# Patient Record
Sex: Female | Born: 1935
Health system: Southern US, Community
[De-identification: ages and names within clinical notes are randomized; demographics above are authoritative.]

## PROBLEM LIST (undated history)

## (undated) DIAGNOSIS — K22719 Barrett's esophagus with dysplasia, unspecified: Secondary | ICD-10-CM

## (undated) DIAGNOSIS — E119 Type 2 diabetes mellitus without complications: Secondary | ICD-10-CM

## (undated) DIAGNOSIS — E079 Disorder of thyroid, unspecified: Secondary | ICD-10-CM

## (undated) DIAGNOSIS — K219 Gastro-esophageal reflux disease without esophagitis: Secondary | ICD-10-CM

## (undated) DIAGNOSIS — I10 Essential (primary) hypertension: Secondary | ICD-10-CM

## (undated) DIAGNOSIS — K220 Achalasia of cardia: Secondary | ICD-10-CM

## (undated) DIAGNOSIS — C649 Malignant neoplasm of unspecified kidney, except renal pelvis: Secondary | ICD-10-CM

## (undated) DIAGNOSIS — N289 Disorder of kidney and ureter, unspecified: Secondary | ICD-10-CM

## (undated) DIAGNOSIS — E785 Hyperlipidemia, unspecified: Secondary | ICD-10-CM

## (undated) DIAGNOSIS — A4902 Methicillin resistant Staphylococcus aureus infection, unspecified site: Secondary | ICD-10-CM

## (undated) HISTORY — DX: Disorder of thyroid, unspecified: E07.9

## (undated) HISTORY — PX: OTHER SURGICAL HISTORY: SHX169

## (undated) HISTORY — PX: ABDOMINAL HYSTERECTOMY: SHX81

## (undated) HISTORY — DX: Barrett's esophagus with dysplasia, unspecified: K22.719

## (undated) HISTORY — DX: Gastro-esophageal reflux disease without esophagitis: K21.9

## (undated) HISTORY — PX: APPENDECTOMY: SHX54

## (undated) HISTORY — DX: Hyperlipidemia, unspecified: E78.5

## (undated) HISTORY — PX: TONSILLECTOMY: SUR1361

## (undated) HISTORY — PX: THYROID EXPLORATION: SHX5280

## (undated) HISTORY — DX: Malignant neoplasm of unspecified kidney, except renal pelvis: C64.9

## (undated) HISTORY — PX: PARATHYROIDECTOMY: SHX19

## (undated) HISTORY — PX: HIATAL HERNIA REPAIR: SHX195

## (undated) HISTORY — DX: Achalasia of cardia: K22.0

---

## 2004-04-23 ENCOUNTER — Ambulatory Visit: Payer: Self-pay

## 2009-06-17 ENCOUNTER — Ambulatory Visit: Payer: Self-pay | Admitting: Family Medicine

## 2009-06-25 ENCOUNTER — Ambulatory Visit: Payer: Self-pay | Admitting: Family Medicine

## 2013-04-10 ENCOUNTER — Ambulatory Visit: Payer: Self-pay | Admitting: Unknown Physician Specialty

## 2013-04-12 LAB — PATHOLOGY REPORT

## 2013-05-11 DIAGNOSIS — R1310 Dysphagia, unspecified: Secondary | ICD-10-CM | POA: Insufficient documentation

## 2013-05-31 ENCOUNTER — Ambulatory Visit: Payer: Self-pay | Admitting: Gastroenterology

## 2013-07-04 ENCOUNTER — Ambulatory Visit: Payer: Self-pay | Admitting: Family Medicine

## 2014-06-06 ENCOUNTER — Emergency Department: Payer: PPO

## 2014-06-06 ENCOUNTER — Encounter: Payer: Self-pay | Admitting: Urgent Care

## 2014-06-06 ENCOUNTER — Emergency Department
Admission: EM | Admit: 2014-06-06 | Discharge: 2014-06-06 | Disposition: A | Discharge status: Home / Self Care | Payer: PPO | Attending: Student | Admitting: Student

## 2014-06-06 DIAGNOSIS — R109 Unspecified abdominal pain: Secondary | ICD-10-CM | POA: Diagnosis not present

## 2014-06-06 DIAGNOSIS — I10 Essential (primary) hypertension: Secondary | ICD-10-CM | POA: Diagnosis not present

## 2014-06-06 DIAGNOSIS — M549 Dorsalgia, unspecified: Secondary | ICD-10-CM | POA: Diagnosis not present

## 2014-06-06 DIAGNOSIS — E119 Type 2 diabetes mellitus without complications: Secondary | ICD-10-CM | POA: Insufficient documentation

## 2014-06-06 HISTORY — DX: Essential (primary) hypertension: I10

## 2014-06-06 HISTORY — DX: Type 2 diabetes mellitus without complications: E11.9

## 2014-06-06 LAB — URINALYSIS COMPLETE WITH MICROSCOPIC (ARMC ONLY)
BILIRUBIN URINE: NEGATIVE
Bacteria, UA: NONE SEEN
GLUCOSE, UA: NEGATIVE mg/dL
Hgb urine dipstick: NEGATIVE
KETONES UR: NEGATIVE mg/dL
Leukocytes, UA: NEGATIVE
Nitrite: NEGATIVE
PH: 7 (ref 5.0–8.0)
PROTEIN: NEGATIVE mg/dL
SPECIFIC GRAVITY, URINE: 1.015 (ref 1.005–1.030)

## 2014-06-06 MED ORDER — TRAMADOL HCL 50 MG PO TABS
50.0000 mg | ORAL_TABLET | Freq: Once | ORAL | Status: AC
  Administered 2014-06-06: 50 mg via ORAL

## 2014-06-06 MED ORDER — TRAMADOL HCL 50 MG PO TABS: 50.0000 mg | ORAL_TABLET | Freq: Four times a day (QID) | ORAL | Status: DC | PRN

## 2014-06-06 MED ORDER — CYCLOBENZAPRINE HCL 5 MG PO TABS: 5.0000 mg | ORAL_TABLET | Freq: Three times a day (TID) | ORAL | Status: AC | PRN

## 2014-06-06 MED ORDER — TRAMADOL HCL 50 MG PO TABS
ORAL_TABLET | ORAL | Status: AC
  Filled 2014-06-06: qty 1

## 2014-06-06 NOTE — Discharge Instructions (Signed)
Flank Pain °Flank pain refers to pain that is located on the side of the body between the upper abdomen and the back. The pain may occur over a short period of time (acute) or may be long-term or reoccurring (chronic). It may be mild or severe. Flank pain can be caused by many things. °CAUSES  °Some of the more common causes of flank pain include: °· Muscle strains.   °· Muscle spasms.   °· A disease of your spine (vertebral disk disease).   °· A lung infection (pneumonia).   °· Fluid around your lungs (pulmonary edema).   °· A kidney infection.   °· Kidney stones.   °· A very painful skin rash caused by the chickenpox virus (shingles).   °· Gallbladder disease.   °HOME CARE INSTRUCTIONS  °Home care will depend on the cause of your pain. In general, °· Rest as directed by your caregiver. °· Drink enough fluids to keep your urine clear or pale yellow. °· Only take over-the-counter or prescription medicines as directed by your caregiver. Some medicines may help relieve the pain. °· Tell your caregiver about any changes in your pain. °· Follow up with your caregiver as directed. °SEEK IMMEDIATE MEDICAL CARE IF:  °· Your pain is not controlled with medicine.   °· You have new or worsening symptoms. °· Your pain increases.   °· You have abdominal pain.   °· You have shortness of breath.   °· You have persistent nausea or vomiting.   °· You have swelling in your abdomen.   °· You feel faint or pass out.   °· You have blood in your urine. °· You have a fever or persistent symptoms for more than 2-3 days. °· You have a fever and your symptoms suddenly get worse. °MAKE SURE YOU:  °· Understand these instructions. °· Will watch your condition. °· Will get help right away if you are not doing well or get worse. °Document Released: 02/12/2005 Document Revised: 09/16/2011 Document Reviewed: 08/06/2011 °ExitCare® Patient Information ©2015 [employer]. This information is not intended to replace advice given to you by your  health care provider. Make sure you discuss any questions you have with your health care provider. ° °

## 2014-06-06 NOTE — ED Notes (Signed)
Patient presents with c/o RIGHT mid to lower back pain that has been going on for several days, worse last night around 1700. Patient denies N/V/D/F and urinary symptoms. Patient advises that pain is worsened with deep inspiration.

## 2014-06-06 NOTE — ED Notes (Signed)
Past few days c/o right lateral back, hurts to breathe, sharp like pain,no urinary symptoms, lungs clear

## 2014-06-06 NOTE — ED Provider Notes (Signed)
[facility] Emergency Department Provider Note  ____________________________________________  Time seen: Approximately 7:33 AM  I have reviewed the triage vital signs and the nursing notes.   HISTORY  Chief Complaint Back Pain    HPI Deanna Bird is a 81 y.o. female plane of increasing left flank pain for 3 days. Patient stated the pain increase around 7:00 last night she denies any urinary complaints. Denies any fever patient stated pain increases with deep respirations. Patient denies any dyspnea. Patient is rating her pain as a 8/10.   Past Medical History  Diagnosis Date  . Diabetes mellitus without complication   . Hypertension     There are no active problems to display for this patient.   Past Surgical History  Procedure Laterality Date  . Abdominal hysterectomy      Current Outpatient Rx  Name  Route  Sig  Dispense  Refill  . cyclobenzaprine (FLEXERIL) 5 MG tablet   Oral   Take 1 tablet (5 mg total) by mouth every 8 (eight) hours as needed for muscle spasms.   15 tablet   1   . traMADol (ULTRAM) 50 MG tablet   Oral   Take 1 tablet (50 mg total) by mouth every 6 (six) hours as needed for moderate pain.   12 tablet   0     Allergies Review of patient's allergies indicates no known allergies.  No family history on file.  Social History History  Substance Use Topics  . Smoking status: Never Smoker   . Smokeless tobacco: Not on file  . Alcohol Use: No    Review of Systems Constitutional: No fever/chills Eyes: No visual changes. ENT: No sore throat. Cardiovascular: Denies chest pain. Respiratory: Denies shortness of breath. Gastrointestinal: No abdominal pain.  No nausea, no vomiting.  No diarrhea.  No constipation. Genitourinary: Negative for dysuria. Musculoskeletal: Flank pain Skin: Negative for rash. Neurological: Negative for headaches, focal weakness or numbness. Endocrine:Diabetes and  hypertension Hematological/Lymphatic:  10-point ROS otherwise negative.  ____________________________________________   PHYSICAL EXAM:  VITAL SIGNS: ED Triage Vitals  Enc Vitals Group     BP 06/06/14 0551 158/69 mmHg     Pulse Rate 06/06/14 0551 70     Resp 06/06/14 0551 18     Temp 06/06/14 0551 97.6 F (36.4 C)     Temp Source 06/06/14 0551 Oral     SpO2 06/06/14 0551 98 %     Weight 06/06/14 0551 160 lb (72.576 kg)     Height 06/06/14 0551 5\' 4"  (1.626 m)     Head Cir --      Peak Flow --      Pain Score 06/06/14 0551 8     Pain Loc --      Pain Edu? --      Excl. in [place]? --     Constitutional: Alert and oriented. On his distress. Eyes: Conjunctivae are normal. PERRL. EOMI. Head: Atraumatic. Nose: No congestion/rhinnorhea. Mouth/Throat: Mucous membranes are moist.  Oropharynx non-erythematous. Neck: No stridor. No cervical deformity for nuchal range of motion nontender palpation. Hematological/Lymphatic/Immunilogical: No cervical lymphadenopathy. Cardiovascular: Normal rate, regular rhythm. Grossly normal heart sounds.  Good peripheral circulation. Respiratory: Normal respiratory effort.  No retractions. Lungs CTAB. Guarding with deep inspirations. Gastrointestinal: Soft and nontender. No distention. No abdominal bruits. No CVA tenderness. Musculoskeletal: No lower extremity tenderness nor edema.  No joint effusions. Guarding with palpation of the right CVA area.  Neurologic:  Normal speech and language. No gross  focal neurologic deficits are appreciated. Speech is normal. No gait instability. Skin:  Skin is warm, dry and intact. No rash noted. Psychiatric: Mood and affect are normal. Speech and behavior are normal.  ____________________________________________   LABS (all labs ordered are listed, but only abnormal results are displayed)  Labs Reviewed  URINALYSIS COMPLETEWITH MICROSCOPIC ([place] ONLY) - Abnormal; Notable for the following:    Color, Urine YELLOW  (*)    APPearance CLEAR (*)    Squamous Epithelial / LPF 0-5 (*)    All other components within normal limits   ____________________________________________  EKG   ____________________________________________  RADIOLOGY   __________ no acute findings on chest x-ray __________________________________   PROCEDURES  Procedure(s) performed: None  Critical Care performed: No  ____________________________________________   INITIAL IMPRESSION / ASSESSMENT AND PLAN / ED COURSE  Pertinent labs & imaging results that were available during my care of the patient were reviewed by me and considered in my medical decision making (see chart for details).  Right flank pain ____________________________________________   FINAL CLINICAL IMPRESSION(S) / ED DIAGNOSES  Final diagnoses:  Right flank pain      Sable Feil, PA-C 06/06/14 0383  Joanne Gavel, MD 06/06/14 1444

## 2014-07-04 ENCOUNTER — Other Ambulatory Visit: Payer: Self-pay | Admitting: Family Medicine

## 2014-07-18 ENCOUNTER — Ambulatory Visit (INDEPENDENT_AMBULATORY_CARE_PROVIDER_SITE_OTHER): Payer: PPO | Admitting: Family Medicine

## 2014-07-18 ENCOUNTER — Encounter: Payer: Self-pay | Admitting: Family Medicine

## 2014-07-18 VITALS — BP 133/73 | HR 71 | Temp 98.1°F | Ht 62.6 in | Wt 164.0 lb

## 2014-07-18 DIAGNOSIS — E119 Type 2 diabetes mellitus without complications: Secondary | ICD-10-CM | POA: Insufficient documentation

## 2014-07-18 DIAGNOSIS — I10 Essential (primary) hypertension: Secondary | ICD-10-CM | POA: Diagnosis not present

## 2014-07-18 DIAGNOSIS — R946 Abnormal results of thyroid function studies: Secondary | ICD-10-CM | POA: Insufficient documentation

## 2014-07-18 LAB — BAYER DCA HB A1C WAIVED: HB A1C (BAYER DCA - WAIVED): 6 % (ref <7.0)

## 2014-07-18 MED ORDER — GLIMEPIRIDE 1 MG PO TABS: 2.0000 mg | ORAL_TABLET | Freq: Every day | ORAL | Status: DC

## 2014-07-18 MED ORDER — METOPROLOL SUCCINATE ER 100 MG PO TB24: 100.0000 mg | ORAL_TABLET | Freq: Every day | ORAL | Status: DC

## 2014-07-18 MED ORDER — LOSARTAN POTASSIUM-HCTZ 100-25 MG PO TABS: 1.0000 | ORAL_TABLET | Freq: Every day | ORAL | Status: DC

## 2014-07-18 MED ORDER — FENOFIBRIC ACID 105 MG PO TABS: 1.0000 | ORAL_TABLET | Freq: Every day | ORAL | Status: DC

## 2014-07-18 NOTE — Assessment & Plan Note (Signed)
The current medical regimen is effective;  continue present plan and medications.  

## 2014-07-18 NOTE — Progress Notes (Signed)
BP 133/73 mmHg  Pulse 71  Temp(Src) 98.1 F (36.7 C)  Ht 5' 2.6" (1.59 m)  Wt 164 lb (74.39 kg)  BMI 29.43 kg/m2  SpO2 99%   Subjective:    Patient ID: Deanna Bird, female    DOB: 06-03-1928, 81 y.o.   MRN: [id number]  HPI: Deanna Bird is a 81 y.o. female  Chief Complaint  Patient presents with  . Diabetes  . TSH bloodwork  Doing well with all medicine. Takes everyday with no side effects. Stable from last visit. Medical problems reviewed and stable No low glu spells No thyroid sx BP doing well  Relevant past medical, surgical, family and social history reviewed and updated as indicated. Interim medical history since our last visit reviewed. Allergies and medications reviewed and updated.  Review of Systems  Constitutional: Negative.   Respiratory: Negative.   Cardiovascular: Negative.     Per HPI unless specifically indicated above     Objective:    BP 133/73 mmHg  Pulse 71  Temp(Src) 98.1 F (36.7 C)  Ht 5' 2.6" (1.59 m)  Wt 164 lb (74.39 kg)  BMI 29.43 kg/m2  SpO2 99%  Wt Readings from Last 3 Encounters:  07/18/14 164 lb (74.39 kg)  04/03/14 168 lb (76.204 kg)  06/06/14 160 lb (72.576 kg)    Physical Exam  Constitutional: She is oriented to person, place, and time. She appears well-developed and well-nourished. No distress.  HENT:  Head: Normocephalic and atraumatic.  Right Ear: Hearing normal.  Left Ear: Hearing normal.  Nose: Nose normal.  Eyes: Conjunctivae and lids are normal. Right eye exhibits no discharge. Left eye exhibits no discharge. No scleral icterus.  Cardiovascular: Normal rate, regular rhythm and normal heart sounds.   Pulmonary/Chest: Effort normal and breath sounds normal. No respiratory distress.  Musculoskeletal: Normal range of motion.  Neurological: She is alert and oriented to person, place, and time.  Skin: Skin is intact. No rash noted.  Psychiatric: She has a normal mood and affect. Her speech is normal and  behavior is normal. Judgment and thought content normal. Cognition and memory are normal.    Results for orders placed or performed during the hospital encounter of 06/06/14  Urinalysis complete, with microscopic [facility])  Result Value Ref Range   Color, Urine YELLOW (A) YELLOW   APPearance CLEAR (A) CLEAR   Glucose, UA NEGATIVE NEGATIVE mg/dL   Bilirubin Urine NEGATIVE NEGATIVE   Ketones, ur NEGATIVE NEGATIVE mg/dL   Specific Gravity, Urine 1.015 1.005 - 1.030   Hgb urine dipstick NEGATIVE NEGATIVE   pH 7.0 5.0 - 8.0   Protein, ur NEGATIVE NEGATIVE mg/dL   Nitrite NEGATIVE NEGATIVE   Leukocytes, UA NEGATIVE NEGATIVE   RBC / HPF 0-5 0 - 5 RBC/hpf   WBC, UA 0-5 0 - 5 WBC/hpf   Bacteria, UA NONE SEEN NONE SEEN   Squamous Epithelial / LPF 0-5 (A) NONE SEEN   Mucous PRESENT       Assessment & Plan:   Problem List Items Addressed This Visit      Cardiovascular and Mediastinum   Hypertension    The current medical regimen is effective;  continue present plan and medications.       Relevant Medications   metoprolol succinate (TOPROL-XL) 100 MG 24 hr tablet   losartan-hydrochlorothiazide (HYZAAR) 100-25 MG per tablet   Fenofibric Acid 105 MG TABS     Endocrine   Diabetes mellitus without complication - Primary    The  current medical regimen is effective;  continue present plan and medications.       Relevant Medications   losartan-hydrochlorothiazide (HYZAAR) 100-25 MG per tablet   glimepiride (AMARYL) 1 MG tablet   Fenofibric Acid 105 MG TABS   Other Relevant Orders   Bayer DCA Hb A1c Waived     Other   Nonspecific abnormal results of thyroid function study   Relevant Orders   TSH       Follow up plan: Return in about 3 months (around 10/18/2014), or if symptoms worsen or fail to improve, for recheck BMP, lipids, alt, ast, A1C.

## 2014-07-19 LAB — TSH: TSH: 4.66 u[IU]/mL — AB (ref 0.450–4.500)

## 2014-07-30 ENCOUNTER — Telehealth: Payer: Self-pay

## 2014-07-30 NOTE — Telephone Encounter (Signed)
Reviewed with patient

## 2014-07-30 NOTE — Telephone Encounter (Signed)
Pt would like Deanna Bird to call her with her lab results. Thanks.

## 2014-07-30 NOTE — Telephone Encounter (Signed)
TSH is just fine

## 2014-11-19 ENCOUNTER — Ambulatory Visit (INDEPENDENT_AMBULATORY_CARE_PROVIDER_SITE_OTHER): Payer: PPO | Admitting: Family Medicine

## 2014-11-19 ENCOUNTER — Encounter: Payer: Self-pay | Admitting: Family Medicine

## 2014-11-19 VITALS — BP 135/73 | HR 60 | Temp 98.1°F | Ht 63.0 in | Wt 167.0 lb

## 2014-11-19 DIAGNOSIS — I10 Essential (primary) hypertension: Secondary | ICD-10-CM

## 2014-11-19 DIAGNOSIS — Z23 Encounter for immunization: Secondary | ICD-10-CM

## 2014-11-19 DIAGNOSIS — E785 Hyperlipidemia, unspecified: Secondary | ICD-10-CM

## 2014-11-19 DIAGNOSIS — E119 Type 2 diabetes mellitus without complications: Secondary | ICD-10-CM | POA: Diagnosis not present

## 2014-11-19 LAB — LP+ALT+AST PICCOLO, WAIVED
ALT (SGPT) Piccolo, Waived: 22 U/L (ref 10–47)
AST (SGOT) PICCOLO, WAIVED: 29 U/L (ref 11–38)
Chol/HDL Ratio Piccolo,Waive: 4.8 mg/dL
Cholesterol Piccolo, Waived: 135 mg/dL (ref <200)
HDL CHOL PICCOLO, WAIVED: 28 mg/dL — AB (ref >59)
LDL CHOL CALC PICCOLO WAIVED: 49 mg/dL (ref <100)
Triglycerides Piccolo,Waived: 287 mg/dL — ABNORMAL HIGH (ref <150)
VLDL Chol Calc Piccolo,Waive: 57 mg/dL — ABNORMAL HIGH (ref <30)

## 2014-11-19 LAB — MICROALBUMIN, URINE WAIVED
CREATININE, URINE WAIVED: 200 mg/dL (ref 10–300)
MICROALB, UR WAIVED: 10 mg/L (ref 0–19)
Microalb/Creat Ratio: <30 mg/g (ref <30)

## 2014-11-19 LAB — BAYER DCA HB A1C WAIVED: HB A1C (BAYER DCA - WAIVED): 6.1 % (ref <7.0)

## 2014-11-19 NOTE — Assessment & Plan Note (Signed)
Discuss elevated blood pressure  Discussed diet exercise nutrition modest salt Discussed following blood pressure and if not getting better will need to reevaluate

## 2014-11-19 NOTE — Assessment & Plan Note (Signed)
The current medical regimen is effective;  continue present plan and medications.  

## 2014-11-19 NOTE — Progress Notes (Signed)
BP 135/73 mmHg  Pulse 60  Temp(Src) 98.1 F (36.7 C)  Ht 5\' 3"  (1.6 m)  Wt 167 lb (75.751 kg)  BMI 29.59 kg/m2  SpO2 99%   Subjective:    Patient ID: Deanna Bird, female    DOB: 07-19-1928, 81 y.o.   MRN: [id number]  HPI: Deanna Bird is a 81 y.o. female  Chief Complaint  Patient presents with  . Diabetes  . Hyperlipidemia  . Hypertension   patient having fatigue spells feels that her blood sugars getting low and at one time her blood sugar was 70 only taken glyburide 1 mg in the morning. Patient has such fatigue that after eating breakfast she has to lay down before she can clean the dishes. No cardiovascular symptoms chest pain chest tightness shortness of breath No leg swelling no nighttime shortness of breath  Fino fabric acid doing well for triglycerides with no side effects Blood pressure doing okay with losartan hydrochlorothiazide along with metoprolol  Relevant past medical, surgical, family and social history reviewed and updated as indicated. Interim medical history since our last visit reviewed. Allergies and medications reviewed and updated.  Review of Systems  Constitutional: Negative.   Respiratory: Negative.   Cardiovascular: Negative.     Per HPI unless specifically indicated above     Objective:    BP 135/73 mmHg  Pulse 60  Temp(Src) 98.1 F (36.7 C)  Ht 5\' 3"  (1.6 m)  Wt 167 lb (75.751 kg)  BMI 29.59 kg/m2  SpO2 99%  Wt Readings from Last 3 Encounters:  11/19/14 167 lb (75.751 kg)  07/18/14 164 lb (74.39 kg)  04/03/14 168 lb (76.204 kg)    Physical Exam  Constitutional: She is oriented to person, place, and time. She appears well-developed and well-nourished. No distress.  HENT:  Head: Normocephalic and atraumatic.  Right Ear: Hearing normal.  Left Ear: Hearing normal.  Nose: Nose normal.  Eyes: Conjunctivae and lids are normal. Right eye exhibits no discharge. Left eye exhibits no discharge. No scleral icterus.  Cardiovascular:  Normal rate, regular rhythm and normal heart sounds.   Pulmonary/Chest: Effort normal and breath sounds normal. No respiratory distress.  Abdominal: Bowel sounds are normal.  Musculoskeletal: Normal range of motion.  Neurological: She is alert and oriented to person, place, and time.  Skin: Skin is intact. No rash noted.  Psychiatric: She has a normal mood and affect. Her speech is normal and behavior is normal. Judgment and thought content normal. Cognition and memory are normal.    Results for orders placed or performed in visit on 07/18/14  Bayer DCA Hb A1c Waived  Result Value Ref Range   Bayer DCA Hb A1c Waived 6.0 <7.0 %  TSH  Result Value Ref Range   TSH 4.660 (H) 0.450 - 4.500 uIU/mL      Assessment & Plan:   Problem List Items Addressed This Visit      Cardiovascular and Mediastinum   Hypertension    Discuss elevated blood pressure  Discussed diet exercise nutrition modest salt Discussed following blood pressure and if not getting better will need to reevaluate        Endocrine   Diabetes mellitus without complication ([place]) - Primary    The current medical regimen is effective;  continue present plan and medications.       Relevant Orders   Bayer DCA Hb A1c Waived     Other   Hyperlipidemia    The current medical regimen is effective;  continue present plan and medications.        Other Visit Diagnoses    Hyperlipemia        Relevant Orders    LP+ALT+AST Piccolo, Waived    Essential hypertension, benign        Relevant Orders    Microalbumin, Urine Waived    Basic metabolic panel    Immunization due        Relevant Orders    Flu Vaccine QUAD 36+ mos PF IM (Fluarix & Fluzone Quad PF) (Completed)        Follow up plan: Return in about 3 months (around 02/19/2015) for Physical Exam March.

## 2014-11-20 ENCOUNTER — Encounter: Payer: Self-pay | Admitting: Family Medicine

## 2014-11-20 LAB — BASIC METABOLIC PANEL
BUN/Creatinine Ratio: 22 (ref 11–26)
BUN: 24 mg/dL (ref 8–27)
CO2: 25 mmol/L (ref 18–29)
Calcium: 9.7 mg/dL (ref 8.7–10.3)
Chloride: 99 mmol/L (ref 97–106)
Creatinine, Ser: 1.1 mg/dL — ABNORMAL HIGH (ref 0.57–1.00)
GFR, EST AFRICAN AMERICAN: 53 mL/min/{1.73_m2} — AB (ref >59)
GFR, EST NON AFRICAN AMERICAN: 46 mL/min/{1.73_m2} — AB (ref >59)
Glucose: 175 mg/dL — ABNORMAL HIGH (ref 65–99)
POTASSIUM: 3.9 mmol/L (ref 3.5–5.2)
SODIUM: 142 mmol/L (ref 136–144)

## 2015-01-28 ENCOUNTER — Encounter: Payer: Self-pay | Admitting: Family Medicine

## 2015-01-28 ENCOUNTER — Ambulatory Visit (INDEPENDENT_AMBULATORY_CARE_PROVIDER_SITE_OTHER): Payer: PPO | Admitting: Family Medicine

## 2015-01-28 VITALS — BP 134/71 | HR 76 | Temp 98.4°F | Ht 63.0 in | Wt 162.0 lb

## 2015-01-28 DIAGNOSIS — M546 Pain in thoracic spine: Secondary | ICD-10-CM

## 2015-01-28 DIAGNOSIS — R062 Wheezing: Secondary | ICD-10-CM

## 2015-01-28 MED ORDER — ALBUTEROL SULFATE HFA 108 (90 BASE) MCG/ACT IN AERS: 2.0000 | INHALATION_SPRAY | Freq: Four times a day (QID) | RESPIRATORY_TRACT | Status: DC | PRN

## 2015-01-28 MED ORDER — HYDROCOD POLST-CPM POLST ER 10-8 MG/5ML PO SUER: 5.0000 mL | Freq: Every evening | ORAL | Status: DC | PRN

## 2015-01-28 NOTE — Progress Notes (Signed)
BP 134/71 mmHg  Pulse 76  Temp(Src) 98.4 F (36.9 C)  Ht 5\' 3"  (1.6 m)  Wt 162 lb (73.483 kg)  BMI 28.70 kg/m2  SpO2 95%   Subjective:    Patient ID: Deanna Bird, female    DOB: 1928-12-21, 82 y.o.   MRN: [id number]  HPI: Deanna Bird is a 82 y.o. female  Chief Complaint  Patient presents with  . Cough    since Thanksgiving  . Back Pain    overall back, better since Saturday   UPPER RESPIRATORY TRACT INFECTION Duration: 2 months now off and on Worst symptom: cough, productive of white phlegm Fever: no Cough: yes Shortness of breath: no Wheezing: no Chest pain: no Chest tightness: no Chest congestion: yes Nasal congestion: no Runny nose: yes Post nasal drip: yes Sneezing: yes Sore throat: no Swollen glands: yes Sinus pressure: yes Headache: yes Face pain: yes Toothache: no Ear pain: no  Ear pressure: yes outer part of her ear Eyes red/itching:no Eye drainage/crusting: no  Vomiting: yes Rash: no Fatigue: yes Sick contacts: no Strep contacts: no  Context: better Recurrent sinusitis: no Relief with OTC cold/cough medications: no  Treatments attempted: corecitin, tylenol   BACK PAIN Duration: 3 weeks Mechanism of injury: unknown Location: every where Onset: gradual Severity: 6/10 Quality: sharp Frequency: intermittent, comes on very quickly Radiation: none Aggravating factors: none Alleviating factors: rest and APAP Status: stable Treatments attempted: rest, heat and APAP  Relief with NSAIDs?: No NSAIDs Taken Nighttime pain:  no Paresthesias / decreased sensation:  no Bowel / bladder incontinence:  no Fevers:  no Dysuria / urinary frequency:  no   Relevant past medical, surgical, family and social history reviewed and updated as indicated. Interim medical history since our last visit reviewed. Allergies and medications reviewed and updated.  Review of Systems  Constitutional: Negative.   HENT: Positive for congestion, postnasal drip,  rhinorrhea, sinus pressure, sneezing and sore throat. Negative for dental problem, drooling, ear discharge, ear pain, facial swelling, hearing loss, mouth sores, nosebleeds, tinnitus, trouble swallowing and voice change.   Eyes: Negative.   Respiratory: Positive for cough. Negative for apnea, choking, chest tightness, shortness of breath, wheezing and stridor.   Cardiovascular: Negative.   Musculoskeletal: Positive for myalgias and back pain. Negative for joint swelling, arthralgias, gait problem, neck pain and neck stiffness.  Psychiatric/Behavioral: Negative.     Per HPI unless specifically indicated above     Objective:    BP 134/71 mmHg  Pulse 76  Temp(Src) 98.4 F (36.9 C)  Ht 5\' 3"  (1.6 m)  Wt 162 lb (73.483 kg)  BMI 28.70 kg/m2  SpO2 95%  Wt Readings from Last 3 Encounters:  01/28/15 162 lb (73.483 kg)  11/19/14 167 lb (75.751 kg)  07/18/14 164 lb (74.39 kg)    Physical Exam  Constitutional: She is oriented to person, place, and time. She appears well-developed and well-nourished. No distress.  HENT:  Head: Normocephalic and atraumatic.  Right Ear: Hearing, tympanic membrane, external ear and ear canal normal.  Left Ear: Hearing, tympanic membrane, external ear and ear canal normal.  Nose: Mucosal edema and rhinorrhea present. No nose lacerations, sinus tenderness, nasal deformity, septal deviation or nasal septal hematoma. No epistaxis.  No foreign bodies. Right sinus exhibits no maxillary sinus tenderness and no frontal sinus tenderness. Left sinus exhibits no maxillary sinus tenderness and no frontal sinus tenderness.  Mouth/Throat: Uvula is midline, oropharynx is clear and moist and mucous membranes are normal. No oropharyngeal exudate.  Eyes: Conjunctivae, EOM and lids are normal. Pupils are equal, round, and reactive to light. Right eye exhibits no discharge. Left eye exhibits no discharge. No scleral icterus.  Neck: Normal range of motion. Neck supple. No JVD present.  No tracheal deviation present. No thyromegaly present.  Cardiovascular: Normal rate, regular rhythm, normal heart sounds and intact distal pulses.  Exam reveals no gallop and no friction rub.   No murmur heard. Pulmonary/Chest: Effort normal. No stridor. No respiratory distress. She has wheezes in the right middle field, the right lower field, the left upper field and the left lower field. She has no rales. She exhibits no tenderness.  Musculoskeletal: Normal range of motion.  Lymphadenopathy:    She has no cervical adenopathy.  Neurological: She is alert and oriented to person, place, and time.  Skin: Skin is warm, dry and intact. No rash noted. She is not diaphoretic. No erythema. No pallor.  Psychiatric: She has a normal mood and affect. Her speech is normal and behavior is normal. Judgment and thought content normal. Cognition and memory are normal.  Nursing note and vitals reviewed.   Results for orders placed or performed in visit on 11/19/14  LP+ALT+AST Piccolo, Waived  Result Value Ref Range   ALT (SGPT) Piccolo, Waived 22 10 - 47 U/L   AST (SGOT) Piccolo, Waived 29 11 - 38 U/L   Cholesterol Piccolo, Waived 135 <200 mg/dL   HDL Chol Piccolo, Waived 28 (L) >59 mg/dL   Triglycerides Piccolo,Waived 287 (H) <150 mg/dL   Chol/HDL Ratio Piccolo,Waive 4.8 mg/dL   LDL Chol Calc Piccolo Waived 49 <100 mg/dL   VLDL Chol Calc Piccolo,Waive 57 (H) <30 mg/dL  Bayer DCA Hb A1c Waived  Result Value Ref Range   Bayer DCA Hb A1c Waived 6.1 <7.0 %  Microalbumin, Urine Waived  Result Value Ref Range   Microalb, Ur Waived 10 0 - 19 mg/L   Creatinine, Urine Waived 200 10 - 300 mg/dL   Microalb/Creat Ratio <30 <30 mg/g  Basic metabolic panel  Result Value Ref Range   Glucose 175 (H) 65 - 99 mg/dL   BUN 24 8 - 27 mg/dL   Creatinine, Ser 1.10 (H) 0.57 - 1.00 mg/dL   GFR calc non Af Amer 46 (L) >59 mL/min/1.73   GFR calc Af Amer 53 (L) >59 mL/min/1.73   BUN/Creatinine Ratio 22 11 - 26   Sodium  142 136 - 144 mmol/L   Potassium 3.9 3.5 - 5.2 mmol/L   Chloride 99 97 - 106 mmol/L   CO2 25 18 - 29 mmol/L   Calcium 9.7 8.7 - 10.3 mg/dL      Assessment & Plan:   Problem List Items Addressed This Visit    None    Visit Diagnoses    Wheezing    -  Primary    Will treat with albuterol and tussionex for comfort and sleep. She will let us know if not getting better or getting worse. Continue to monitor.     Bilateral thoracic back pain        Seems to be due to coughing. Will treat cough and will monitor. She will let us know if not feeling better or feeling worse.         Follow up plan: Return if symptoms worsen or fail to improve.

## 2015-02-13 DIAGNOSIS — E119 Type 2 diabetes mellitus without complications: Secondary | ICD-10-CM | POA: Diagnosis not present

## 2015-02-22 ENCOUNTER — Telehealth: Payer: Self-pay | Admitting: Family Medicine

## 2015-02-22 NOTE — Telephone Encounter (Signed)
Pt would like to have something for a bad cold/sinus sent to [facility]. She stated she had came in to see Dr Wynetta Emery and she was getting better but now it has came back.

## 2015-02-23 ENCOUNTER — Other Ambulatory Visit: Payer: Self-pay | Admitting: Family Medicine

## 2015-02-25 MED ORDER — AZITHROMYCIN 250 MG PO TABS: ORAL_TABLET | ORAL | Status: DC

## 2015-03-04 ENCOUNTER — Telehealth: Payer: Self-pay | Admitting: Family Medicine

## 2015-03-04 NOTE — Telephone Encounter (Signed)
Pt called stated she is still blowing thick yellow stuff out of her nose, stated cold is all in her head not her chest. Wants to know if a stronger antibiotic can be called in for her. Pharm is [facility]. Thanks.   Please call pt if any information please call pt directly.

## 2015-03-05 MED ORDER — AMOXICILLIN-POT CLAVULANATE 875-125 MG PO TABS: 1.0000 | ORAL_TABLET | Freq: Two times a day (BID) | ORAL | Status: DC

## 2015-04-09 ENCOUNTER — Encounter: Payer: Self-pay | Admitting: Family Medicine

## 2015-04-09 ENCOUNTER — Ambulatory Visit (INDEPENDENT_AMBULATORY_CARE_PROVIDER_SITE_OTHER): Payer: PPO | Admitting: Family Medicine

## 2015-04-09 VITALS — BP 118/67 | HR 60 | Temp 97.9°F | Ht 63.5 in | Wt 158.0 lb

## 2015-04-09 DIAGNOSIS — E119 Type 2 diabetes mellitus without complications: Secondary | ICD-10-CM

## 2015-04-09 DIAGNOSIS — Z0000 Encounter for general adult medical examination without abnormal findings: Secondary | ICD-10-CM

## 2015-04-09 DIAGNOSIS — E785 Hyperlipidemia, unspecified: Secondary | ICD-10-CM | POA: Diagnosis not present

## 2015-04-09 DIAGNOSIS — I10 Essential (primary) hypertension: Secondary | ICD-10-CM | POA: Diagnosis not present

## 2015-04-09 LAB — MICROSCOPIC EXAMINATION

## 2015-04-09 LAB — URINALYSIS, ROUTINE W REFLEX MICROSCOPIC
Bilirubin, UA: NEGATIVE
GLUCOSE, UA: NEGATIVE
KETONES UA: NEGATIVE
Nitrite, UA: NEGATIVE
PH UA: 6.5 (ref 5.0–7.5)
PROTEIN UA: NEGATIVE
RBC, UA: NEGATIVE
SPEC GRAV UA: 1.015 (ref 1.005–1.030)
Urobilinogen, Ur: 0.2 mg/dL (ref 0.2–1.0)

## 2015-04-09 LAB — BAYER DCA HB A1C WAIVED: HB A1C (BAYER DCA - WAIVED): 5.9 % (ref <7.0)

## 2015-04-09 MED ORDER — GLIMEPIRIDE 1 MG PO TABS: 2.0000 mg | ORAL_TABLET | Freq: Every day | ORAL | Status: DC

## 2015-04-09 MED ORDER — FENOFIBRIC ACID 105 MG PO TABS: 1.0000 | ORAL_TABLET | Freq: Every day | ORAL | Status: DC

## 2015-04-09 MED ORDER — LOSARTAN POTASSIUM-HCTZ 100-25 MG PO TABS: 1.0000 | ORAL_TABLET | Freq: Every day | ORAL | Status: DC

## 2015-04-09 NOTE — Assessment & Plan Note (Signed)
The current medical regimen is effective;  continue present plan and medications.  

## 2015-04-09 NOTE — Progress Notes (Signed)
BP 118/67 mmHg  Pulse 60  Temp(Src) 97.9 F (36.6 C)  Ht 5' 3.5" (1.613 m)  Wt 158 lb (71.668 kg)  BMI 27.55 kg/m2  SpO2 99%   Subjective:    Patient ID: Deanna Bird, female    DOB: 1928/03/17, 82 y.o.   MRN: [id number]  HPI: Deanna Bird is a 82 y.o. female  Chief Complaint  Patient presents with  . Annual Exam  pt follow-up doing well blood sugars doing well sometimes blood sugars in the 80s in the mornings and feels a little tired tried stopping glyburide in the morning but blood sugar got high and is restarted. Discuss may be taken a half a pill Cholesterol doing well no complaints from medications Blood pressure doing well no complaints medications takes medicines faithfully without problems  Relevant past medical, surgical, family and social history reviewed and updated as indicated. Interim medical history since our last visit reviewed. Allergies and medications reviewed and updated.  Review of Systems  Constitutional: Negative.   HENT: Negative.   Eyes: Negative.   Respiratory: Negative.   Cardiovascular: Negative.   Gastrointestinal: Negative.   Endocrine: Negative.   Genitourinary: Negative.   Musculoskeletal: Negative.   Skin: Negative.   Allergic/Immunologic: Negative.   Neurological: Negative.   Hematological: Negative.   Psychiatric/Behavioral: Negative.     Per HPI unless specifically indicated above     Objective:    BP 118/67 mmHg  Pulse 60  Temp(Src) 97.9 F (36.6 C)  Ht 5' 3.5" (1.613 m)  Wt 158 lb (71.668 kg)  BMI 27.55 kg/m2  SpO2 99%  Wt Readings from Last 3 Encounters:  04/09/15 158 lb (71.668 kg)  01/28/15 162 lb (73.483 kg)  11/19/14 167 lb (75.751 kg)    Physical Exam  Constitutional: She is oriented to person, place, and time. She appears well-developed and well-nourished.  HENT:  Head: Normocephalic and atraumatic.  Right Ear: External ear normal.  Left Ear: External ear normal.  Nose: Nose normal.  Mouth/Throat:  Oropharynx is clear and moist.  Eyes: Conjunctivae and EOM are normal. Pupils are equal, round, and reactive to light.  Neck: Normal range of motion. Neck supple. Carotid bruit is not present.  Cardiovascular: Normal rate, regular rhythm and normal heart sounds.   No murmur heard. Pulmonary/Chest: Effort normal and breath sounds normal. She exhibits no mass. Right breast exhibits no mass, no skin change and no tenderness. Left breast exhibits no mass, no skin change and no tenderness. Breasts are symmetrical.  Abdominal: Soft. Bowel sounds are normal. There is no hepatosplenomegaly.  Musculoskeletal: Normal range of motion.  Neurological: She is alert and oriented to person, place, and time.  Skin: No rash noted.  Psychiatric: She has a normal mood and affect. Her behavior is normal. Judgment and thought content normal.    Results for orders placed or performed in visit on 11/19/14  LP+ALT+AST Piccolo, [employer]  Result Value Ref Range   ALT (SGPT) Piccolo, Waived 22 10 - 47 U/L   AST (SGOT) Piccolo, Waived 29 11 - 38 U/L   Cholesterol Piccolo, Waived 135 <200 mg/dL   HDL Chol Piccolo, Waived 28 (L) >59 mg/dL   Triglycerides Piccolo,Waived 287 (H) <150 mg/dL   Chol/HDL Ratio Piccolo,Waive 4.8 mg/dL   LDL Chol Calc Piccolo Waived 49 <100 mg/dL   VLDL Chol Calc Piccolo,Waive 57 (H) <30 mg/dL  Bayer DCA Hb A1c Waived  Result Value Ref Range   Bayer DCA Hb A1c Waived 6.1 <7.0 %  Microalbumin, Urine Waived  Result Value Ref Range   Microalb, Ur Waived 10 0 - 19 mg/L   Creatinine, Urine Waived 200 10 - 300 mg/dL   Microalb/Creat Ratio <30 <30 mg/g  Basic metabolic panel  Result Value Ref Range   Glucose 175 (H) 65 - 99 mg/dL   BUN 24 8 - 27 mg/dL   Creatinine, Ser 1.10 (H) 0.57 - 1.00 mg/dL   GFR calc non Af Amer 46 (L) >59 mL/min/1.73   GFR calc Af Amer 53 (L) >59 mL/min/1.73   BUN/Creatinine Ratio 22 11 - 26   Sodium 142 136 - 144 mmol/L   Potassium 3.9 3.5 - 5.2 mmol/L   Chloride  99 97 - 106 mmol/L   CO2 25 18 - 29 mmol/L   Calcium 9.7 8.7 - 10.3 mg/dL      Assessment & Plan:   Problem List Items Addressed This Visit      Cardiovascular and Mediastinum   Hypertension - Primary    The current medical regimen is effective;  continue present plan and medications.       Relevant Medications   Fenofibric Acid 105 MG TABS   losartan-hydrochlorothiazide (HYZAAR) 100-25 MG tablet   Other Relevant Orders   Comprehensive metabolic panel   CBC with Differential/Platelet   TSH   Urinalysis, Routine w reflex microscopic (not at [facility])     Endocrine   Diabetes mellitus without complication ([place])    The current medical regimen is effective;  continue present plan and medications.       Relevant Medications   glimepiride (AMARYL) 1 MG tablet   losartan-hydrochlorothiazide (HYZAAR) 100-25 MG tablet   Other Relevant Orders   Bayer DCA Hb A1c Waived   Comprehensive metabolic panel   CBC with Differential/Platelet   TSH   Urinalysis, Routine w reflex microscopic (not at [facility])     Other   Hyperlipidemia    The current medical regimen is effective;  continue present plan and medications.       Relevant Medications   Fenofibric Acid 105 MG TABS   losartan-hydrochlorothiazide (HYZAAR) 100-25 MG tablet   Other Relevant Orders   Lipid panel   CBC with Differential/Platelet   TSH   Urinalysis, Routine w reflex microscopic (not at [facility])    Other Visit Diagnoses    PE (physical exam), annual            Follow up plan: Return in about 3 months (around 07/09/2015) for a1c.

## 2015-04-10 LAB — COMPREHENSIVE METABOLIC PANEL
ALBUMIN: 4.1 g/dL (ref 3.5–4.7)
ALT: 20 IU/L (ref 0–32)
AST: 33 IU/L (ref 0–40)
Albumin/Globulin Ratio: 1.7 (ref 1.2–2.2)
Alkaline Phosphatase: 31 IU/L — ABNORMAL LOW (ref 39–117)
BILIRUBIN TOTAL: 0.5 mg/dL (ref 0.0–1.2)
BUN / CREAT RATIO: 20 (ref 12–28)
BUN: 24 mg/dL (ref 8–27)
CO2: 26 mmol/L (ref 18–29)
CREATININE: 1.2 mg/dL — AB (ref 0.57–1.00)
Calcium: 9.5 mg/dL (ref 8.7–10.3)
Chloride: 99 mmol/L (ref 96–106)
GFR calc non Af Amer: 42 mL/min/{1.73_m2} — ABNORMAL LOW (ref >59)
GFR, EST AFRICAN AMERICAN: 48 mL/min/{1.73_m2} — AB (ref >59)
GLOBULIN, TOTAL: 2.4 g/dL (ref 1.5–4.5)
GLUCOSE: 73 mg/dL (ref 65–99)
Potassium: 4.1 mmol/L (ref 3.5–5.2)
SODIUM: 141 mmol/L (ref 134–144)
TOTAL PROTEIN: 6.5 g/dL (ref 6.0–8.5)

## 2015-04-10 LAB — LIPID PANEL
CHOL/HDL RATIO: 4.4 ratio (ref 0.0–4.4)
CHOLESTEROL TOTAL: 136 mg/dL (ref 100–199)
HDL: 31 mg/dL — ABNORMAL LOW (ref >39)
LDL CALC: 69 mg/dL (ref 0–99)
Triglycerides: 178 mg/dL — ABNORMAL HIGH (ref 0–149)
VLDL Cholesterol Cal: 36 mg/dL (ref 5–40)

## 2015-04-10 LAB — CBC WITH DIFFERENTIAL/PLATELET
Basophils Absolute: 0 10*3/uL (ref 0.0–0.2)
Basos: 0 %
EOS (ABSOLUTE): 0.2 10*3/uL (ref 0.0–0.4)
Eos: 3 %
Hematocrit: 38.7 % (ref 34.0–46.6)
Hemoglobin: 12.5 g/dL (ref 11.1–15.9)
IMMATURE GRANULOCYTES: 0 %
Immature Grans (Abs): 0 10*3/uL (ref 0.0–0.1)
LYMPHS ABS: 1.8 10*3/uL (ref 0.7–3.1)
Lymphs: 26 %
MCH: 28.6 pg (ref 26.6–33.0)
MCHC: 32.3 g/dL (ref 31.5–35.7)
MCV: 89 fL (ref 79–97)
MONOS ABS: 0.3 10*3/uL (ref 0.1–0.9)
Monocytes: 5 %
NEUTROS PCT: 66 %
Neutrophils Absolute: 4.6 10*3/uL (ref 1.4–7.0)
PLATELETS: 237 10*3/uL (ref 150–379)
RBC: 4.37 x10E6/uL (ref 3.77–5.28)
RDW: 15.8 % — AB (ref 12.3–15.4)
WBC: 7 10*3/uL (ref 3.4–10.8)

## 2015-04-10 LAB — TSH: TSH: 3.96 u[IU]/mL (ref 0.450–4.500)

## 2015-04-15 ENCOUNTER — Encounter: Payer: Self-pay | Admitting: Family Medicine

## 2015-05-13 DIAGNOSIS — H35372 Puckering of macula, left eye: Secondary | ICD-10-CM | POA: Diagnosis not present

## 2015-05-13 DIAGNOSIS — H2513 Age-related nuclear cataract, bilateral: Secondary | ICD-10-CM | POA: Diagnosis not present

## 2015-05-13 DIAGNOSIS — E119 Type 2 diabetes mellitus without complications: Secondary | ICD-10-CM | POA: Diagnosis not present

## 2015-05-13 LAB — HM DIABETES EYE EXAM

## 2015-05-22 DIAGNOSIS — M1711 Unilateral primary osteoarthritis, right knee: Secondary | ICD-10-CM | POA: Diagnosis not present

## 2015-06-06 ENCOUNTER — Ambulatory Visit (INDEPENDENT_AMBULATORY_CARE_PROVIDER_SITE_OTHER): Payer: PPO | Admitting: Family Medicine

## 2015-06-06 ENCOUNTER — Ambulatory Visit
Admission: RE | Admit: 2015-06-06 | Discharge: 2015-06-06 | Disposition: A | Discharge status: Home / Self Care | Payer: PPO | Source: Ambulatory Visit | Attending: Family Medicine | Admitting: Family Medicine

## 2015-06-06 ENCOUNTER — Encounter: Payer: Self-pay | Admitting: Family Medicine

## 2015-06-06 VITALS — BP 111/65 | HR 77 | Temp 98.1°F | Ht 63.5 in | Wt 152.0 lb

## 2015-06-06 DIAGNOSIS — I517 Cardiomegaly: Secondary | ICD-10-CM | POA: Insufficient documentation

## 2015-06-06 DIAGNOSIS — R059 Cough, unspecified: Secondary | ICD-10-CM

## 2015-06-06 DIAGNOSIS — R05 Cough: Secondary | ICD-10-CM

## 2015-06-06 DIAGNOSIS — J189 Pneumonia, unspecified organism: Secondary | ICD-10-CM | POA: Insufficient documentation

## 2015-06-06 NOTE — Assessment & Plan Note (Signed)
Nonspecific cough will do chest x-ray. Patient has history of Barrett's esophagitis which hasn't been assessed in a long time may need referral back to GI.

## 2015-06-06 NOTE — Progress Notes (Signed)
   BP 111/65 mmHg  Pulse 77  Temp(Src) 98.1 F (36.7 C)  Ht 5' 3.5" (1.613 m)  Wt 152 lb (68.947 kg)  BMI 26.50 kg/m2  SpO2 98%   Subjective:    Patient ID: Deanna Bird, female    DOB: [birth year], 82 y.o.   MRN: [id number]  HPI: Deanna Bird is a 82 y.o. female  Chief Complaint  Patient presents with  . Cough   Patient with productive cough ongoing all spring didn't seem to have gone away from February round of antibiotics with azithromycin followed by Augmentin. Still will cough up occasional globs of gray sputum. Patient otherwise feeling well. No fever no chills. Has had some decreased appetite especially in the morning and is been losing weight. Taking medications as per med list without problems no low blood pressure spells no low blood sugar spells  Relevant past medical, surgical, family and social history reviewed and updated as indicated. Interim medical history since our last visit reviewed. Allergies and medications reviewed and updated.  Review of Systems  Constitutional: Negative.   Respiratory: Positive for cough and choking. Negative for apnea, chest tightness and shortness of breath.   Cardiovascular: Negative.     Per HPI unless specifically indicated above     Objective:    BP 111/65 mmHg  Pulse 77  Temp(Src) 98.1 F (36.7 C)  Ht 5' 3.5" (1.613 m)  Wt 152 lb (68.947 kg)  BMI 26.50 kg/m2  SpO2 98%  Wt Readings from Last 3 Encounters:  06/06/15 152 lb (68.947 kg)  04/09/15 158 lb (71.668 kg)  01/28/15 162 lb (73.483 kg)    Physical Exam  Constitutional: She is oriented to person, place, and time. She appears well-developed and well-nourished. No distress.  HENT:  Head: Normocephalic and atraumatic.  Right Ear: Hearing normal.  Left Ear: Hearing normal.  Nose: Nose normal.  Eyes: Conjunctivae and lids are normal. Right eye exhibits no discharge. Left eye exhibits no discharge. No scleral icterus.  Cardiovascular: Normal rate, regular rhythm  and normal heart sounds.   Pulmonary/Chest: Effort normal and breath sounds normal. No respiratory distress. She has no wheezes. She has no rales. She exhibits no mass and no tenderness. Right breast exhibits no inverted nipple, no mass, no skin change and no tenderness. Left breast exhibits mass. Left breast exhibits no inverted nipple, no skin change and no tenderness. Breasts are symmetrical.  Musculoskeletal: Normal range of motion.  Foot exam normal with good pulses  Neurological: She is alert and oriented to person, place, and time.  Skin: Skin is intact. No rash noted.  Psychiatric: She has a normal mood and affect. Her speech is normal and behavior is normal. Judgment and thought content normal. Cognition and memory are normal.    Results for orders placed or performed in visit on 05/20/15  HM DIABETES EYE EXAM  Result Value Ref Range   HM Diabetic Eye Exam No Retinopathy No Retinopathy      Assessment & Plan:   Problem List Items Addressed This Visit      Other   Cough - Primary    Nonspecific cough will do chest x-ray. Patient has history of Barrett's esophagitis which hasn't been assessed in a long time may need referral back to GI.      Relevant Orders   DG Chest 2 View       Follow up plan: Return for As scheduled.

## 2015-06-07 ENCOUNTER — Telehealth: Payer: Self-pay | Admitting: Family Medicine

## 2015-06-07 MED ORDER — AMOXICILLIN-POT CLAVULANATE 875-125 MG PO TABS: 1.0000 | ORAL_TABLET | Freq: Two times a day (BID) | ORAL | Status: DC

## 2015-06-07 NOTE — Telephone Encounter (Signed)
Phone call Discussed with patient x-ray consistent with pneumonia with start Augmentin discuss follow-up if not getting better.

## 2015-06-10 DIAGNOSIS — E119 Type 2 diabetes mellitus without complications: Secondary | ICD-10-CM | POA: Diagnosis not present

## 2015-07-01 DIAGNOSIS — H35372 Puckering of macula, left eye: Secondary | ICD-10-CM | POA: Diagnosis not present

## 2015-07-31 ENCOUNTER — Encounter: Payer: Self-pay | Admitting: Family Medicine

## 2015-07-31 ENCOUNTER — Ambulatory Visit (INDEPENDENT_AMBULATORY_CARE_PROVIDER_SITE_OTHER): Payer: PPO | Admitting: Family Medicine

## 2015-07-31 VITALS — BP 120/67 | HR 67 | Temp 97.7°F | Ht 64.5 in | Wt 152.0 lb

## 2015-07-31 DIAGNOSIS — R05 Cough: Secondary | ICD-10-CM

## 2015-07-31 DIAGNOSIS — E119 Type 2 diabetes mellitus without complications: Secondary | ICD-10-CM | POA: Diagnosis not present

## 2015-07-31 DIAGNOSIS — R042 Hemoptysis: Secondary | ICD-10-CM

## 2015-07-31 DIAGNOSIS — I10 Essential (primary) hypertension: Secondary | ICD-10-CM

## 2015-07-31 DIAGNOSIS — R059 Cough, unspecified: Secondary | ICD-10-CM

## 2015-07-31 LAB — BAYER DCA HB A1C WAIVED: HB A1C: 6 % (ref <7.0)

## 2015-07-31 MED ORDER — METOPROLOL SUCCINATE ER 100 MG PO TB24: 100.0000 mg | ORAL_TABLET | Freq: Every day | ORAL | 6 refills | Status: DC

## 2015-07-31 NOTE — Assessment & Plan Note (Signed)
The current medical regimen is effective;  continue present plan and medications.  

## 2015-07-31 NOTE — Assessment & Plan Note (Signed)
Patient with ongoing cough status post pneumonia from chest x-ray Now with hemoptysis 4 days ago. We'll get chest x-ray and pulmonary referral Gave sample of Symbicort 1 puff twice a day

## 2015-07-31 NOTE — Progress Notes (Signed)
BP 120/67 (BP Location: Left Arm, Patient Position: Sitting, Cuff Size: Normal)   Pulse 67   Temp 97.7 F (36.5 C)   Ht 5' 4.5" (1.638 m) Comment: with shoes  Wt 152 lb (68.9 kg) Comment: with shoes  SpO2 99%   BMI 25.69 kg/m    Subjective:    Patient ID: Deanna Bird, female    DOB: 12-11-1928, 82 y.o.   MRN: [id number]  HPI: Deanna Bird is a 82 y.o. female  Chief Complaint  Patient presents with  . Diabetes  Follow-up diabetes doing well no complaints from medications taken faithfully without side effects or low blood sugar spells.  Biggest concern today is follow-up pneumonia patient took antibiotics which was third round of antibiotics this year helped for a while but is been coughing again is coughing spells and 4 days ago or so coughed up some dark blood-tinged sputum did that for 2 days.  Patient otherwise feels fine. Still coughing and sometimes productive area but no further blood.   Relevant past medical, surgical, family and social history reviewed and updated as indicated. Interim medical history since our last visit reviewed. Allergies and medications reviewed and updated.  Review of Systems  Constitutional: Negative.  Negative for chills, diaphoresis, fatigue, fever and unexpected weight change.  HENT: Positive for rhinorrhea. Negative for congestion, nosebleeds, postnasal drip, sinus pressure, sneezing, sore throat, trouble swallowing and voice change.   Respiratory: Positive for cough. Negative for shortness of breath and wheezing.   Cardiovascular: Negative.     Per HPI unless specifically indicated above     Objective:    BP 120/67 (BP Location: Left Arm, Patient Position: Sitting, Cuff Size: Normal)   Pulse 67   Temp 97.7 F (36.5 C)   Ht 5' 4.5" (1.638 m) Comment: with shoes  Wt 152 lb (68.9 kg) Comment: with shoes  SpO2 99%   BMI 25.69 kg/m   Wt Readings from Last 3 Encounters:  07/31/15 152 lb (68.9 kg)  06/06/15 152 lb (68.9 kg)    04/09/15 158 lb (71.7 kg)    Physical Exam  Constitutional: She is oriented to person, place, and time. She appears well-developed and well-nourished. No distress.  HENT:  Head: Normocephalic and atraumatic.  Right Ear: Hearing normal.  Left Ear: Hearing normal.  Nose: Nose normal.  Mouth/Throat: Oropharynx is clear and moist.  Eyes: Conjunctivae and lids are normal. Right eye exhibits no discharge. Left eye exhibits no discharge. No scleral icterus.  Cardiovascular: Normal rate, regular rhythm and normal heart sounds.   Pulmonary/Chest: Effort normal. She has no wheezes. She has no rales. She exhibits no tenderness.  Musculoskeletal: Normal range of motion.  Neurological: She is alert and oriented to person, place, and time.  Skin: Skin is intact. No rash noted.  Psychiatric: She has a normal mood and affect. Her speech is normal and behavior is normal. Judgment and thought content normal. Cognition and memory are normal.    Results for orders placed or performed in visit on 07/31/15  Bayer DCA Hb A1c Waived  Result Value Ref Range   Bayer DCA Hb A1c Waived 6.0 <7.0 %      Assessment & Plan:   Problem List Items Addressed This Visit      Cardiovascular and Mediastinum   Hypertension    The current medical regimen is effective;  continue present plan and medications.       Relevant Medications   metoprolol succinate (TOPROL-XL) 100 MG 24 hr  tablet     Endocrine   Diabetes mellitus without complication ([place]) - Primary    The current medical regimen is effective;  continue present plan and medications.       Relevant Orders   Bayer DCA Hb A1c Waived (Completed)     Other   Cough    Patient with ongoing cough status post pneumonia from chest x-ray Now with hemoptysis 4 days ago. We'll get chest x-ray and pulmonary referral Gave sample of Symbicort 1 puff twice a day      Relevant Orders   DG Chest 2 View   Ambulatory referral to Pulmonology    Other Visit  Diagnoses    Cough with hemoptysis       Relevant Orders   Ambulatory referral to Pulmonology     Follow-up sooner if continued  Follow up plan: Return in about 3 months (around 10/31/2015), or if symptoms worsen or fail to improve, for BMP, lipids, alt, ast, a1c.

## 2015-08-22 ENCOUNTER — Other Ambulatory Visit: Payer: Self-pay | Admitting: Specialist

## 2015-08-22 DIAGNOSIS — R053 Chronic cough: Secondary | ICD-10-CM

## 2015-08-22 DIAGNOSIS — R05 Cough: Secondary | ICD-10-CM | POA: Diagnosis not present

## 2015-08-22 DIAGNOSIS — R042 Hemoptysis: Secondary | ICD-10-CM

## 2015-08-22 DIAGNOSIS — R918 Other nonspecific abnormal finding of lung field: Secondary | ICD-10-CM

## 2015-08-22 DIAGNOSIS — K220 Achalasia of cardia: Secondary | ICD-10-CM | POA: Diagnosis not present

## 2015-08-30 ENCOUNTER — Ambulatory Visit
Admission: RE | Admit: 2015-08-30 | Discharge: 2015-08-30 | Disposition: A | Discharge status: Home / Self Care | Payer: PPO | Source: Ambulatory Visit | Attending: Specialist | Admitting: Specialist

## 2015-08-30 DIAGNOSIS — J479 Bronchiectasis, uncomplicated: Secondary | ICD-10-CM | POA: Diagnosis not present

## 2015-08-30 DIAGNOSIS — I2510 Atherosclerotic heart disease of native coronary artery without angina pectoris: Secondary | ICD-10-CM | POA: Diagnosis not present

## 2015-08-30 DIAGNOSIS — I700 Atherosclerosis of aorta: Secondary | ICD-10-CM | POA: Insufficient documentation

## 2015-08-30 DIAGNOSIS — R918 Other nonspecific abnormal finding of lung field: Secondary | ICD-10-CM | POA: Diagnosis not present

## 2015-08-30 DIAGNOSIS — J219 Acute bronchiolitis, unspecified: Secondary | ICD-10-CM | POA: Insufficient documentation

## 2015-08-30 DIAGNOSIS — R05 Cough: Secondary | ICD-10-CM

## 2015-08-30 DIAGNOSIS — R042 Hemoptysis: Secondary | ICD-10-CM

## 2015-08-30 DIAGNOSIS — J470 Bronchiectasis with acute lower respiratory infection: Secondary | ICD-10-CM | POA: Insufficient documentation

## 2015-08-30 DIAGNOSIS — R053 Chronic cough: Secondary | ICD-10-CM

## 2015-09-05 DIAGNOSIS — J349 Unspecified disorder of nose and nasal sinuses: Secondary | ICD-10-CM | POA: Diagnosis not present

## 2015-09-05 DIAGNOSIS — R05 Cough: Secondary | ICD-10-CM | POA: Diagnosis not present

## 2015-09-05 DIAGNOSIS — J0100 Acute maxillary sinusitis, unspecified: Secondary | ICD-10-CM | POA: Diagnosis not present

## 2015-09-06 DIAGNOSIS — R05 Cough: Secondary | ICD-10-CM | POA: Diagnosis not present

## 2015-09-16 DIAGNOSIS — M1711 Unilateral primary osteoarthritis, right knee: Secondary | ICD-10-CM | POA: Diagnosis not present

## 2015-09-20 ENCOUNTER — Encounter: Payer: Self-pay | Admitting: Family Medicine

## 2015-10-04 DIAGNOSIS — Z22322 Carrier or suspected carrier of Methicillin resistant Staphylococcus aureus: Secondary | ICD-10-CM | POA: Diagnosis not present

## 2015-10-04 DIAGNOSIS — K219 Gastro-esophageal reflux disease without esophagitis: Secondary | ICD-10-CM | POA: Diagnosis not present

## 2015-10-04 DIAGNOSIS — R05 Cough: Secondary | ICD-10-CM | POA: Diagnosis not present

## 2015-10-04 DIAGNOSIS — J479 Bronchiectasis, uncomplicated: Secondary | ICD-10-CM | POA: Diagnosis not present

## 2015-10-07 DIAGNOSIS — E119 Type 2 diabetes mellitus without complications: Secondary | ICD-10-CM | POA: Diagnosis not present

## 2015-10-17 ENCOUNTER — Telehealth: Payer: Self-pay | Admitting: Family Medicine

## 2015-10-17 NOTE — Telephone Encounter (Signed)
Pt called back, stated she needs Vitamin D not Vitamin E checked. Thanks.

## 2015-10-17 NOTE — Telephone Encounter (Signed)
Pt called would like to have her B12 and Vitamin E checked at her next visit. Can these be ordered for her. Thanks.

## 2015-10-18 NOTE — Telephone Encounter (Signed)
Medicare does not pay for Vit D and B12 levels very easily.  This would have to be done under the context of an office visit as we would want to do a more comprehensive evaluation.  Please ask her if her family is aware of her difficulties

## 2015-10-18 NOTE — Telephone Encounter (Signed)
I called patient to see if there was a reason why she wanted these labs.   Patient said she is forgetting things. She cooked [employer] yesterday, forgot and burnt them on the stove.  Patient stated she hasn't been out in the sun in years and was wondering if that had anything to do with it.  She understood that she would have to pay for the labs if insurance didn't cover it.  Explained to patient Dr. Jeananne Rama is out of town but I would route to providers today for their advice. Marking this high priority since patient left a stove on.

## 2015-10-28 ENCOUNTER — Encounter: Payer: Self-pay | Admitting: Family Medicine

## 2015-10-28 ENCOUNTER — Ambulatory Visit (INDEPENDENT_AMBULATORY_CARE_PROVIDER_SITE_OTHER): Payer: PPO | Admitting: Family Medicine

## 2015-10-28 ENCOUNTER — Telehealth: Payer: Self-pay

## 2015-10-28 VITALS — BP 127/73 | HR 67 | Temp 98.0°F | Wt 152.0 lb

## 2015-10-28 DIAGNOSIS — E119 Type 2 diabetes mellitus without complications: Secondary | ICD-10-CM

## 2015-10-28 DIAGNOSIS — E782 Mixed hyperlipidemia: Secondary | ICD-10-CM | POA: Diagnosis not present

## 2015-10-28 DIAGNOSIS — I10 Essential (primary) hypertension: Secondary | ICD-10-CM

## 2015-10-28 DIAGNOSIS — Z7689 Persons encountering health services in other specified circumstances: Secondary | ICD-10-CM | POA: Diagnosis not present

## 2015-10-28 LAB — LP+ALT+AST PICCOLO, WAIVED
ALT (SGPT) PICCOLO, WAIVED: 16 U/L (ref 10–47)
AST (SGOT) Piccolo, Waived: 35 U/L (ref 11–38)
CHOLESTEROL PICCOLO, WAIVED: 123 mg/dL (ref <200)
Chol/HDL Ratio Piccolo,Waive: 4.4 mg/dL
HDL CHOL PICCOLO, WAIVED: 28 mg/dL — AB (ref >59)
LDL CHOL CALC PICCOLO WAIVED: 51 mg/dL (ref <100)
Triglycerides Piccolo,Waived: 222 mg/dL — ABNORMAL HIGH (ref <150)
VLDL Chol Calc Piccolo,Waive: 44 mg/dL — ABNORMAL HIGH (ref <30)

## 2015-10-28 LAB — BAYER DCA HB A1C WAIVED: HB A1C (BAYER DCA - WAIVED): 6.3 % (ref <7.0)

## 2015-10-28 MED ORDER — HYDROCORTISONE 2.5 % RE CREA: 1.0000 "application " | TOPICAL_CREAM | Freq: Two times a day (BID) | RECTAL | 0 refills | Status: DC

## 2015-10-28 MED ORDER — MUPIROCIN CALCIUM 2 % EX CREA: 1.0000 "application " | TOPICAL_CREAM | Freq: Two times a day (BID) | CUTANEOUS | 0 refills | Status: DC

## 2015-10-28 NOTE — Telephone Encounter (Signed)
Yes prob not covered by ins  but cost will prob not be more than $100 or so

## 2015-10-28 NOTE — Progress Notes (Signed)
BP 127/73   Pulse 67   Temp 98 F (36.7 C)   Wt 152 lb (68.9 kg)   SpO2 99%   BMI 25.69 kg/m    Subjective:    Patient ID: Deanna Bird, female    DOB: 1928/10/03, 82 y.o.   MRN: [id number]  HPI: Deanna Bird is a 82 y.o. female  Chief Complaint  Patient presents with  . Hyperlipidemia  . Diabetes  . Hypertension  . Lab    She'd like to know if you'll order a VIt D and B12 level on her.  . Medication Refill    she needs a refill on the [facility]   Patient follow-up hemoptysis good report from hematology but is a carrier of MRSA. Patient has gone through our of antibiotics and no further coughing but is starting to get crusting in her nose and some mild ulcerations which seemed to be the start of her previous problem. Patient a week ago bought a bag of potato chips and 8 in May she wanted with resulting ankle edema. Stop the potato chips a week ago but still has ankle edema no PND orthopnea other CHF type symptoms Patient otherwise doing well with diabetes and cholesterol no problems with medications  Relevant past medical, surgical, family and social history reviewed and updated as indicated. Interim medical history since our last visit reviewed. Allergies and medications reviewed and updated.  Review of Systems  Constitutional: Negative.   Respiratory: Negative.   Cardiovascular: Negative.     Per HPI unless specifically indicated above     Objective:    BP 127/73   Pulse 67   Temp 98 F (36.7 C)   Wt 152 lb (68.9 kg)   SpO2 99%   BMI 25.69 kg/m   Wt Readings from Last 3 Encounters:  10/28/15 152 lb (68.9 kg)  07/31/15 152 lb (68.9 kg)  06/06/15 152 lb (68.9 kg)    Physical Exam  Constitutional: She is oriented to person, place, and time. She appears well-developed and well-nourished. No distress.  HENT:  Head: Normocephalic and atraumatic.  Right Ear: Hearing normal.  Left Ear: Hearing normal.  Nose: Nose normal.  Eyes: Conjunctivae and lids are  normal. Right eye exhibits no discharge. Left eye exhibits no discharge. No scleral icterus.  Cardiovascular: Normal rate, regular rhythm and normal heart sounds.   Pulmonary/Chest: Effort normal and breath sounds normal. No respiratory distress.  Musculoskeletal: Normal range of motion.  Neurological: She is alert and oriented to person, place, and time.  Skin: Skin is intact. No rash noted.  Psychiatric: She has a normal mood and affect. Her speech is normal and behavior is normal. Judgment and thought content normal. Cognition and memory are normal.    Results for orders placed or performed in visit on 07/31/15  Bayer DCA Hb A1c Waived  Result Value Ref Range   Bayer DCA Hb A1c Waived 6.0 <7.0 %      Assessment & Plan:   Problem List Items Addressed This Visit      Cardiovascular and Mediastinum   Hypertension   Relevant Orders   Basic metabolic panel     Endocrine   Diabetes mellitus without complication ([place]) - Primary   Relevant Orders   Bayer DCA Hb A1c Waived     Other   Hyperlipidemia   Relevant Orders   LP+ALT+AST Piccolo, Waived    Other Visit Diagnoses   None.      Follow up plan:  Return in about 3 months (around 01/28/2016) for Hemoglobin A1c.

## 2015-10-28 NOTE — Telephone Encounter (Signed)
Patient wants to know if a Vit D and B12 level could be added on to the labs she had this morning.

## 2015-10-29 ENCOUNTER — Encounter: Payer: Self-pay | Admitting: Family Medicine

## 2015-10-29 LAB — BASIC METABOLIC PANEL
BUN / CREAT RATIO: 18 (ref 12–28)
BUN: 23 mg/dL (ref 8–27)
CHLORIDE: 103 mmol/L (ref 96–106)
CO2: 27 mmol/L (ref 18–29)
Calcium: 9.2 mg/dL (ref 8.7–10.3)
Creatinine, Ser: 1.27 mg/dL — ABNORMAL HIGH (ref 0.57–1.00)
GFR calc Af Amer: 44 mL/min/{1.73_m2} — ABNORMAL LOW (ref >59)
GFR calc non Af Amer: 39 mL/min/{1.73_m2} — ABNORMAL LOW (ref >59)
GLUCOSE: 51 mg/dL — AB (ref 65–99)
Potassium: 4 mmol/L (ref 3.5–5.2)
Sodium: 145 mmol/L — ABNORMAL HIGH (ref 134–144)

## 2015-10-29 NOTE — Telephone Encounter (Signed)
Patient understood that it probably wouldn't be covered and that she'd pay for it. Lab notified to add on.

## 2015-10-31 ENCOUNTER — Ambulatory Visit: Payer: PPO | Admitting: Family Medicine

## 2015-10-31 LAB — VITAMIN B12: Vitamin B-12: 1280 pg/mL — ABNORMAL HIGH (ref 211–946)

## 2015-10-31 LAB — VITAMIN D 25 HYDROXY (VIT D DEFICIENCY, FRACTURES): VIT D 25 HYDROXY: 47.3 ng/mL (ref 30.0–100.0)

## 2015-10-31 LAB — SPECIMEN STATUS REPORT

## 2015-11-01 ENCOUNTER — Telehealth: Payer: Self-pay | Admitting: Family Medicine

## 2015-11-01 NOTE — Telephone Encounter (Signed)
Lease let her know that her labs came back normal except her vitamin B12 was high. If she is taking a vitamin B12 supplement, she can stop it and just continue with her MVI. Thanks! Everything else came back normal.

## 2015-11-01 NOTE — Telephone Encounter (Signed)
Called patient, no answer, left message for patient to return my call.  

## 2015-12-09 DIAGNOSIS — M25561 Pain in right knee: Secondary | ICD-10-CM | POA: Diagnosis not present

## 2015-12-09 DIAGNOSIS — M25661 Stiffness of right knee, not elsewhere classified: Secondary | ICD-10-CM | POA: Diagnosis not present

## 2015-12-11 DIAGNOSIS — R609 Edema, unspecified: Secondary | ICD-10-CM | POA: Diagnosis not present

## 2015-12-11 DIAGNOSIS — M25661 Stiffness of right knee, not elsewhere classified: Secondary | ICD-10-CM | POA: Diagnosis not present

## 2015-12-11 DIAGNOSIS — M25561 Pain in right knee: Secondary | ICD-10-CM | POA: Diagnosis not present

## 2015-12-13 DIAGNOSIS — M1711 Unilateral primary osteoarthritis, right knee: Secondary | ICD-10-CM | POA: Diagnosis not present

## 2015-12-16 DIAGNOSIS — M25661 Stiffness of right knee, not elsewhere classified: Secondary | ICD-10-CM | POA: Diagnosis not present

## 2015-12-16 DIAGNOSIS — R609 Edema, unspecified: Secondary | ICD-10-CM | POA: Diagnosis not present

## 2015-12-16 DIAGNOSIS — M25561 Pain in right knee: Secondary | ICD-10-CM | POA: Diagnosis not present

## 2015-12-18 DIAGNOSIS — M1711 Unilateral primary osteoarthritis, right knee: Secondary | ICD-10-CM | POA: Diagnosis not present

## 2015-12-19 DIAGNOSIS — M25661 Stiffness of right knee, not elsewhere classified: Secondary | ICD-10-CM | POA: Diagnosis not present

## 2015-12-19 DIAGNOSIS — M25561 Pain in right knee: Secondary | ICD-10-CM | POA: Diagnosis not present

## 2015-12-19 DIAGNOSIS — R609 Edema, unspecified: Secondary | ICD-10-CM | POA: Diagnosis not present

## 2015-12-23 DIAGNOSIS — M25561 Pain in right knee: Secondary | ICD-10-CM | POA: Diagnosis not present

## 2015-12-23 DIAGNOSIS — M25661 Stiffness of right knee, not elsewhere classified: Secondary | ICD-10-CM | POA: Diagnosis not present

## 2015-12-24 ENCOUNTER — Telehealth: Payer: Self-pay

## 2015-12-24 DIAGNOSIS — E119 Type 2 diabetes mellitus without complications: Secondary | ICD-10-CM

## 2015-12-24 MED ORDER — GLIMEPIRIDE 1 MG PO TABS: 2.0000 mg | ORAL_TABLET | Freq: Every day | ORAL | 12 refills | Status: DC

## 2015-12-24 NOTE — Telephone Encounter (Signed)
Rx request for Glimepiride 1 mg tablet, take two tablets daily with breakfast

## 2015-12-25 DIAGNOSIS — M25562 Pain in left knee: Secondary | ICD-10-CM | POA: Diagnosis not present

## 2015-12-26 DIAGNOSIS — M25561 Pain in right knee: Secondary | ICD-10-CM | POA: Diagnosis not present

## 2015-12-26 DIAGNOSIS — M25661 Stiffness of right knee, not elsewhere classified: Secondary | ICD-10-CM | POA: Diagnosis not present

## 2015-12-26 DIAGNOSIS — R609 Edema, unspecified: Secondary | ICD-10-CM | POA: Diagnosis not present

## 2015-12-31 DIAGNOSIS — R609 Edema, unspecified: Secondary | ICD-10-CM | POA: Diagnosis not present

## 2015-12-31 DIAGNOSIS — M25661 Stiffness of right knee, not elsewhere classified: Secondary | ICD-10-CM | POA: Diagnosis not present

## 2015-12-31 DIAGNOSIS — M25561 Pain in right knee: Secondary | ICD-10-CM | POA: Diagnosis not present

## 2016-01-02 DIAGNOSIS — E119 Type 2 diabetes mellitus without complications: Secondary | ICD-10-CM | POA: Diagnosis not present

## 2016-01-02 DIAGNOSIS — R609 Edema, unspecified: Secondary | ICD-10-CM | POA: Diagnosis not present

## 2016-01-02 DIAGNOSIS — M25661 Stiffness of right knee, not elsewhere classified: Secondary | ICD-10-CM | POA: Diagnosis not present

## 2016-01-02 DIAGNOSIS — M25561 Pain in right knee: Secondary | ICD-10-CM | POA: Diagnosis not present

## 2016-01-08 DIAGNOSIS — M25661 Stiffness of right knee, not elsewhere classified: Secondary | ICD-10-CM | POA: Diagnosis not present

## 2016-01-08 DIAGNOSIS — M25561 Pain in right knee: Secondary | ICD-10-CM | POA: Diagnosis not present

## 2016-01-13 DIAGNOSIS — M25561 Pain in right knee: Secondary | ICD-10-CM | POA: Diagnosis not present

## 2016-01-13 DIAGNOSIS — M25661 Stiffness of right knee, not elsewhere classified: Secondary | ICD-10-CM | POA: Diagnosis not present

## 2016-01-13 DIAGNOSIS — R609 Edema, unspecified: Secondary | ICD-10-CM | POA: Diagnosis not present

## 2016-01-15 DIAGNOSIS — K219 Gastro-esophageal reflux disease without esophagitis: Secondary | ICD-10-CM | POA: Diagnosis not present

## 2016-01-15 DIAGNOSIS — R05 Cough: Secondary | ICD-10-CM | POA: Diagnosis not present

## 2016-01-15 DIAGNOSIS — J310 Chronic rhinitis: Secondary | ICD-10-CM | POA: Diagnosis not present

## 2016-01-15 DIAGNOSIS — R918 Other nonspecific abnormal finding of lung field: Secondary | ICD-10-CM | POA: Diagnosis not present

## 2016-01-15 DIAGNOSIS — Z01818 Encounter for other preprocedural examination: Secondary | ICD-10-CM | POA: Diagnosis not present

## 2016-01-28 ENCOUNTER — Ambulatory Visit: Payer: PPO | Admitting: Family Medicine

## 2016-02-05 ENCOUNTER — Encounter: Payer: Self-pay | Admitting: Family Medicine

## 2016-02-05 ENCOUNTER — Ambulatory Visit (INDEPENDENT_AMBULATORY_CARE_PROVIDER_SITE_OTHER): Payer: PPO | Admitting: Family Medicine

## 2016-02-05 VITALS — BP 128/68 | HR 82 | Temp 97.8°F | Ht 63.0 in | Wt 155.0 lb

## 2016-02-05 DIAGNOSIS — E782 Mixed hyperlipidemia: Secondary | ICD-10-CM

## 2016-02-05 DIAGNOSIS — E119 Type 2 diabetes mellitus without complications: Secondary | ICD-10-CM

## 2016-02-05 DIAGNOSIS — I10 Essential (primary) hypertension: Secondary | ICD-10-CM

## 2016-02-05 LAB — BAYER DCA HB A1C WAIVED: HB A1C (BAYER DCA - WAIVED): 5.8 % (ref <7.0)

## 2016-02-05 MED ORDER — METOPROLOL SUCCINATE ER 100 MG PO TB24: 100.0000 mg | ORAL_TABLET | Freq: Every day | ORAL | 6 refills | Status: DC

## 2016-02-05 NOTE — Assessment & Plan Note (Signed)
The current medical regimen is effective;  continue present plan and medications.  

## 2016-02-05 NOTE — Progress Notes (Signed)
BP 128/68   Pulse 82   Temp 97.8 F (36.6 C) (Oral)   Ht 5\' 3"  (1.6 m)   Wt 155 lb (70.3 kg)   SpO2 99%   BMI 27.46 kg/m    Subjective:    Patient ID: Deanna Bird, female    DOB: [birth year], 83 y.o.   MRN: [id number]  HPI: Deanna Bird is a 83 y.o. female  Chief Complaint  Patient presents with  . Follow-up  . Diabetes  . Knee Pain    Right. Chronic.    Patient doing well diabetes no complaints has had some peak on pies over the holiday season but A1c's doing really well Knee doing okay at best patient may be having surgery sometime late winter or early spring. Cholesterol doing well in no issues with blood pressure medicines. Relevant past medical, surgical, family and social history reviewed and updated as indicated. Interim medical history since our last visit reviewed. Allergies and medications reviewed and updated.  Review of Systems  Constitutional: Negative.   Respiratory: Negative.   Cardiovascular: Negative.     Per HPI unless specifically indicated above     Objective:    BP 128/68   Pulse 82   Temp 97.8 F (36.6 C) (Oral)   Ht 5\' 3"  (1.6 m)   Wt 155 lb (70.3 kg)   SpO2 99%   BMI 27.46 kg/m   Wt Readings from Last 3 Encounters:  02/05/16 155 lb (70.3 kg)  10/28/15 152 lb (68.9 kg)  07/31/15 152 lb (68.9 kg)    Physical Exam  Constitutional: She is oriented to person, place, and time. She appears well-developed and well-nourished. No distress.  HENT:  Head: Normocephalic and atraumatic.  Right Ear: Hearing normal.  Left Ear: Hearing normal.  Nose: Nose normal.  Eyes: Conjunctivae and lids are normal. Right eye exhibits no discharge. Left eye exhibits no discharge. No scleral icterus.  Cardiovascular: Normal rate, regular rhythm and normal heart sounds.   Pulmonary/Chest: Effort normal and breath sounds normal. No respiratory distress.  Musculoskeletal: Normal range of motion.  Neurological: She is alert and oriented to person, place,  and time.  Skin: Skin is intact. No rash noted.  Psychiatric: She has a normal mood and affect. Her speech is normal and behavior is normal. Judgment and thought content normal. Cognition and memory are normal.    Results for orders placed or performed in visit on 10/28/15  LP+ALT+AST Piccolo, [employer]  Result Value Ref Range   ALT (SGPT) Piccolo, Waived 16 10 - 47 U/L   AST (SGOT) Piccolo, Waived 35 11 - 38 U/L   Cholesterol Piccolo, Waived 123 <200 mg/dL   HDL Chol Piccolo, Waived 28 (L) >59 mg/dL   Triglycerides Piccolo,Waived 222 (H) <150 mg/dL   Chol/HDL Ratio Piccolo,Waive 4.4 mg/dL   LDL Chol Calc Piccolo Waived 51 <100 mg/dL   VLDL Chol Calc Piccolo,Waive 44 (H) <30 mg/dL  Bayer DCA Hb A1c Waived  Result Value Ref Range   Bayer DCA Hb A1c Waived 6.3 Q000111Q %  Basic metabolic panel  Result Value Ref Range   Glucose 51 (L) 65 - 99 mg/dL   BUN 23 8 - 27 mg/dL   Creatinine, Ser 1.27 (H) 0.57 - 1.00 mg/dL   GFR calc non Af Amer 39 (L) >59 mL/min/1.73   GFR calc Af Amer 44 (L) >59 mL/min/1.73   BUN/Creatinine Ratio 18 12 - 28   Sodium 145 (H) 134 - 144 mmol/L  Potassium 4.0 3.5 - 5.2 mmol/L   Chloride 103 96 - 106 mmol/L   CO2 27 18 - 29 mmol/L   Calcium 9.2 8.7 - 10.3 mg/dL  VITAMIN D 25 Hydroxy (Vit-D Deficiency, Fractures)  Result Value Ref Range   Vit D, 25-Hydroxy 47.3 30.0 - 100.0 ng/mL  Vitamin B12  Result Value Ref Range   Vitamin B-12 1,280 (H) 211 - 946 pg/mL  Specimen status report  Result Value Ref Range   specimen status report Comment       Assessment & Plan:   Problem List Items Addressed This Visit      Cardiovascular and Mediastinum   Hypertension    The current medical regimen is effective;  continue present plan and medications.       Relevant Medications   metoprolol succinate (TOPROL-XL) 100 MG 24 hr tablet   Other Relevant Orders   Bayer DCA Hb A1c Waived (STAT)     Endocrine   Diabetes mellitus without complication ([place]) - Primary     The current medical regimen is effective;  continue present plan and medications.       Relevant Orders   Bayer DCA Hb A1c Waived (STAT)     Other   Hyperlipidemia    The current medical regimen is effective;  continue present plan and medications.       Relevant Medications   metoprolol succinate (TOPROL-XL) 100 MG 24 hr tablet   Other Relevant Orders   Bayer DCA Hb A1c Waived (STAT)       Follow up plan: Return in about 3 months (around 05/04/2016) for Physical Exam, Hemoglobin A1c.

## 2016-02-07 DIAGNOSIS — M1711 Unilateral primary osteoarthritis, right knee: Secondary | ICD-10-CM | POA: Diagnosis not present

## 2016-02-07 DIAGNOSIS — M25561 Pain in right knee: Secondary | ICD-10-CM | POA: Diagnosis not present

## 2016-03-17 DIAGNOSIS — M1711 Unilateral primary osteoarthritis, right knee: Secondary | ICD-10-CM | POA: Diagnosis not present

## 2016-03-19 DIAGNOSIS — M1711 Unilateral primary osteoarthritis, right knee: Secondary | ICD-10-CM | POA: Insufficient documentation

## 2016-03-20 ENCOUNTER — Inpatient Hospital Stay: Admission: RE | Admit: 2016-03-20 | Payer: PPO | Source: Ambulatory Visit

## 2016-03-20 ENCOUNTER — Encounter
Admission: RE | Admit: 2016-03-20 | Discharge: 2016-03-20 | Disposition: A | Discharge status: Home / Self Care | Payer: PPO | Source: Ambulatory Visit | Attending: Orthopedic Surgery | Admitting: Orthopedic Surgery

## 2016-03-20 DIAGNOSIS — Z01818 Encounter for other preprocedural examination: Secondary | ICD-10-CM | POA: Insufficient documentation

## 2016-03-20 HISTORY — DX: Methicillin resistant Staphylococcus aureus infection, unspecified site: A49.02

## 2016-03-20 LAB — URINALYSIS, ROUTINE W REFLEX MICROSCOPIC
BILIRUBIN URINE: NEGATIVE
GLUCOSE, UA: NEGATIVE mg/dL
HGB URINE DIPSTICK: NEGATIVE
Ketones, ur: NEGATIVE mg/dL
Leukocytes, UA: NEGATIVE
Nitrite: NEGATIVE
PROTEIN: NEGATIVE mg/dL
Specific Gravity, Urine: 1.014 (ref 1.005–1.030)
pH: 7 (ref 5.0–8.0)

## 2016-03-20 LAB — CBC
HCT: 39.6 % (ref 35.0–47.0)
Hemoglobin: 13.2 g/dL (ref 12.0–16.0)
MCH: 29.9 pg (ref 26.0–34.0)
MCHC: 33.4 g/dL (ref 32.0–36.0)
MCV: 89.5 fL (ref 80.0–100.0)
PLATELETS: 234 10*3/uL (ref 150–440)
RBC: 4.42 MIL/uL (ref 3.80–5.20)
RDW: 14.1 % (ref 11.5–14.5)
WBC: 9 10*3/uL (ref 3.6–11.0)

## 2016-03-20 LAB — TYPE AND SCREEN
ABO/RH(D): O POS
ANTIBODY SCREEN: NEGATIVE

## 2016-03-20 LAB — COMPREHENSIVE METABOLIC PANEL
ALBUMIN: 3.9 g/dL (ref 3.5–5.0)
ALT: 21 U/L (ref 14–54)
ANION GAP: 6 (ref 5–15)
AST: 33 U/L (ref 15–41)
Alkaline Phosphatase: 33 U/L — ABNORMAL LOW (ref 38–126)
BUN: 30 mg/dL — ABNORMAL HIGH (ref 6–20)
CALCIUM: 9.6 mg/dL (ref 8.9–10.3)
CHLORIDE: 102 mmol/L (ref 101–111)
CO2: 32 mmol/L (ref 22–32)
Creatinine, Ser: 1.13 mg/dL — ABNORMAL HIGH (ref 0.44–1.00)
GFR calc non Af Amer: 43 mL/min — ABNORMAL LOW (ref >60)
GFR, EST AFRICAN AMERICAN: 50 mL/min — AB (ref >60)
Glucose, Bld: 51 mg/dL — ABNORMAL LOW (ref 65–99)
POTASSIUM: 3.6 mmol/L (ref 3.5–5.1)
SODIUM: 140 mmol/L (ref 135–145)
Total Bilirubin: 0.8 mg/dL (ref 0.3–1.2)
Total Protein: 7.1 g/dL (ref 6.5–8.1)

## 2016-03-20 LAB — SURGICAL PCR SCREEN
MRSA, PCR: NEGATIVE
Staphylococcus aureus: POSITIVE — AB

## 2016-03-20 LAB — PROTIME-INR
INR: 1.03
Prothrombin Time: 13.5 seconds (ref 11.4–15.2)

## 2016-03-20 LAB — APTT: APTT: 28 s (ref 24–36)

## 2016-03-20 LAB — SEDIMENTATION RATE: Sed Rate: 8 mm/hr (ref 0–30)

## 2016-03-20 LAB — C-REACTIVE PROTEIN: CRP: <0.8 mg/dL (ref <1.0)

## 2016-03-20 NOTE — Patient Instructions (Signed)
Your procedure is scheduled on: 04/01/16 Wed Report to Same Day Surgery 2nd floor medical mall [facility] Entrance-take elevator on left to 2nd floor.  Check in with surgery information desk.) To find out your arrival time please call [phone] between 1PM - 3PM on 03/31/16 Tues  Remember: Instructions that are not followed completely may result in serious medical risk, up to and including death, or upon the discretion of your surgeon and anesthesiologist your surgery may need to be rescheduled.    _x___ 1. Do not eat food or drink liquids after midnight. No gum chewing or                              hard candies.     __x__ 2. No Alcohol for 24 hours before or after surgery.   __x__3. No Smoking for 24 prior to surgery.   ____  4. Bring all medications with you on the day of surgery if instructed.    __x__ 5. Notify your doctor if there is any change in your medical condition     (cold, fever, infections).     Do not wear jewelry, make-up, hairpins, clips or nail polish.  Do not wear lotions, powders, or perfumes. You may wear deodorant.  Do not shave 48 hours prior to surgery. Men may shave face and neck.  Do not bring valuables to the hospital.    [facility] is not responsible for any belongings or valuables.               Contacts, dentures or bridgework may not be worn into surgery.  Leave your suitcase in the car. After surgery it may be brought to your room.  For patients admitted to the hospital, discharge time is determined by your                       treatment team.   Patients discharged the day of surgery will not be allowed to drive home.  You will need someone to drive you home and stay with you the night of your procedure.    Please read over the following fact sheets that you were given:   [facility] Preparing for Surgery and or MRSA Information   _x___ Take anti-hypertensive (unless it includes a diuretic), cardiac, seizure, asthma,     anti-reflux and  psychiatric medicines. These include:  1. Fenofibric Acid   2.metoprolol succinate   3.  4.  5.  6.  ____Fleets enema or Magnesium Citrate as directed.   _x___ Use CHG Soap or sage wipes as directed on instruction sheet   ____ Use inhalers on the day of surgery and bring to hospital day of surgery  ____ Stop Metformin and Janumet 2 days prior to surgery.    ____ Take 1/2 of usual insulin dose the night before surgery and none on the morning     surgery.   _x___ Follow recommendations from Cardiologist, Pulmonologist or PCP regarding          stopping Aspirin, Coumadin, Pllavix ,Eliquis, Effient, or Pradaxa, and Pletal.  X____Stop Anti-inflammatories such as Advil, Aleve, Ibuprofen, Motrin, Naproxen, Naprosyn, Goodies powders or aspirin products. OK to take Tylenol and                          Celebrex.   _x___ Stop supplements until after surgery.  But may continue  Vitamin D, Vitamin B,       and multivitamin.   ____ Bring C-Pap to the hospital.

## 2016-03-21 LAB — HEMOGLOBIN A1C
HEMOGLOBIN A1C: 5.5 % (ref 4.8–5.6)
MEAN PLASMA GLUCOSE: 111 mg/dL

## 2016-03-22 LAB — URINE CULTURE
Culture: <10000 — AB
Special Requests: NORMAL

## 2016-03-23 NOTE — Pre-Procedure Instructions (Signed)
CLEARED 01/15/16 BY DR Raul Del ON CHART

## 2016-03-26 DIAGNOSIS — M1711 Unilateral primary osteoarthritis, right knee: Secondary | ICD-10-CM | POA: Diagnosis not present

## 2016-03-30 ENCOUNTER — Telehealth: Payer: Self-pay | Admitting: Family Medicine

## 2016-03-30 NOTE — Telephone Encounter (Signed)
Noted. Will forward to Provider as Juluis Rainier.

## 2016-03-30 NOTE — Telephone Encounter (Signed)
Pt called to let PCP know she was scheduled to have a total knee replacement 04/01/16 with Dr Marry Guan.

## 2016-04-01 ENCOUNTER — Inpatient Hospital Stay
Admission: RE | Admit: 2016-04-01 | Discharge: 2016-04-03 | DRG: 470 | Disposition: A | Discharge status: Home Health | Payer: PPO | Source: Ambulatory Visit | Attending: Orthopedic Surgery | Admitting: Orthopedic Surgery

## 2016-04-01 ENCOUNTER — Inpatient Hospital Stay: Payer: PPO | Admitting: Anesthesiology

## 2016-04-01 ENCOUNTER — Encounter: Payer: Self-pay | Admitting: Orthopedic Surgery

## 2016-04-01 ENCOUNTER — Encounter
Admission: RE | Disposition: A | Discharge status: Home / Self Care | Payer: Self-pay | Source: Ambulatory Visit | Attending: Orthopedic Surgery

## 2016-04-01 ENCOUNTER — Inpatient Hospital Stay: Payer: PPO

## 2016-04-01 DIAGNOSIS — Z8614 Personal history of Methicillin resistant Staphylococcus aureus infection: Secondary | ICD-10-CM

## 2016-04-01 DIAGNOSIS — E785 Hyperlipidemia, unspecified: Secondary | ICD-10-CM | POA: Diagnosis not present

## 2016-04-01 DIAGNOSIS — Z79899 Other long term (current) drug therapy: Secondary | ICD-10-CM | POA: Diagnosis not present

## 2016-04-01 DIAGNOSIS — E119 Type 2 diabetes mellitus without complications: Secondary | ICD-10-CM | POA: Diagnosis not present

## 2016-04-01 DIAGNOSIS — K220 Achalasia of cardia: Secondary | ICD-10-CM | POA: Diagnosis present

## 2016-04-01 DIAGNOSIS — K22719 Barrett's esophagus with dysplasia, unspecified: Secondary | ICD-10-CM | POA: Diagnosis present

## 2016-04-01 DIAGNOSIS — Z96652 Presence of left artificial knee joint: Secondary | ICD-10-CM

## 2016-04-01 DIAGNOSIS — Z885 Allergy status to narcotic agent status: Secondary | ICD-10-CM | POA: Diagnosis not present

## 2016-04-01 DIAGNOSIS — K219 Gastro-esophageal reflux disease without esophagitis: Secondary | ICD-10-CM | POA: Diagnosis not present

## 2016-04-01 DIAGNOSIS — M1711 Unilateral primary osteoarthritis, right knee: Secondary | ICD-10-CM | POA: Diagnosis not present

## 2016-04-01 DIAGNOSIS — D62 Acute posthemorrhagic anemia: Secondary | ICD-10-CM | POA: Diagnosis not present

## 2016-04-01 DIAGNOSIS — I10 Essential (primary) hypertension: Secondary | ICD-10-CM | POA: Diagnosis not present

## 2016-04-01 DIAGNOSIS — Z7984 Long term (current) use of oral hypoglycemic drugs: Secondary | ICD-10-CM | POA: Diagnosis not present

## 2016-04-01 DIAGNOSIS — Z96659 Presence of unspecified artificial knee joint: Secondary | ICD-10-CM

## 2016-04-01 HISTORY — PX: KNEE ARTHROPLASTY: SHX992

## 2016-04-01 LAB — ABO/RH: ABO/RH(D): O POS

## 2016-04-01 LAB — GLUCOSE, CAPILLARY
GLUCOSE-CAPILLARY: 60 mg/dL — AB (ref 65–99)
GLUCOSE-CAPILLARY: 59 mg/dL — AB (ref 65–99)
Glucose-Capillary: 74 mg/dL (ref 65–99)

## 2016-04-01 SURGERY — ARTHROPLASTY, KNEE, TOTAL, USING IMAGELESS COMPUTER-ASSISTED NAVIGATION
Anesthesia: Spinal | Laterality: Right | Wound class: Clean

## 2016-04-01 MED ORDER — SODIUM CHLORIDE 0.9 % IV SOLN
INTRAVENOUS | Status: DC | PRN
  Administered 2016-04-01: 60 mL

## 2016-04-01 MED ORDER — TETRACAINE HCL 1 % IJ SOLN
INTRAMUSCULAR | Status: DC | PRN
  Administered 2016-04-01: 1 mg via INTRASPINAL

## 2016-04-01 MED ORDER — FAMOTIDINE 20 MG PO TABS
ORAL_TABLET | ORAL | Status: AC
  Administered 2016-04-01: 20 mg
  Filled 2016-04-01: qty 1

## 2016-04-01 MED ORDER — VITAMIN D 1000 UNITS PO TABS
5000.0000 [IU] | ORAL_TABLET | Freq: Every day | ORAL | Status: DC
  Administered 2016-04-01: 5000 [IU] via ORAL
  Filled 2016-04-01: qty 5

## 2016-04-01 MED ORDER — ACETAMINOPHEN 325 MG PO TABS: 650.0000 mg | ORAL_TABLET | Freq: Four times a day (QID) | ORAL | Status: DC | PRN

## 2016-04-01 MED ORDER — LOSARTAN POTASSIUM 50 MG PO TABS
100.0000 mg | ORAL_TABLET | Freq: Every day | ORAL | Status: DC
  Administered 2016-04-03: 100 mg via ORAL
  Filled 2016-04-01 (×2): qty 2

## 2016-04-01 MED ORDER — INSULIN ASPART 100 UNIT/ML ~~LOC~~ SOLN
0.0000 [IU] | Freq: Three times a day (TID) | SUBCUTANEOUS | Status: DC
  Administered 2016-04-02 (×2): 2 [IU] via SUBCUTANEOUS
  Filled 2016-04-01: qty 2

## 2016-04-01 MED ORDER — FAMOTIDINE 20 MG PO TABS: 20.0000 mg | ORAL_TABLET | Freq: Once | ORAL | Status: DC

## 2016-04-01 MED ORDER — SODIUM CHLORIDE 0.9 % IV SOLN
INTRAVENOUS | Status: DC | PRN
  Administered 2016-04-01: 1000 mg via INTRAVENOUS

## 2016-04-01 MED ORDER — CEFAZOLIN SODIUM-DEXTROSE 2-4 GM/100ML-% IV SOLN: 2.0000 g | INTRAVENOUS | Status: DC

## 2016-04-01 MED ORDER — FERROUS SULFATE 325 (65 FE) MG PO TABS
325.0000 mg | ORAL_TABLET | Freq: Two times a day (BID) | ORAL | Status: DC
  Administered 2016-04-01 – 2016-04-03 (×3): 325 mg via ORAL
  Filled 2016-04-01 (×3): qty 1

## 2016-04-01 MED ORDER — SENNOSIDES-DOCUSATE SODIUM 8.6-50 MG PO TABS
1.0000 | ORAL_TABLET | Freq: Two times a day (BID) | ORAL | Status: DC
  Administered 2016-04-01 – 2016-04-03 (×4): 1 via ORAL
  Filled 2016-04-01 (×4): qty 1

## 2016-04-01 MED ORDER — HYDROCHLOROTHIAZIDE 25 MG PO TABS
25.0000 mg | ORAL_TABLET | Freq: Every day | ORAL | Status: DC
  Administered 2016-04-03: 25 mg via ORAL
  Filled 2016-04-01 (×2): qty 1

## 2016-04-01 MED ORDER — MORPHINE SULFATE (PF) 2 MG/ML IV SOLN: 2.0000 mg | INTRAVENOUS | Status: DC | PRN

## 2016-04-01 MED ORDER — BUPIVACAINE LIPOSOME 1.3 % IJ SUSP
INTRAMUSCULAR | Status: AC
  Filled 2016-04-01: qty 20

## 2016-04-01 MED ORDER — BUPIVACAINE HCL (PF) 0.25 % IJ SOLN
INTRAMUSCULAR | Status: DC | PRN
  Administered 2016-04-01: 60 mL via INTRA_ARTICULAR

## 2016-04-01 MED ORDER — SODIUM CHLORIDE 0.9 % IJ SOLN
INTRAMUSCULAR | Status: AC
  Filled 2016-04-01: qty 50

## 2016-04-01 MED ORDER — BUPIVACAINE HCL (PF) 0.25 % IJ SOLN
INTRAMUSCULAR | Status: AC
  Filled 2016-04-01: qty 60

## 2016-04-01 MED ORDER — NEOMYCIN-POLYMYXIN B GU 40-200000 IR SOLN
Status: AC
  Filled 2016-04-01: qty 20

## 2016-04-01 MED ORDER — SODIUM CHLORIDE 0.9 % IV SOLN
INTRAVENOUS | Status: DC
  Administered 2016-04-01: 11:00:00 via INTRAVENOUS

## 2016-04-01 MED ORDER — TRANEXAMIC ACID 1000 MG/10ML IV SOLN
1000.0000 mg | Freq: Once | INTRAVENOUS | Status: AC
  Administered 2016-04-01: 1000 mg via INTRAVENOUS
  Filled 2016-04-01: qty 10

## 2016-04-01 MED ORDER — CEFAZOLIN SODIUM-DEXTROSE 2-4 GM/100ML-% IV SOLN
INTRAVENOUS | Status: AC
  Filled 2016-04-01: qty 100

## 2016-04-01 MED ORDER — FENTANYL CITRATE (PF) 100 MCG/2ML IJ SOLN
INTRAMUSCULAR | Status: AC
  Administered 2016-04-01: 25 ug via INTRAVENOUS
  Filled 2016-04-01: qty 2

## 2016-04-01 MED ORDER — BISACODYL 10 MG RE SUPP: 10.0000 mg | Freq: Every day | RECTAL | Status: DC | PRN

## 2016-04-01 MED ORDER — METOPROLOL SUCCINATE ER 50 MG PO TB24
100.0000 mg | ORAL_TABLET | Freq: Every day | ORAL | Status: DC
  Administered 2016-04-03: 100 mg via ORAL
  Filled 2016-04-01 (×2): qty 2

## 2016-04-01 MED ORDER — GLIMEPIRIDE 2 MG PO TABS
2.0000 mg | ORAL_TABLET | Freq: Every day | ORAL | Status: DC
  Filled 2016-04-01: qty 1

## 2016-04-01 MED ORDER — PROPOFOL 500 MG/50ML IV EMUL
INTRAVENOUS | Status: DC | PRN
  Administered 2016-04-01: 25 ug/kg/min via INTRAVENOUS

## 2016-04-01 MED ORDER — MENTHOL 3 MG MT LOZG
1.0000 | LOZENGE | OROMUCOSAL | Status: DC | PRN
  Filled 2016-04-01: qty 9

## 2016-04-01 MED ORDER — NEOMYCIN-POLYMYXIN B GU 40-200000 IR SOLN
Status: DC | PRN
  Administered 2016-04-01: 16 mL

## 2016-04-01 MED ORDER — BUPIVACAINE HCL (PF) 0.5 % IJ SOLN
INTRAMUSCULAR | Status: DC | PRN
Start: 2016-04-01 — End: 2016-04-01
  Administered 2016-04-01: 2 mL

## 2016-04-01 MED ORDER — SODIUM CHLORIDE 0.9 % IV SOLN
INTRAVENOUS | Status: DC
Start: 2016-04-01 — End: 2016-04-03
  Administered 2016-04-01 – 2016-04-02 (×2): via INTRAVENOUS

## 2016-04-01 MED ORDER — CEFAZOLIN SODIUM-DEXTROSE 2-3 GM-% IV SOLR
2.0000 g | Freq: Four times a day (QID) | INTRAVENOUS | Status: AC
Start: 2016-04-01 — End: 2016-04-02
  Administered 2016-04-01 – 2016-04-02 (×4): 2 g via INTRAVENOUS
  Filled 2016-04-01 (×4): qty 50

## 2016-04-01 MED ORDER — ONDANSETRON HCL 4 MG/2ML IJ SOLN
4.0000 mg | Freq: Four times a day (QID) | INTRAMUSCULAR | Status: DC | PRN
  Administered 2016-04-02: 4 mg via INTRAVENOUS
  Filled 2016-04-01: qty 2

## 2016-04-01 MED ORDER — DIPHENHYDRAMINE HCL 12.5 MG/5ML PO ELIX: 12.5000 mg | ORAL_SOLUTION | ORAL | Status: DC | PRN

## 2016-04-01 MED ORDER — ACETAMINOPHEN 10 MG/ML IV SOLN
1000.0000 mg | Freq: Four times a day (QID) | INTRAVENOUS | Status: AC
  Administered 2016-04-01 – 2016-04-02 (×4): 1000 mg via INTRAVENOUS
  Filled 2016-04-01 (×4): qty 100

## 2016-04-01 MED ORDER — CELECOXIB 200 MG PO CAPS
200.0000 mg | ORAL_CAPSULE | Freq: Two times a day (BID) | ORAL | Status: DC
  Administered 2016-04-01 – 2016-04-03 (×4): 200 mg via ORAL
  Filled 2016-04-01 (×4): qty 1

## 2016-04-01 MED ORDER — ACETAMINOPHEN 650 MG RE SUPP: 650.0000 mg | Freq: Four times a day (QID) | RECTAL | Status: DC | PRN

## 2016-04-01 MED ORDER — FENTANYL CITRATE (PF) 100 MCG/2ML IJ SOLN
INTRAMUSCULAR | Status: DC | PRN
  Administered 2016-04-01: 10 ug via INTRAVENOUS
  Administered 2016-04-01: 50 ug via INTRAVENOUS
  Administered 2016-04-01: 5 ug via INTRAVENOUS
  Administered 2016-04-01: 10 ug via INTRAVENOUS

## 2016-04-01 MED ORDER — FLEET ENEMA 7-19 GM/118ML RE ENEM: 1.0000 | ENEMA | Freq: Once | RECTAL | Status: DC | PRN

## 2016-04-01 MED ORDER — MIDAZOLAM HCL 5 MG/5ML IJ SOLN
INTRAMUSCULAR | Status: DC | PRN
  Administered 2016-04-01 (×4): .25 mg via INTRAVENOUS

## 2016-04-01 MED ORDER — MIDAZOLAM HCL 2 MG/2ML IJ SOLN
INTRAMUSCULAR | Status: AC
  Filled 2016-04-01: qty 2

## 2016-04-01 MED ORDER — TRAMADOL HCL 50 MG PO TABS
50.0000 mg | ORAL_TABLET | ORAL | Status: DC | PRN
  Administered 2016-04-01: 50 mg via ORAL
  Administered 2016-04-02 – 2016-04-03 (×3): 100 mg via ORAL
  Filled 2016-04-01: qty 2
  Filled 2016-04-01: qty 1
  Filled 2016-04-01 (×3): qty 2

## 2016-04-01 MED ORDER — MAGNESIUM HYDROXIDE 400 MG/5ML PO SUSP
30.0000 mL | Freq: Every day | ORAL | Status: DC | PRN
Start: 2016-04-01 — End: 2016-04-03
  Administered 2016-04-02: 30 mL via ORAL

## 2016-04-01 MED ORDER — FENTANYL CITRATE (PF) 100 MCG/2ML IJ SOLN
25.0000 ug | INTRAMUSCULAR | Status: DC | PRN
  Administered 2016-04-01 (×4): 25 ug via INTRAVENOUS

## 2016-04-01 MED ORDER — TRANEXAMIC ACID 1000 MG/10ML IV SOLN
1000.0000 mg | INTRAVENOUS | Status: DC
  Filled 2016-04-01: qty 10

## 2016-04-01 MED ORDER — ADULT MULTIVITAMIN W/MINERALS CH
1.0000 | ORAL_TABLET | Freq: Every day | ORAL | Status: DC
  Administered 2016-04-01 – 2016-04-03 (×3): 1 via ORAL
  Filled 2016-04-01 (×3): qty 1

## 2016-04-01 MED ORDER — BUPIVACAINE HCL (PF) 0.5 % IJ SOLN
INTRAMUSCULAR | Status: AC
  Filled 2016-04-01: qty 10

## 2016-04-01 MED ORDER — OXYCODONE HCL 5 MG/5ML PO SOLN: 5.0000 mg | Freq: Once | ORAL | Status: DC | PRN

## 2016-04-01 MED ORDER — CEFAZOLIN SODIUM-DEXTROSE 2-4 GM/100ML-% IV SOLN: 2.0000 g | Freq: Four times a day (QID) | INTRAVENOUS | Status: DC

## 2016-04-01 MED ORDER — OXYCODONE HCL 5 MG PO TABS: 5.0000 mg | ORAL_TABLET | Freq: Once | ORAL | Status: DC | PRN

## 2016-04-01 MED ORDER — ONDANSETRON HCL 4 MG PO TABS: 4.0000 mg | ORAL_TABLET | Freq: Four times a day (QID) | ORAL | Status: DC | PRN

## 2016-04-01 MED ORDER — CEFAZOLIN SODIUM-DEXTROSE 2-3 GM-% IV SOLR
INTRAVENOUS | Status: DC | PRN
  Administered 2016-04-01: 2 g via INTRAVENOUS

## 2016-04-01 MED ORDER — FENTANYL CITRATE (PF) 100 MCG/2ML IJ SOLN
INTRAMUSCULAR | Status: AC
  Filled 2016-04-01: qty 2

## 2016-04-01 MED ORDER — PHENOL 1.4 % MT LIQD
1.0000 | OROMUCOSAL | Status: DC | PRN
  Filled 2016-04-01: qty 177

## 2016-04-01 MED ORDER — PROPOFOL 500 MG/50ML IV EMUL
INTRAVENOUS | Status: AC
  Filled 2016-04-01: qty 50

## 2016-04-01 MED ORDER — EPHEDRINE SULFATE 50 MG/ML IJ SOLN
INTRAMUSCULAR | Status: DC | PRN
  Administered 2016-04-01: 10 mg via INTRAVENOUS

## 2016-04-01 MED ORDER — ENOXAPARIN SODIUM 30 MG/0.3ML ~~LOC~~ SOLN
30.0000 mg | Freq: Two times a day (BID) | SUBCUTANEOUS | Status: DC
  Administered 2016-04-02: 30 mg via SUBCUTANEOUS
  Filled 2016-04-01: qty 0.3

## 2016-04-01 MED ORDER — CHLORHEXIDINE GLUCONATE 4 % EX LIQD: 60.0000 mL | Freq: Once | CUTANEOUS | Status: DC

## 2016-04-01 MED ORDER — PANTOPRAZOLE SODIUM 40 MG PO TBEC
40.0000 mg | DELAYED_RELEASE_TABLET | Freq: Two times a day (BID) | ORAL | Status: DC
  Administered 2016-04-01 – 2016-04-03 (×4): 40 mg via ORAL
  Filled 2016-04-01 (×4): qty 1

## 2016-04-01 MED ORDER — ACETAMINOPHEN 10 MG/ML IV SOLN
INTRAVENOUS | Status: AC
  Filled 2016-04-01: qty 100

## 2016-04-01 MED ORDER — ALUM & MAG HYDROXIDE-SIMETH 200-200-20 MG/5ML PO SUSP: 30.0000 mL | ORAL | Status: DC | PRN

## 2016-04-01 MED ORDER — LOSARTAN POTASSIUM-HCTZ 100-25 MG PO TABS: 1.0000 | ORAL_TABLET | Freq: Every day | ORAL | Status: DC

## 2016-04-01 MED ORDER — OXYCODONE HCL 5 MG PO TABS
5.0000 mg | ORAL_TABLET | ORAL | Status: DC | PRN
  Administered 2016-04-01 – 2016-04-03 (×3): 5 mg via ORAL
  Filled 2016-04-01 (×3): qty 1

## 2016-04-01 MED ORDER — RISAQUAD PO CAPS
1.0000 | ORAL_CAPSULE | Freq: Every day | ORAL | Status: DC
  Administered 2016-04-02 – 2016-04-03 (×2): 1 via ORAL
  Filled 2016-04-01 (×2): qty 1

## 2016-04-01 MED ORDER — OCUVITE-LUTEIN PO CAPS
1.0000 | ORAL_CAPSULE | Freq: Two times a day (BID) | ORAL | Status: DC
  Administered 2016-04-01 – 2016-04-03 (×4): 1 via ORAL
  Filled 2016-04-01 (×5): qty 1

## 2016-04-01 MED ORDER — ACETAMINOPHEN 10 MG/ML IV SOLN
INTRAVENOUS | Status: DC | PRN
  Administered 2016-04-01: 1000 mg via INTRAVENOUS

## 2016-04-01 MED ORDER — LACTATED RINGERS IV SOLN
INTRAVENOUS | Status: DC | PRN
  Administered 2016-04-01 (×2): via INTRAVENOUS

## 2016-04-01 MED ORDER — FENOFIBRATE 54 MG PO TABS
108.0000 mg | ORAL_TABLET | Freq: Every day | ORAL | Status: DC
  Administered 2016-04-02 – 2016-04-03 (×2): 108 mg via ORAL
  Filled 2016-04-01 (×2): qty 2

## 2016-04-01 MED ORDER — PHENYLEPHRINE HCL 10 MG/ML IJ SOLN
INTRAMUSCULAR | Status: DC | PRN
  Administered 2016-04-01: 50 ug via INTRAVENOUS
  Administered 2016-04-01: 100 ug via INTRAVENOUS
  Administered 2016-04-01 (×2): 50 ug via INTRAVENOUS
  Administered 2016-04-01: 100 ug via INTRAVENOUS
  Administered 2016-04-01 (×4): 50 ug via INTRAVENOUS
  Administered 2016-04-01: 100 ug via INTRAVENOUS

## 2016-04-01 MED ORDER — METOCLOPRAMIDE HCL 10 MG PO TABS
10.0000 mg | ORAL_TABLET | Freq: Three times a day (TID) | ORAL | Status: DC
  Administered 2016-04-01 – 2016-04-03 (×6): 10 mg via ORAL
  Filled 2016-04-01 (×6): qty 1

## 2016-04-01 SURGICAL SUPPLY — 63 items
BATTERY INSTRU NAVIGATION (MISCELLANEOUS) ×12 IMPLANT
BLADE SAW 1 (BLADE) ×3 IMPLANT
BLADE SAW 1/2 (BLADE) ×3 IMPLANT
BLADE SAW 70X12.5 (BLADE) ×3 IMPLANT
BONE CEMENT GENTAMICIN (Cement) ×6 IMPLANT
CANISTER SUCT 1200ML W/VALVE (MISCELLANEOUS) ×3 IMPLANT
CANISTER SUCT 3000ML (MISCELLANEOUS) ×6 IMPLANT
CAPT KNEE TOTAL 3 ATTUNE ×3 IMPLANT
CATH TRAY METER 16FR LF (MISCELLANEOUS) ×3 IMPLANT
CEMENT BONE GENTAMICIN 40 (Cement) ×2 IMPLANT
COOLER POLAR GLACIER W/PUMP (MISCELLANEOUS) ×3 IMPLANT
CUFF TOURN 24 STER (MISCELLANEOUS) IMPLANT
CUFF TOURN 30 STER DUAL PORT (MISCELLANEOUS) ×3 IMPLANT
DRAPE SHEET LG 3/4 BI-LAMINATE (DRAPES) ×3 IMPLANT
DRSG DERMACEA 8X12 NADH (GAUZE/BANDAGES/DRESSINGS) ×3 IMPLANT
DRSG OPSITE POSTOP 4X14 (GAUZE/BANDAGES/DRESSINGS) ×3 IMPLANT
DRSG TEGADERM 4X4.75 (GAUZE/BANDAGES/DRESSINGS) ×3 IMPLANT
DURAPREP 26ML APPLICATOR (WOUND CARE) ×6 IMPLANT
ELECT CAUTERY BLADE 6.4 (BLADE) ×3 IMPLANT
ELECT REM PT RETURN 9FT ADLT (ELECTROSURGICAL) ×3
ELECTRODE REM PT RTRN 9FT ADLT (ELECTROSURGICAL) ×1 IMPLANT
EX-PIN ORTHOLOCK NAV 4X150 (PIN) ×6 IMPLANT
GLOVE BIOGEL M STRL SZ7.5 (GLOVE) ×6 IMPLANT
GLOVE BIOGEL PI IND STRL 9 (GLOVE) ×1 IMPLANT
GLOVE BIOGEL PI INDICATOR 9 (GLOVE) ×2
GLOVE INDICATOR 8.0 STRL GRN (GLOVE) ×3 IMPLANT
GLOVE SURG SYN 9.0  PF PI (GLOVE) ×2
GLOVE SURG SYN 9.0 PF PI (GLOVE) ×1 IMPLANT
GOWN STRL REUS W/ TWL LRG LVL3 (GOWN DISPOSABLE) ×2 IMPLANT
GOWN STRL REUS W/TWL 2XL LVL3 (GOWN DISPOSABLE) ×3 IMPLANT
GOWN STRL REUS W/TWL LRG LVL3 (GOWN DISPOSABLE) ×4
HANDPIECE INTERPULSE COAX TIP (DISPOSABLE) ×2
HEMOVAC 400CC 10FR (MISCELLANEOUS) ×3 IMPLANT
HOLDER FOLEY CATH W/STRAP (MISCELLANEOUS) ×3 IMPLANT
HOOD PEEL AWAY FLYTE STAYCOOL (MISCELLANEOUS) ×6 IMPLANT
KIT RM TURNOVER STRD PROC AR (KITS) ×3 IMPLANT
KNIFE SCULPS 14X20 (INSTRUMENTS) ×3 IMPLANT
LABEL OR SOLS (LABEL) ×3 IMPLANT
NDL SAFETY 18GX1.5 (NEEDLE) ×3 IMPLANT
NEEDLE SPNL 20GX3.5 QUINCKE YW (NEEDLE) ×3 IMPLANT
NS IRRIG 500ML POUR BTL (IV SOLUTION) ×3 IMPLANT
PACK TOTAL KNEE (MISCELLANEOUS) ×3 IMPLANT
PAD WRAPON POLAR KNEE (MISCELLANEOUS) ×1 IMPLANT
PIN DRILL QUICK PACK ×3 IMPLANT
PIN FIXATION 1/8DIA X 3INL (PIN) ×3 IMPLANT
SET HNDPC FAN SPRY TIP SCT (DISPOSABLE) ×1 IMPLANT
SOL .9 NS 3000ML IRR  AL (IV SOLUTION) ×2
SOL .9 NS 3000ML IRR UROMATIC (IV SOLUTION) ×1 IMPLANT
SOL PREP PVP 2OZ (MISCELLANEOUS) ×3
SOLUTION PREP PVP 2OZ (MISCELLANEOUS) ×1 IMPLANT
SPONGE DRAIN TRACH 4X4 STRL 2S (GAUZE/BANDAGES/DRESSINGS) ×3 IMPLANT
STAPLER SKIN PROX 35W (STAPLE) ×3 IMPLANT
STRAP TIBIA SHORT (MISCELLANEOUS) ×3 IMPLANT
SUCTION FRAZIER HANDLE 10FR (MISCELLANEOUS) ×2
SUCTION TUBE FRAZIER 10FR DISP (MISCELLANEOUS) ×1 IMPLANT
SUT VIC AB 0 CT1 36 (SUTURE) ×3 IMPLANT
SUT VIC AB 1 CT1 36 (SUTURE) ×6 IMPLANT
SUT VIC AB 2-0 CT2 27 (SUTURE) ×3 IMPLANT
SYR 20CC LL (SYRINGE) ×3 IMPLANT
SYR 30ML LL (SYRINGE) ×6 IMPLANT
TOWEL OR 17X26 4PK STRL BLUE (TOWEL DISPOSABLE) ×3 IMPLANT
TOWER CARTRIDGE SMART MIX (DISPOSABLE) ×3 IMPLANT
WRAPON POLAR PAD KNEE (MISCELLANEOUS) ×3

## 2016-04-01 NOTE — Anesthesia Post-op Follow-up Note (Cosign Needed)
Anesthesia QCDR form completed.        

## 2016-04-01 NOTE — OR Nursing (Signed)
To OR with thermal cap in place. Sacral drsg, Ancef 2 gm, TXA to OR with patient

## 2016-04-01 NOTE — Progress Notes (Signed)
FBS 101

## 2016-04-01 NOTE — Anesthesia Preprocedure Evaluation (Signed)
Anesthesia Evaluation  Patient identified by MRN, date of birth, ID band Patient awake    Reviewed: Allergy & Precautions, H&P , NPO status , Patient's Chart, lab work & pertinent test results  History of Anesthesia Complications Negative for: history of anesthetic complications  Airway Mallampati: III  TM Distance: <3 FB Neck ROM: limited    Dental  (+) Poor Dentition, Chipped, Caps   Pulmonary neg pulmonary ROS, neg shortness of breath,    Pulmonary exam normal breath sounds clear to auscultation       Cardiovascular Exercise Tolerance: Good hypertension, (-) angina(-) Past MI and (-) DOE Normal cardiovascular exam Rhythm:regular Rate:Normal     Neuro/Psych negative neurological ROS  negative psych ROS   GI/Hepatic Neg liver ROS, GERD  Controlled and Medicated,  Endo/Other  diabetes, Type 2  Renal/GU      Musculoskeletal   Abdominal   Peds  Hematology negative hematology ROS (+)   Anesthesia Other Findings Past Medical History: No date: Achalasia No date: Barrett's esophagus with dysplasia No date: Diabetes mellitus without complication (HCC) No date: GERD (gastroesophageal reflux disease) No date: Hyperlipidemia No date: Hypertension No date: MRSA infection     Comment: lung No date: Thyroid disease  Past Surgical History: No date: ABDOMINAL HYSTERECTOMY No date: achalasia No date: HIATAL HERNIA REPAIR No date: THYROID EXPLORATION  BMI    Body Mass Index:  26.57 kg/m      Reproductive/Obstetrics negative OB ROS                             Anesthesia Physical Anesthesia Plan  ASA: III  Anesthesia Plan: Spinal   Post-op Pain Management:    Induction:   Airway Management Planned:   Additional Equipment:   Intra-op Plan:   Post-operative Plan:   Informed Consent: I have reviewed the patients History and Physical, chart, labs and discussed the procedure  including the risks, benefits and alternatives for the proposed anesthesia with the patient or authorized representative who has indicated his/her understanding and acceptance.   Dental Advisory Given  Plan Discussed with: Anesthesiologist, CRNA and Surgeon  Anesthesia Plan Comments: (Patient reports no problems with local anesthetics since trouble at the dentist many years ago.  She reports that she had "heart racing" with injection in her gums.  No rash, face swelling or other allergic symptom with that "reaction".  This sound like an intravascular injection of local anesthetic with epinephrine.  Patient consented for spinal with backup GA.  She voiced understanding.)        Anesthesia Quick Evaluation

## 2016-04-01 NOTE — OR Nursing (Signed)
Lab tech in for abo/rh draw @ 1040 am

## 2016-04-01 NOTE — Progress Notes (Signed)
Tolerated getting up on the side of the bed this evening.

## 2016-04-01 NOTE — Anesthesia Procedure Notes (Signed)
Spinal  Patient location during procedure: OR Start time: 04/01/2016 12:16 PM End time: 04/01/2016 12:18 PM Staffing Anesthesiologist: Katy Fitch K Performed: anesthesiologist  Preanesthetic Checklist Completed: patient identified, site marked, surgical consent, pre-op evaluation, timeout performed, IV checked, risks and benefits discussed and monitors and equipment checked Spinal Block Patient position: sitting Prep: ChloraPrep Patient monitoring: heart rate, continuous pulse ox, blood pressure and cardiac monitor Approach: midline Location: L4-5 Injection technique: single-shot Needle Needle type: Whitacre and Introducer  Needle gauge: 24 G Needle length: 9 cm Assessment Sensory level: T10 Additional Notes Negative paresthesia. Negative blood return. Positive free-flowing CSF. Expiration date of kit checked and confirmed. Patient tolerated procedure well, without complications.

## 2016-04-01 NOTE — Op Note (Signed)
OPERATIVE NOTE  DATE OF SURGERY:  04/01/2016  PATIENT NAME:  Deanna Bird   DOB: [birth year]  MRN: [id number]  PRE-OPERATIVE DIAGNOSIS: Degenerative arthrosis of the right knee, primary  POST-OPERATIVE DIAGNOSIS:  Same  PROCEDURE:  Right total knee arthroplasty using computer-assisted navigation  SURGEON:  Marciano Sequin. M.D.  ASSISTANT:  Vance Peper, PA (present and scrubbed throughout the case, critical for assistance with exposure, retraction, instrumentation, and closure)  ANESTHESIA: spinal  ESTIMATED BLOOD LOSS: 50 mL  FLUIDS REPLACED: 1700 mL of crystalloid  TOURNIQUET TIME: 119 minutes  DRAINS: 2 medium drains to a reinfusion system  SOFT TISSUE RELEASES: Anterior cruciate ligament, posterior cruciate ligament, deep medial collateral ligament, patellofemoral ligament, posterolateral corner, and pie crusting of the IT band  IMPLANTS UTILIZED: DePuy Attune size 3 posterior stabilized femoral component (cemented), size 4 rotating platform tibial component (cemented), 32 mm medialized dome patella (cemented), and a 5 mm stabilized rotating platform polyethylene insert.  INDICATIONS FOR SURGERY: Deanna Bird is a 83 y.o. year old female with a long history of progressive knee pain. X-rays demonstrated severe degenerative changes in tricompartmental fashion. The patient had not seen any significant improvement despite conservative nonsurgical intervention. After discussion of the risks and benefits of surgical intervention, the patient expressed understanding of the risks benefits and agree with plans for total knee arthroplasty.   The risks, benefits, and alternatives were discussed at length including but not limited to the risks of infection, bleeding, nerve injury, stiffness, blood clots, the need for revision surgery, cardiopulmonary complications, among others, and they were willing to proceed.  PROCEDURE IN DETAIL: The patient was brought into the operating room and,  after adequate spinal anesthesia was achieved, a tourniquet was placed on the patient's upper thigh. The patient's knee and leg were cleaned and prepped with alcohol and DuraPrep and draped in the usual sterile fashion. A "timeout" was performed as per usual protocol. The lower extremity was exsanguinated using an Esmarch, and the tourniquet was inflated to 300 mmHg. An anterior longitudinal incision was made followed by a standard mid vastus approach. The deep fibers of the medial collateral ligament were elevated in a subperiosteal fashion off of the medial flare of the tibia so as to maintain a continuous soft tissue sleeve. The patella was subluxed laterally and the patellofemoral ligament was incised. Inspection of the knee demonstrated severe degenerative changes with full-thickness loss of articular cartilage. Osteophytes were debrided using a rongeur. Anterior and posterior cruciate ligaments were excised. Two 4.0 mm Schanz pins were inserted in the femur and into the tibia for attachment of the array of trackers used for computer-assisted navigation. Hip center was identified using a circumduction technique. Distal landmarks were mapped using the computer. The distal femur and proximal tibia were mapped using the computer. The distal femoral cutting guide was positioned using computer-assisted navigation so as to achieve a 5 distal valgus cut. The femur was sized and it was felt that a size 3 femoral component was appropriate. A size 3 femoral cutting guide was positioned and the anterior cut was performed and verified using the computer. This was followed by completion of the posterior and chamfer cuts. Femoral cutting guide for the central box was then positioned in the center box cut was performed.  Attention was then directed to the proximal tibia. Medial and lateral menisci were excised. The extramedullary tibial cutting guide was positioned using computer-assisted navigation so as to achieve a 0  varus-valgus alignment and 3 posterior slope.  The cut was performed and verified using the computer. The proximal tibia was sized and it was felt that a size 4 tibial tray was appropriate. Tibial and femoral trials were inserted followed by insertion of a 5 mm polyethylene insert. The knee was felt to be tight laterally. The trial components removed and the knee was brought into full extension and distracted using the Moreland retractors. The posterolateral corner was carefully released using combination of electrocautery and Metzenbaum scissors. The IT band was still noted to be extremely tight and it was elected to perform high crusting type incisions along the IT band. Trial components were reinserted with a 5 mm polyethylene trial. This allowed for excellent mediolateral soft tissue balancing both in flexion and in full extension. Finally, the patella was cut and prepared so as to accommodate a 35 mm medialized dome patella. A patella trial was placed and the knee was placed through a range of motion with excellent patellar tracking appreciated. The femoral trial was removed after debridement of posterior osteophytes. The central post-hole for the tibial component was reamed followed by insertion of a keel punch. Tibial trials were then removed. Cut surfaces of bone were irrigated with copious amounts of normal saline with antibiotic solution using pulsatile lavage and then suctioned dry. Polymethylmethacrylate cement with gentamicin was prepared in the usual fashion using a vacuum mixer. Cement was applied to the cut surface of the proximal tibia as well as along the undersurface of a size 4 rotating platform tibial component. Tibial component was positioned and impacted into place. Excess cement was removed using [employer] fast streamer. Cement was then applied to the cut surfaces of the femur as well as along the posterior flanges of the size 3 femoral component. The femoral component was positioned and impacted  into place. Excess cement was removed using [employer] fast streamer. A 5 mm polyethylene trial was inserted and the knee was brought into full extension with steady axial compression applied. Finally, cement was applied to the backside of a 32 mm medialized dome patella and the patellar component was positioned and patellar clamp applied. Excess cement was removed using [employer] fast streamer. After adequate curing of the cement, the tourniquet was deflated after a total tourniquet time of 119 minutes. Hemostasis was achieved using electrocautery. The knee was irrigated with copious amounts of normal saline with antibiotic solution using pulsatile lavage and then suctioned dry. 20 mL of 1.3% Exparel and 60 mL of 0.25% Marcaine in 40 mL of normal saline was injected along the posterior capsule, medial and lateral gutters, and along the arthrotomy site. A 5 mm stabilized rotating platform polyethylene insert was inserted and the knee was placed through a range of motion with excellent mediolateral soft tissue balancing appreciated and excellent patellar tracking noted. 2 medium drains were placed in the wound bed and brought out through separate stab incisions to be attached to a reinfusion system. The medial parapatellar portion of the incision was reapproximated using interrupted sutures of #1 Vicryl. Subcutaneous tissue was approximated in layers using first #0 Vicryl followed #2-0 Vicryl. The skin was approximated with skin staples. A sterile dressing was applied.  The patient tolerated the procedure well and was transported to the recovery room in stable condition.    Myha Arizpe P. Holley Bouche., M.D.

## 2016-04-01 NOTE — Transfer of Care (Signed)
Immediate Anesthesia Transfer of Care Note  Patient: Deanna Bird  Procedure(s) Performed: Procedure(s): COMPUTER ASSISTED TOTAL KNEE ARTHROPLASTY (Right)  Patient Location: PACU  Anesthesia Type:Spinal  Level of Consciousness: awake and alert   Airway & Oxygen Therapy: Patient Spontanous Breathing and Patient connected to face mask oxygen  Post-op Assessment: Report given to RN and Post -op Vital signs reviewed and stable  Post vital signs: Reviewed and stable  Last Vitals:  Vitals:   04/01/16 1027  BP: (!) 139/51  Pulse: 67  Resp: 18  Temp: 36.6 C    Last Pain:  Vitals:   04/01/16 1027  TempSrc: Oral         Complications: No apparent anesthesia complications

## 2016-04-01 NOTE — H&P (Signed)
The patient has been re-examined, and the chart reviewed, and there have been no interval changes to the documented history and physical.    The risks, benefits, and alternatives have been discussed at length. The patient expressed understanding of the risks benefits and agreed with plans for surgical intervention.  Kelcy Baeten P. Eyoel Throgmorton, Jr. M.D.    

## 2016-04-01 NOTE — NC FL2 (Signed)
[place] LEVEL OF CARE SCREENING TOOL     IDENTIFICATION  Patient Name: Deanna Bird Birthdate: 02-Jun-1928 Sex: female Admission Date (Current Location): 04/01/2016  [place] and [place] Number:  Engineering geologist and Address:  [facility], [address], [place], [place] [address]      Provider Number: [id number]  Attending Physician Name and Address:  Dereck Leep, MD  Relative Name and Phone Number:       Current Level of Care: Hospital Recommended Level of Care: [place] Prior Approval Number:    Date Approved/Denied:   PASRR Number:  [id number])  Discharge Plan: SNF    Current Diagnoses: Patient Active Problem List   Diagnosis Date Noted  . Primary osteoarthritis of right knee 03/19/2016  . Hyperlipidemia 11/19/2014  . Diabetes mellitus without complication ([place]) 00/92/3300  . Nonspecific abnormal results of thyroid function study 07/18/2014  . Hypertension 07/18/2014    Orientation RESPIRATION BLADDER Height & Weight     Self, Time, Situation, Place  Normal Continent Weight: 150 lb (68 kg) Height:  5\' 3"  (160 cm)  BEHAVIORAL SYMPTOMS/MOOD NEUROLOGICAL BOWEL NUTRITION STATUS   (none)  (none) Continent Diet (NPO for surgery )  AMBULATORY STATUS COMMUNICATION OF NEEDS Skin   Extensive Assist Verbally Surgical wounds (Incision: Right Knee )                       Personal Care Assistance Level of Assistance  Bathing, Feeding, Dressing Bathing Assistance: Limited assistance Feeding assistance: Independent Dressing Assistance: Limited assistance     Functional Limitations Info  Sight, Hearing, Speech Sight Info: Adequate Hearing Info: Adequate Speech Info: Adequate    SPECIAL CARE FACTORS FREQUENCY  PT (By licensed PT), OT (By licensed OT)     PT Frequency:  (5) OT Frequency:  (5)            Contractures      Additional Factors Info  Code Status, Allergies Code Status  Info:  (not on file ) Allergies Info:  (Procaine)           Current Medications (04/01/2016):  This is the current hospital active medication list Current Facility-Administered Medications  Medication Dose Route Frequency Provider Last Rate Last Dose  . 0.9 %  sodium chloride infusion   Intravenous Continuous Martha Clan, MD 20 mL/hr at 04/01/16 1100    . bupivacaine (PF) (MARCAINE) 0.25 % injection    PRN Dereck Leep, MD   60 mL at 04/01/16 1454  . bupivacaine liposome (EXPAREL 1.3%) in normal saline Optime    PRN Dereck Leep, MD   60 mL at 04/01/16 1455  . ceFAZolin (ANCEF) 2-4 GM/100ML-% IVPB           . chlorhexidine (HIBICLENS) 4 % liquid 4 application  60 mL Topical Once Dereck Leep, MD      . neomycin-polymyxin B (NEOSPORIN) irrigation solution    PRN Dereck Leep, MD   16 mL at 04/01/16 1315   Facility-Administered Medications Ordered in Other Encounters  Medication Dose Route Frequency Provider Last Rate Last Dose  . bupivacaine (MARCAINE) 0.5 % injection   Other Anesthesia Intra-op Jennette Bill   2 mL at 04/01/16 1218  . ceFAZolin (ANCEF) IVPB 2 g/50 mL premix   Intravenous Anesthesia Intra-op Jennette Bill   2 g at 04/01/16 1234  . ePHEDrine injection   Intravenous Anesthesia Intra-op Jennette Bill   10 mg at  04/01/16 1400  . fentaNYL (SUBLIMAZE) injection    Anesthesia Intra-op Jennette Bill   5 mcg at 04/01/16 1240  . lactated ringers infusion    Continuous PRN Jennette Bill      . midazolam (VERSED) 5 MG/5ML injection    Anesthesia Intra-op Jennette Bill   0.25 mg at 04/01/16 1238  . phenylephrine (NEO-SYNEPHRINE) injection   Intravenous Anesthesia Intra-op Jennette Bill   50 mcg at 04/01/16 1534  . propofol (DIPRIVAN) 500 MG/50ML infusion    Continuous PRN Clyde Gilbert 30.6 mL/hr at 04/01/16 1345 75 mcg/kg/min at 04/01/16 1345  . tetracaine 1 % injection   Intraspinal Anesthesia Intra-op Jennette Bill   1 mg at 04/01/16 1218  . tranexamic acid (CYKLOKAPRON)  1,000 mg in sodium chloride 0.9 % 100 mL IVPB   Intravenous Continuous PRN Clyde Gilbert   1,000 mg at 04/01/16 1225     Discharge Medications: Please see discharge summary for a list of discharge medications.  Relevant Imaging Results:  Relevant Lab Results:   Additional Information  (SSN: [id number])  Sample, Veronia Beets, LCSW

## 2016-04-02 ENCOUNTER — Encounter: Payer: Self-pay | Admitting: Orthopedic Surgery

## 2016-04-02 ENCOUNTER — Encounter: Payer: Self-pay | Admitting: *Deleted

## 2016-04-02 LAB — BASIC METABOLIC PANEL
Anion gap: 4 — ABNORMAL LOW (ref 5–15)
BUN: 24 mg/dL — AB (ref 6–20)
CALCIUM: 7.5 mg/dL — AB (ref 8.9–10.3)
CO2: 27 mmol/L (ref 22–32)
CREATININE: 1.33 mg/dL — AB (ref 0.44–1.00)
Chloride: 110 mmol/L (ref 101–111)
GFR calc Af Amer: 41 mL/min — ABNORMAL LOW (ref >60)
GFR, EST NON AFRICAN AMERICAN: 35 mL/min — AB (ref >60)
Glucose, Bld: 118 mg/dL — ABNORMAL HIGH (ref 65–99)
Potassium: 3.7 mmol/L (ref 3.5–5.1)
SODIUM: 141 mmol/L (ref 135–145)

## 2016-04-02 LAB — GLUCOSE, CAPILLARY
GLUCOSE-CAPILLARY: 101 mg/dL — AB (ref 65–99)
GLUCOSE-CAPILLARY: 98 mg/dL (ref 65–99)
GLUCOSE-CAPILLARY: 136 mg/dL — AB (ref 65–99)
Glucose-Capillary: 133 mg/dL — ABNORMAL HIGH (ref 65–99)
Glucose-Capillary: 105 mg/dL — ABNORMAL HIGH (ref 65–99)

## 2016-04-02 LAB — CBC
HCT: 29.9 % — ABNORMAL LOW (ref 35.0–47.0)
Hemoglobin: 10.2 g/dL — ABNORMAL LOW (ref 12.0–16.0)
MCH: 30.4 pg (ref 26.0–34.0)
MCHC: 34.2 g/dL (ref 32.0–36.0)
MCV: 89 fL (ref 80.0–100.0)
PLATELETS: 153 10*3/uL (ref 150–440)
RBC: 3.36 MIL/uL — AB (ref 3.80–5.20)
RDW: 14.2 % (ref 11.5–14.5)
WBC: 9.2 10*3/uL (ref 3.6–11.0)

## 2016-04-02 MED ORDER — ENOXAPARIN SODIUM 30 MG/0.3ML ~~LOC~~ SOLN
30.0000 mg | SUBCUTANEOUS | Status: DC
  Administered 2016-04-03: 30 mg via SUBCUTANEOUS
  Filled 2016-04-02: qty 0.3

## 2016-04-02 NOTE — Progress Notes (Signed)
Inpatient Diabetes Program Recommendations  AACE/ADA: New Consensus Statement on Inpatient Glycemic Control (2015)  Target Ranges:  Prepandial:   less than 140 mg/dL      Peak postprandial:   less than 180 mg/dL (1-2 hours)      Critically ill patients:  140 - 180 mg/dL   Results for PERRI, ARAGONES (MRN [id number]) as of 04/02/2016 10:16  Ref. Range 04/01/2016 10:30 04/01/2016 15:08 04/01/2016 16:27 04/01/2016 19:54 04/02/2016 08:23  Glucose-Capillary Latest Ref Range: 65 - 99 mg/dL 74 60 (L) 59 (L) 101 (H) 98   Review of Glycemic Control  Diabetes history: DM2 Outpatient Diabetes medications: Amaryl 2 mg QAM Current orders for Inpatient glycemic control: Amaryl 2 mg QAm, Novolog 0-15 units TID with meals  Inpatient Diabetes Program Recommendations: Correction (SSI): Please consider ordering Novolog 0-5 units QHS for bedtime correction. Oral Agents: Please discontinue Amaryl while inpatient. Diet: Please consider changing diet from Regular to Carb Modified.  Thanks, Barnie Alderman, RN, MSN, CDE Diabetes Coordinator Inpatient Diabetes Program [phone] (Team Pager from 8am to 5pm)

## 2016-04-02 NOTE — Care Management Note (Signed)
Case Management Note  Patient Details  Name: Deanna Bird MRN: [id number] Date of Birth: 09-24-1928  Subjective/Objective: Met with patient to discuss discharge planning. Her daughters were at bedside as well. Discussed discharge planning. Patient lives at home with her spouse. He daughter will be staying with her as long as she needs her too. Offered choice of home health agencies. Referral to [facility] for HHPT. Patient has a walker and BSC. No DME needed. Pharmacy: [facility] [phone]. Called Lovenox 40 mg # 14 no refills.  PCP is [facility]                  Action/Plan: [facility] for HHPT  Expected Discharge Date:                  Expected Discharge Plan:  [place]  In-House Referral:     Discharge planning Services  CM Consult  Post Acute Care Choice:  Home Health Choice offered to:  Patient, Adult Children  DME Arranged:    DME Agency:     HH Arranged:  PT [place]:  [place] (now [facility])  Status of Service:  In process, will continue to follow  If discussed at Long Length of Stay Meetings, dates discussed:    Additional Comments:  [name], RN 04/02/2016, 4:11 PM

## 2016-04-02 NOTE — Progress Notes (Signed)
Up to bedside commode this morning had post foley void.

## 2016-04-02 NOTE — Progress Notes (Signed)
ORTHOPAEDICS PROGRESS NOTE  PATIENT NAME: Deanna Bird DOB: 08/01/1928  MRN: [id number]  POD # 1: Right total knee arthroplasty  Subjective: The patient reports the pain to be well controlled. No nausea or vomiting. She tolerated sitting at the side of the bed well last night.  Objective: Vital signs in last 24 hours: Temp:  [97.2 F (36.2 C)-98.4 F (36.9 C)] 97.7 F (36.5 C) (03/29 0250) Pulse Rate:  [57-86] 57 (03/29 0250) Resp:  [7-22] 16 (03/29 0250) BP: (106-159)/(44-112) 106/50 (03/29 0250) SpO2:  [92 %-100 %] 100 % (03/29 0250) Weight:  [68 kg (150 lb)] 68 kg (150 lb) (03/28 1027)  Intake/Output from previous day: 03/28 0701 - 03/29 0700 In: 2935 [I.V.:2375; IV Piggyback:560] Out: 1270 [Urine:940; Drains:280; Blood:50]   Recent Labs  04/02/16 0446  WBC 9.2  HGB 10.2*  HCT 29.9*  PLT 153  K 3.7  CL 110  CO2 27  BUN 24*  CREATININE 1.33*  GLUCOSE 118*  CALCIUM 7.5*    EXAM General: Well-developed, well-nourished female seen in no acute distress. Lungs: clear to auscultation Cardiac: normal rate, regular rhythm, normal S1, S2, no murmurs, rubs, clicks or gallops, normal rate and regular rhythm Right lower extremity: Dressing is dry and intact. Bone foam is in place. Good quadriceps tone. The patient is able to perform independent straight leg raise. Homans test is negative. Neurologic: Awake, alert, and oriented. Sensory motor function is grossly intact.  Assessment: Right total knee arthroplasty  Secondary diagnoses: Acute blood loss anemia secondary to surgery Hypertension Hyperlipidemia Gastroesophageal reflux disease Diabetes Barrett's esophagus with dysplasia Achalasia  Plan: Begin physical therapy and occupational therapy as per total knee arthroplasty rehabilitation protocol. Today's goal were reviewed with the patient.  Plan is to go Home after hospital stay. DVT Prophylaxis - Lovenox, Foot Pumps and TED hose  [name]  M.D.

## 2016-04-02 NOTE — Plan of Care (Signed)
Problem: Safety: Goal: Ability to remain free from injury will improve Outcome: Progressing Remainig Free from falls this shift. Alert and oriented can make needs known.  Problem: Pain Managment: Goal: General experience of comfort will improve Outcome: Progressing Pain control with oral pain medication.  Problem: Skin Integrity: Goal: Risk for impaired skin integrity will decrease Outcome: Progressing Surgical dressing remaining dry and intact.  Problem: Nutrition: Goal: Adequate nutrition will be maintained Outcome: Progressing Eating and drinking without difficulty.

## 2016-04-02 NOTE — Progress Notes (Signed)
Clinical Social Worker (CSW) received SNF consult. PT is recommending home health. RN case manager aware of above. Please reconsult if future social work needs arise. CSW signing off.   Eretria Manternach, LCSW [phone] 

## 2016-04-02 NOTE — Progress Notes (Signed)
qPhysical Therapy Treatment Patient Details Name: Deanna Bird MRN: [id number] DOB: [birth year] Today's Date: 04/02/2016    History of Present Illness Pt is a 83 y/o F s/p R TKA.      PT Comments    Mrs. Postiglione is making excellent progress with mobility.  She ambulated 220 ft with min guard assist and use of RW this afternoon.  Cues were provided for improved posture and gait mechanics while ambulating.  Pt will benefit from continued skilled PT services to increase functional independence and safety.   Follow Up Recommendations  Home health PT     Equipment Recommendations  None recommended by PT    Recommendations for Other Services       Precautions / Restrictions Precautions Precautions: Fall;Knee Precaution Booklet Issued: No Precaution Comments: Instructed pt in no pillow under knee Required Braces or Orthoses: Knee Immobilizer - Right Knee Immobilizer - Right:  (if unable to perform SLR) Restrictions Weight Bearing Restrictions: Yes RLE Weight Bearing: Weight bearing as tolerated    Mobility  Bed Mobility Overal bed mobility: Needs Assistance Bed Mobility: Sit to Supine       Sit to supine: Supervision   General bed mobility comments: Bed rail down and HOB flat to simulate home environment.  Pt does not require physical assist or cues.  Transfers Overall transfer level: Needs assistance Equipment used: Rolling walker (2 wheeled) Transfers: Sit to/from Stand Sit to Stand: Min guard         General transfer comment: Pt is slow to stand but performs sit<>stand with safe technique and proper hand placement.  Ambulation/Gait Ambulation/Gait assistance: Min guard Ambulation Distance (Feet): 220 Feet Assistive device: Rolling walker (2 wheeled) Gait Pattern/deviations: Step-to pattern;Decreased stride length;Decreased stance time - right;Decreased step length - left;Decreased weight shift to right;Antalgic;Trunk flexed Gait velocity: decreased Gait  velocity interpretation: Below normal speed for age/gender General Gait Details: Cues for upright posture and forward gaze.  Cues to relax shoulders and to dec step length with RLE so allow for reciprocal step through gait pattern.   Stairs            Wheelchair Mobility    Modified Rankin (Stroke Patients Only)       Balance Overall balance assessment: Needs assistance;History of Falls Sitting-balance support: No upper extremity supported;Feet supported Sitting balance-Leahy Scale: Good     Standing balance support: During functional activity;No upper extremity supported Standing balance-Leahy Scale: Fair Standing balance comment: Pt able to stand statically without UE support but requires RW for dynamic activities                            Cognition Arousal/Alertness: Awake/alert Behavior During Therapy: WFL for tasks assessed/performed Overall Cognitive Status: Within Functional Limits for tasks assessed                                        Exercises Total Joint Exercises Quad Sets: Strengthening;Both;10 reps;Supine;Other (comment) (with 5 second holds) Straight Leg Raises: Strengthening;Right;10 reps;Supine Long Arc Quad: Strengthening;Right;10 reps;Seated Knee Flexion: AAROM;Right;5 reps;Seated;Other (comment) (with 5 second holds) Other Exercises Other Exercises: Lateral weight shifting in standing prior to ambulating. x10 each LE    General Comments        Pertinent Vitals/Pain Pain Assessment: Faces Faces Pain Scale: Hurts a little bit Pain Location: R knee Pain Descriptors / Indicators: Aching;Guarding  Pain Intervention(s): Limited activity within patient's tolerance;Monitored during session;Repositioned    Home Living Family/patient expects to be discharged to:: Private residence Living Arrangements: Spouse/significant other Available Help at Discharge: Family;Available 24 hours/day (dtr will stay with pt 24/7 for a  few days ) Type of Home: House Home Access: Ramped entrance   Home Layout: Multi-level Home Equipment: Walker - 2 wheels;Cane - single point;Shower seat;Toilet riser;Grab bars - tub/shower;Hand held shower head;Adaptive equipment Additional Comments: Pt has a ramp to enter her home.  Pt lives in a trilevel home.  8 steps up to living room (there is a bedroom on this floor), another 8 steps up to her bedroom. L railing on both stair cases.  There is a walk in shower on the main level.  There is a tub shower unit on the top floor where her bedroom/bathroom is.    Prior Function Level of Independence: Independent with assistive device(s)      Comments: Pt was ambulating with SPC when out of her house.  She enjoys working in her flower garden.  Reports 2 falls around Thanksgiving of last year in the yard ("lost my balance").  Does her own cooking, driving, has cleaners come weekly. Has a small dog who will be gated when pt goes home   PT Goals (current goals can now be found in the care plan section) Acute Rehab PT Goals Patient Stated Goal: to go home PT Goal Formulation: With patient Time For Goal Achievement: 04/16/16 Potential to Achieve Goals: Good Progress towards PT goals: Progressing toward goals    Frequency    BID      PT Plan Current plan remains appropriate    Co-evaluation             End of Session Equipment Utilized During Treatment: Gait belt Activity Tolerance: Patient tolerated treatment well Patient left: with call bell/phone within reach;Other (comment);in bed;with bed alarm set;with family/visitor present;with SCD's reapplied (with polar care and bone foam in place) Nurse Communication: Mobility status PT Visit Diagnosis: Pain;Unsteadiness on feet (R26.81) Pain - Right/Left: Right Pain - part of body: Knee     Time: 1216-2446 PT Time Calculation (min) (ACUTE ONLY): 26 min  Charges:  $Gait Training: 8-22 mins $Therapeutic Exercise: 8-22 mins                     G Codes:       Collie Siad PT, DPT 04/02/2016, 4:21 PM

## 2016-04-02 NOTE — Progress Notes (Signed)
Anticoagulation monitoring(Lovenox):  83yo  F ordered Lovenox 30 mg Q12h  Filed Weights   04/01/16 1027  Weight: 150 lb (68 kg)   BMI 26.6   Lab Results  Component Value Date   CREATININE 1.33 (H) 04/02/2016   CREATININE 1.13 (H) 03/20/2016   CREATININE 1.27 (H) 10/28/2015   Estimated Creatinine Clearance: 28.6 mL/min (A) (by C-G formula based on SCr of 1.33 mg/dL (H)). Hemoglobin & Hematocrit     Component Value Date/Time   HGB 10.2 (L) 04/02/2016 0446   HCT 29.9 (L) 04/02/2016 0446   HCT 38.7 04/09/2015 1103     Per Protocol for Patient with estCrcl < 30 ml/min and BMI < 40, will transition to Lovenox 30 mg Q24h     Chinita Greenland PharmD Clinical Pharmacist 04/02/2016

## 2016-04-02 NOTE — Evaluation (Signed)
Physical Therapy Evaluation Patient Details Name: Deanna Bird MRN: [id number] DOB: [birth year] Today's Date: 04/02/2016   History of Present Illness  Pt is a 83 y/o F s/p R TKA.    Clinical Impression  Pt is s/p R TKA resulting in the deficits listed below (see PT Problem List). Deanna Bird was Ind using SPC PTA.  She currently requires min guard assist for bed mobility, sit<>stand transfers, and short distance ambulation.  Session was limited due to pt's nausea which she has been experiencing since surgery.  She will have 24/7 assist/supervision for safety at d/c from her daughter and husband.  Pt will benefit from skilled PT to increase their independence and safety with mobility to allow discharge to the venue listed below.     Follow Up Recommendations Home health PT    Equipment Recommendations  None recommended by PT    Recommendations for Other Services OT consult     Precautions / Restrictions Precautions Precautions: Fall;Knee Precaution Booklet Issued: No Precaution Comments: Instructed pt in no pillow under knee Required Braces or Orthoses: Knee Immobilizer - Right Knee Immobilizer - Right:  (if unable to perform SLR) Restrictions Weight Bearing Restrictions: Yes RLE Weight Bearing: Weight bearing as tolerated      Mobility  Bed Mobility Overal bed mobility: Needs Assistance Bed Mobility: Supine to Sit     Supine to sit: HOB elevated;Min guard     General bed mobility comments: Increased time and effort.  Pt does no require physical assist or cues.  Transfers Overall transfer level: Needs assistance Equipment used: Rolling walker (2 wheeled) Transfers: Sit to/from Stand Sit to Stand: Min guard         General transfer comment: Pt is slow to stand and requires cues for hand placement and proper technique using RW.  When preparing to sit cues provided to reach back for armrests and for controlled descent which pt performs  well.  Ambulation/Gait Ambulation/Gait assistance: Min guard Ambulation Distance (Feet): 50 Feet Assistive device: Rolling walker (2 wheeled) Gait Pattern/deviations: Step-to pattern;Decreased stride length;Decreased stance time - right;Decreased step length - left;Decreased weight shift to right;Antalgic;Trunk flexed Gait velocity: decreased Gait velocity interpretation: Below normal speed for age/gender General Gait Details: Cues for upright posture and forward gaze as well as sequencing using RW.  Pt reports nausea after ambulating ~25 ft.  Stairs            Wheelchair Mobility    Modified Rankin (Stroke Patients Only)       Balance Overall balance assessment: Needs assistance;History of Falls Sitting-balance support: No upper extremity supported;Feet supported Sitting balance-Leahy Scale: Good     Standing balance support: During functional activity;No upper extremity supported Standing balance-Leahy Scale: Fair Standing balance comment: Pt able to stand statically without UE support but requires RW for dynamic activities                             Pertinent Vitals/Pain Pain Assessment: Faces Faces Pain Scale: Hurts little more Pain Location: R knee Pain Descriptors / Indicators: Aching;Grimacing;Guarding Pain Intervention(s): Limited activity within patient's tolerance;Monitored during session;Repositioned;Other (comment) (Polar ice in place at end of session)    Home Living Family/patient expects to be discharged to:: Private residence Living Arrangements: Spouse/significant other Available Help at Discharge: Family;Available 24 hours/day (daughter will be staying with pt 24/7 at d/c as long as need) Type of Home: House Home Access: [place]  Layout: Multi-level Home Equipment: Walker - 2 wheels;Cane - single point;Shower seat;Toilet riser;Grab bars - tub/shower;Hand held shower head Additional Comments: Pt has a ramp to enter her  home.  Pt lives in a trilevel home.  8 steps up to living room (there is a bedroom on this floor), another 8 steps up to her bedroom. L railing on both stair cases.  There is a walk in shower on the main level.  There is a tub shower unit on the top floor where her bedroom/bathroom is.    Prior Function Level of Independence: Independent with assistive device(s)         Comments: Pt was ambulating with SPC when out of her house.  She enjoys working in her flower garden.  Reports 2 falls around Thanksgiving of last year.  Does her own cooking, cleaning, driving.     Hand Dominance   Dominant Hand: Right    Extremity/Trunk Assessment   Upper Extremity Assessment Upper Extremity Assessment: Overall WFL for tasks assessed    Lower Extremity Assessment Lower Extremity Assessment: RLE deficits/detail (LLE strength grossly 4+/5) RLE Deficits / Details: Strength grossly at least 3/5       Communication   Communication: No difficulties  Cognition Arousal/Alertness: Awake/alert Behavior During Therapy: WFL for tasks assessed/performed Overall Cognitive Status: Within Functional Limits for tasks assessed                                        General Comments General comments (skin integrity, edema, etc.): RN aware of pt's nausea as she had this symptoms overnight.    Exercises Total Joint Exercises Ankle Circles/Pumps: AROM;Both;10 reps;Supine Quad Sets: Strengthening;Both;10 reps;Supine;Other (comment) (with 5 second holds) Gluteal Sets: Strengthening;Both;10 reps;Supine;Other (comment) (with 5 second holds) Straight Leg Raises: Strengthening;Right;10 reps;Supine Long Arc Quad: Strengthening;Right;10 reps;Seated Knee Flexion: AAROM;Right;5 reps;Seated;Other (comment) (with 5 second holds) Goniometric ROM: 81 deg R knee flexion with AAROM exercise   Assessment/Plan    PT Assessment Patient needs continued PT services  PT Problem List Decreased  strength;Decreased range of motion;Decreased activity tolerance;Decreased balance;Decreased mobility;Decreased knowledge of use of DME;Decreased safety awareness;Decreased knowledge of precautions;Pain       PT Treatment Interventions DME instruction;Gait training;Stair training;Functional mobility training;Therapeutic activities;Therapeutic exercise;Balance training;Neuromuscular re-education;Patient/family education;Modalities    PT Goals (Current goals can be found in the Care Plan section)  Acute Rehab PT Goals Patient Stated Goal: to go home PT Goal Formulation: With patient Time For Goal Achievement: 04/16/16 Potential to Achieve Goals: Good    Frequency BID   Barriers to discharge        Co-evaluation               End of Session Equipment Utilized During Treatment: Gait belt Activity Tolerance: Other (comment) (limited by nausea) Patient left: in chair;with call bell/phone within reach;with chair alarm set;Other (comment) (with polar care and bone foam in place) Nurse Communication: Mobility status;Weight bearing status;Other (comment) (pt's nausea limiting session) PT Visit Diagnosis: Pain;Unsteadiness on feet (R26.81) Pain - Right/Left: Right Pain - part of body: Knee    Time: 4627-0350 PT Time Calculation (min) (ACUTE ONLY): 36 min   Charges:   PT Evaluation $PT Eval Low Complexity: 1 Procedure PT Treatments $Therapeutic Exercise: 8-22 mins   PT G Codes:        Collie Siad PT, DPT 04/02/2016, 9:37 AM

## 2016-04-02 NOTE — Anesthesia Postprocedure Evaluation (Signed)
Anesthesia Post Note  Patient: Gracynn Rajewski [place]  Procedure(s) Performed: Procedure(s) (LRB): COMPUTER ASSISTED TOTAL KNEE ARTHROPLASTY (Right)  Patient location during evaluation: Nursing Unit Anesthesia Type: Spinal Level of consciousness: awake and alert and oriented Pain management: satisfactory to patient Vital Signs Assessment: post-procedure vital signs reviewed and stable Respiratory status: spontaneous breathing and respiratory function stable Cardiovascular status: stable and blood pressure returned to baseline Postop Assessment: no headache, no backache, spinal receding, patient able to bend at knees, no signs of nausea or vomiting and adequate PO intake Anesthetic complications: no     Last Vitals:  Vitals:   04/01/16 2249 04/02/16 0250  BP: (!) 116/44 (!) 106/50  Pulse: 60 (!) 57  Resp: 18 16  Temp: 36.9 C 36.5 C    Last Pain:  Vitals:   04/02/16 0610  TempSrc:   PainSc: 2                  Blima Singer

## 2016-04-02 NOTE — Evaluation (Signed)
Occupational Therapy Evaluation Patient Details Name: Deanna Bird MRN: [id number] DOB: [birth year] Today's Date: 04/02/2016    History of Present Illness Pt is a 83 y/o F s/p R TKA.  PMHx significant for HTN, HLD, GERD, DM, Barrett's esophagus with dysphagia, achalasia, thyroid disease. POD#1 for OT evaluation.   Clinical Impression   Pt seen for OT evaluation this date, POD#1 s/p R TKA. Pt was active and independent at baseline and is eager to return to PLOF. Reports 2 falls in past 12 months due to "losing my balance in the yard." Pt educated in use of AE for LB dressing, home/routines modifications for maximizing functional independence and falls prevention. Pt would benefit from additional skilled OT services to address noted impairments in strength/ROM/coordination/knowledge of AE and compensatory strategies for self care tasks in order to maximize return to PLOF and minimize future falls risk. Recommend HHOT services upon discharge, pending pt's progress with negotiating stairs with PT.     Follow Up Recommendations  Home health OT    Equipment Recommendations  None recommended by OT    Recommendations for Other Services       Precautions / Restrictions Precautions Precautions: Fall;Knee Precaution Booklet Issued: No Required Braces or Orthoses: Knee Immobilizer - Right Knee Immobilizer - Right: Discontinue once straight leg raise with < 10 degree lag Restrictions Weight Bearing Restrictions: Yes RLE Weight Bearing: Weight bearing as tolerated      Mobility Bed Mobility               General bed mobility comments: did not assess, pt up in recliner at start of session  Transfers                 General transfer comment: deferred this session due to lunch and company arriving    Balance Overall balance assessment: Needs assistance;History of Falls Sitting-balance support: No upper extremity supported;Feet supported Sitting balance-Leahy Scale: Good                                      ADL either performed or assessed with clinical judgement   ADL Overall ADL's : Needs assistance/impaired Eating/Feeding: Set up;Sitting   Grooming: Set up;Sitting   Upper Body Bathing: Set up;Sitting;Supervision/ safety   Lower Body Bathing: Sitting/lateral leans;Sit to/from stand;Min guard   Upper Body Dressing : Sitting;Set up   Lower Body Dressing: Supervision/safety;Sitting/lateral leans Lower Body Dressing Details (indicate cue type and reason): Pt educated in use of AE for doff/donning LB dressing; pt demonstrated understanding, also able to don/doff sock on LLE without AE with good safety awareness/technique               General ADL Comments: Pt generally supervision level of ADL     Vision Baseline Vision/History: Wears glasses Wears Glasses: Distance only Patient Visual Report: No change from baseline Vision Assessment?: No apparent visual deficits     Perception     Praxis Praxis Praxis tested?: Within functional limits    Pertinent Vitals/Pain Pain Assessment: No/denies pain Pain Intervention(s): Monitored during session     Hand Dominance Right   Extremity/Trunk Assessment Upper Extremity Assessment Upper Extremity Assessment: Overall WFL for tasks assessed   Lower Extremity Assessment Lower Extremity Assessment: Defer to PT evaluation;RLE deficits/detail   Cervical / Trunk Assessment Cervical / Trunk Assessment: Normal   Communication Communication Communication: No difficulties   Cognition Arousal/Alertness: Awake/alert Behavior During Therapy: [facility]  for tasks assessed/performed Overall Cognitive Status: Within Functional Limits for tasks assessed                                     General Comments       Exercises Other Exercises Other Exercises: Pt educated in home/routines modifications and home set up to support safety, falls prevention, functional independence with  ADL   Shoulder Instructions      Home Living Family/patient expects to be discharged to:: Private residence Living Arrangements: Spouse/significant other Available Help at Discharge: Family;Available 24 hours/day (dtr will stay with pt 24/7 for a few days ) Type of Home: House Home Access: Ramped entrance     Home Layout: Multi-level Alternate Level Stairs-Number of Steps: 16 8x2 Alternate Level Stairs-Rails: Left Bathroom Shower/Tub: Occupational psychologist: Handicapped height Bathroom Accessibility: Yes How Accessible: Accessible via walker Home Equipment: [place] - 2 wheels;Cane - single point;Shower seat;Toilet riser;Grab bars - tub/shower;Hand held shower head;Adaptive equipment Adaptive Equipment: Reacher Additional Comments: Pt has a ramp to enter her home.  Pt lives in a trilevel home.  8 steps up to living room (there is a bedroom on this floor), another 8 steps up to her bedroom. L railing on both stair cases.  There is a walk in shower on the main level.  There is a tub shower unit on the top floor where her bedroom/bathroom is.      Prior Functioning/Environment Level of Independence: Independent with assistive device(s)        Comments: Pt was ambulating with SPC when out of her house.  She enjoys working in her flower garden.  Reports 2 falls around Thanksgiving of last year in the yard ("lost my balance").  Does her own cooking, driving, has cleaners come weekly. Has a small dog who will be gated when pt goes home        OT Problem List: Decreased strength;Decreased range of motion;Decreased activity tolerance;Decreased knowledge of use of DME or AE      OT Treatment/Interventions: Self-care/ADL training;Energy conservation;Patient/family education;DME and/or AE instruction    OT Goals(Current goals can be found in the care plan section) Acute Rehab OT Goals Patient Stated Goal: go home OT Goal Formulation: With patient/family Time For Goal  Achievement: 04/09/16 Potential to Achieve Goals: Good ADL Goals Pt Will Perform Lower Body Dressing: with adaptive equipment;sit to/from stand;sitting/lateral leans;with supervision (with stable BP) Pt Will Transfer to Toilet: with min guard assist;ambulating (comfort height toilet, RW for ambulation)  OT Frequency: Min 1X/week   Barriers to D/C:            Co-evaluation              End of Session    Activity Tolerance: Patient tolerated treatment well Patient left: in chair;with call bell/phone within reach;with chair alarm set;with family/visitor present;Other (comment) (polar care reapplied)  OT Visit Diagnosis: Other abnormalities of gait and mobility (R26.89);History of falling (Z91.81);Muscle weakness (generalized) (M62.81)                Time: 0630-1601 OT Time Calculation (min): 33 min Charges:  OT General Charges $OT Visit: 1 Procedure OT Evaluation $OT Eval Low Complexity: 1 Procedure OT Treatments $Self Care/Home Management : 23-37 mins G-Codes:     Jeni Salles, MPH, MS, OTR/L ascom [phone] 04/02/16, 2:07 PM

## 2016-04-03 LAB — CBC
HEMATOCRIT: 30.1 % — AB (ref 35.0–47.0)
Hemoglobin: 10.4 g/dL — ABNORMAL LOW (ref 12.0–16.0)
MCH: 30.7 pg (ref 26.0–34.0)
MCHC: 34.4 g/dL (ref 32.0–36.0)
MCV: 89.2 fL (ref 80.0–100.0)
Platelets: 156 10*3/uL (ref 150–440)
RBC: 3.37 MIL/uL — ABNORMAL LOW (ref 3.80–5.20)
RDW: 14.7 % — ABNORMAL HIGH (ref 11.5–14.5)
WBC: 8.3 10*3/uL (ref 3.6–11.0)

## 2016-04-03 LAB — BASIC METABOLIC PANEL
ANION GAP: 7 (ref 5–15)
BUN: 28 mg/dL — ABNORMAL HIGH (ref 6–20)
CHLORIDE: 108 mmol/L (ref 101–111)
CO2: 25 mmol/L (ref 22–32)
Calcium: 7.8 mg/dL — ABNORMAL LOW (ref 8.9–10.3)
Creatinine, Ser: 1.6 mg/dL — ABNORMAL HIGH (ref 0.44–1.00)
GFR calc non Af Amer: 28 mL/min — ABNORMAL LOW (ref >60)
GFR, EST AFRICAN AMERICAN: 33 mL/min — AB (ref >60)
GLUCOSE: 83 mg/dL (ref 65–99)
POTASSIUM: 3 mmol/L — AB (ref 3.5–5.1)
Sodium: 140 mmol/L (ref 135–145)

## 2016-04-03 LAB — GLUCOSE, CAPILLARY
GLUCOSE-CAPILLARY: 100 mg/dL — AB (ref 65–99)
Glucose-Capillary: 69 mg/dL (ref 65–99)

## 2016-04-03 MED ORDER — TRAMADOL HCL 50 MG PO TABS: 50.0000 mg | ORAL_TABLET | Freq: Four times a day (QID) | ORAL | 1 refills | Status: DC | PRN

## 2016-04-03 MED ORDER — POTASSIUM CHLORIDE CRYS ER 20 MEQ PO TBCR: 20.0000 meq | EXTENDED_RELEASE_TABLET | Freq: Two times a day (BID) | ORAL | 0 refills | Status: DC

## 2016-04-03 MED ORDER — POTASSIUM CHLORIDE CRYS ER 20 MEQ PO TBCR
20.0000 meq | EXTENDED_RELEASE_TABLET | Freq: Two times a day (BID) | ORAL | Status: DC
Start: 2016-04-03 — End: 2016-04-03
  Administered 2016-04-03: 20 meq via ORAL
  Filled 2016-04-03: qty 1

## 2016-04-03 MED ORDER — OXYCODONE HCL 5 MG PO TABS: 5.0000 mg | ORAL_TABLET | ORAL | 0 refills | Status: DC | PRN

## 2016-04-03 MED ORDER — ENOXAPARIN SODIUM 40 MG/0.4ML ~~LOC~~ SOLN: 40.0000 mg | SUBCUTANEOUS | 0 refills | Status: DC

## 2016-04-03 MED ORDER — CELECOXIB 200 MG PO CAPS: 200.0000 mg | ORAL_CAPSULE | Freq: Two times a day (BID) | ORAL | 1 refills | Status: DC

## 2016-04-03 NOTE — Care Management Note (Signed)
Case Management Note  Patient Details  Name: Deanna Bird MRN: [id number] Date of Birth: 1928/07/10  Subjective/Objective:  Discharging today                  Action/Plan: [facility] notified of discharge. Cost of Lovenox is $ 85.00. Patient updated and is in agreement with POC.   Expected Discharge Date:  04/03/16               Expected Discharge Plan:  [place]  In-House Referral:     Discharge planning Services  CM Consult  Post Acute Care Choice:  Home Health Choice offered to:  Patient, Adult Children  DME Arranged:    DME Agency:     HH Arranged:  PT [place]:  [facility] ([facility])  Status of Service:  Completed, signed off  If discussed at [employer] of Stay Meetings, dates discussed:    Additional Comments:  [name], RN 04/03/2016, 8:48 AM

## 2016-04-03 NOTE — Progress Notes (Signed)
DISCHARGE NOTE:  Pt given discharge instructions and prescriptions. (Oxycodone & Tramadol). Pt verbalized understanding. Pts dressing changed, TEDS hose on, Pt wheeled to car by staff.

## 2016-04-03 NOTE — Progress Notes (Signed)
qPhysical Therapy Treatment Patient Details Name: Deanna Bird MRN: [id number] DOB: June 11, 1928 Today's Date: 04/03/2016    History of Present Illness Pt is a 83 y/o F s/p R TKA.      PT Comments     Deanna Bird made excellent progress today and completed stair training with min guard assist. Emphasized to pt that she must have her daughter with her when ascending/descending steps at home.  She ambulated 150 ft with RW with min guard assist for safety.  She performs sit<>stand transfers with safe technique without cues.  Discussed car transfer technique.  Pt denies having any questions at this time.   Follow Up Recommendations  Home health PT     Equipment Recommendations  None recommended by PT    Recommendations for Other Services       Precautions / Restrictions Precautions Precautions: Fall;Knee Precaution Booklet Issued: No Precaution Comments: Reviewed no pillow under knee Required Braces or Orthoses: Knee Immobilizer - Right Knee Immobilizer - Right:  (if unable to perform SLR) Restrictions Weight Bearing Restrictions: Yes RLE Weight Bearing: Weight bearing as tolerated    Mobility  Bed Mobility               General bed mobility comments: Pt sitting in chair upon PT arrival  Transfers Overall transfer level: Needs assistance Equipment used: Rolling walker (2 wheeled) Transfers: Sit to/from Stand Sit to Stand: Supervision         General transfer comment: Pt performs with correct hand placement and safe technique.  Supervision for safety  Ambulation/Gait Ambulation/Gait assistance: Min guard Ambulation Distance (Feet): 150 Feet Assistive device: Rolling walker (2 wheeled) Gait Pattern/deviations: Decreased stride length;Decreased stance time - right;Decreased step length - left;Decreased weight shift to right;Antalgic;Trunk flexed;Step-through pattern Gait velocity: decreased Gait velocity interpretation: Below normal speed for age/gender General  Gait Details: Pt performs step through gait pattern with cues to keep RW moving as she ambulates.  She demonstrated forward gaze without cues today.  Min guard for safety.   Stairs Stairs: Yes   Stair Management: One rail Left;Step to pattern;Forwards Number of Stairs: 6 General stair comments: Demonstrated technique to pt before pt attempted.  Step to pattern with L rail.  Min guard for safety.  Emphasized that she must have her daughter with her when ascending/descending steps at home.  Wheelchair Mobility    Modified Rankin (Stroke Patients Only)       Balance Overall balance assessment: Needs assistance;History of Falls Sitting-balance support: No upper extremity supported;Feet supported Sitting balance-Leahy Scale: Good     Standing balance support: During functional activity;No upper extremity supported Standing balance-Leahy Scale: Fair Standing balance comment: Pt able to stand statically without UE support but requires RW for dynamic activities                            Cognition Arousal/Alertness: Awake/alert Behavior During Therapy: WFL for tasks assessed/performed Overall Cognitive Status: Within Functional Limits for tasks assessed                                        Exercises Total Joint Exercises Ankle Circles/Pumps: AROM;Both;10 reps;Seated Quad Sets: Strengthening;Both;10 reps;Other (comment);Seated (with 5 second holds) Hip ABduction/ADduction: Strengthening;Right;10 reps;Seated Straight Leg Raises: Strengthening;Right;10 reps;Seated Long Arc Quad: Strengthening;Right;10 reps;Seated Knee Flexion: AAROM;Right;5 reps;Seated;Other (comment) (with 5 second holds) Goniometric ROM: 94 deg  R knee flexion with AAROM exercise Other Exercises Other Exercises: Lateral weight shifting in standing prior to ambulating. x10 each LE    General Comments General comments (skin integrity, edema, etc.): Reviewed technique for car transfer  and encouraged pt to have her daughter place any throw rugs away in the closet.      Pertinent Vitals/Pain Pain Assessment: 0-10 Pain Score: 9  Pain Location: R knee Pain Descriptors / Indicators: Aching;Guarding Pain Intervention(s): Limited activity within patient's tolerance;Monitored during session;Premedicated before session;Repositioned;Other (comment) (Polar care in place at end of session)    Home Living                      Prior Function            PT Goals (current goals can now be found in the care plan section) Acute Rehab PT Goals Patient Stated Goal: to go home PT Goal Formulation: With patient Time For Goal Achievement: 04/16/16 Potential to Achieve Goals: Good Progress towards PT goals: Progressing toward goals    Frequency    BID      PT Plan Current plan remains appropriate    Co-evaluation             End of Session Equipment Utilized During Treatment: Gait belt Activity Tolerance: Patient tolerated treatment well Patient left: with call bell/phone within reach;Other (comment);in chair;with chair alarm set (with polar care and bone foam in place) Nurse Communication: Mobility status PT Visit Diagnosis: Pain;Unsteadiness on feet (R26.81) Pain - Right/Left: Right Pain - part of body: Knee     Time: 4917-9150 PT Time Calculation (min) (ACUTE ONLY): 40 min  Charges:  $Gait Training: 23-37 mins $Therapeutic Exercise: 8-22 mins                    G Codes:       Deanna Bird PT, DPT   Deanna Bird 04/03/2016, 9:57 AM

## 2016-04-03 NOTE — Progress Notes (Addendum)
  Subjective: 2 Days Post-Op Procedure(s) (LRB): COMPUTER ASSISTED TOTAL KNEE ARTHROPLASTY (Right) Patient reports pain as mild.   Patient seen in rounds with Dr. Marry Guan. Patient is well, and has had no acute complaints or problems Plan is to go Home after hospital stay. Negative for chest pain and shortness of breath Fever: no Gastrointestinal: Negative for nausea and vomiting, but had vomiting yesterday. She is much better today.  Objective: Vital signs in last 24 hours: Temp:  [97.7 F (36.5 C)-98.3 F (36.8 C)] 98.1 F (36.7 C) (03/30 0348) Pulse Rate:  [59-70] 70 (03/30 0348) Resp:  [16-18] 18 (03/30 0348) BP: (110-130)/(42-55) 128/42 (03/30 0348) SpO2:  [98 %-100 %] 98 % (03/30 0348)  Intake/Output from previous day:  Intake/Output Summary (Last 24 hours) at 04/03/16 0648 Last data filed at 04/03/16 0348  Gross per 24 hour  Intake                0 ml  Output              520 ml  Net             -520 ml    Intake/Output this shift: Total I/O In: -  Out: 120 [Drains:120]  Labs:  Recent Labs  04/02/16 0446 04/03/16 0429  HGB 10.2* 10.4*    Recent Labs  04/02/16 0446 04/03/16 0429  WBC 9.2 8.3  RBC 3.36* 3.37*  HCT 29.9* 30.1*  PLT 153 156    Recent Labs  04/02/16 0446 04/03/16 0429  NA 141 140  K 3.7 3.0*  CL 110 108  CO2 27 25  BUN 24* 28*  CREATININE 1.33* 1.60*  GLUCOSE 118* 83  CALCIUM 7.5* 7.8*   No results for input(s): LABPT, INR in the last 72 hours.   EXAM General - Patient is Alert and Oriented Extremity - Sensation intact distally Dorsiflexion/Plantar flexion intact No cellulitis present Compartment soft Dressing/Incision - clean, dry, scant drainage. The Hemovac was removed and the surgical bandage was removed. Motor Function - intact, moving foot and toes well on exam. She ambulated around the nurse's station.  Past Medical History:  Diagnosis Date  . Achalasia   . Barrett's esophagus with dysplasia   . Diabetes  mellitus without complication ([place])   . GERD (gastroesophageal reflux disease)   . Hyperlipidemia   . Hypertension   . MRSA infection    lung  . Thyroid disease     Assessment/Plan: 2 Days Post-Op Procedure(s) (LRB): COMPUTER ASSISTED TOTAL KNEE ARTHROPLASTY (Right) Active Problems:   S/P total knee arthroplasty  Estimated body mass index is 26.57 kg/m as calculated from the following:   Height as of this encounter: 5\' 3"  (1.6 m).   Weight as of this encounter: 68 kg (150 lb). Up with therapy Discharge home with home health  Hypokalemia after vomiting yesterday. Add potassium orally to go home.  DVT Prophylaxis - Lovenox, Foot Pumps and TED hose Weight-Bearing as tolerated to right leg  Reche Dixon, PA-C Orthopaedic Surgery 04/03/2016, 6:48 AM

## 2016-04-03 NOTE — Discharge Instructions (Signed)

## 2016-04-03 NOTE — Progress Notes (Signed)
Pt remaining alert and oriented this morning. No complaints of pain  this morning. Declining a pain pill at this time. Dressing remaining dry and intact. Up to bathroom with assistance. Voiding without difficulty and already had post op bowel movement.

## 2016-04-03 NOTE — Discharge Summary (Signed)
Physician Discharge Summary  Subjective: 2 Days Post-Op Procedure(s) (LRB): COMPUTER ASSISTED TOTAL KNEE ARTHROPLASTY (Right) Patient reports pain as mild.   Patient seen in rounds with Dr. Marry Guan. Patient is well, and has had no acute complaints or problems Patient is ready to go home with home health physical therapy  Physician Discharge Summary  Patient ID: Deanna Bird MRN: [id number] DOB/AGE: December 24, 1928 83 y.o.  Admit date: 04/01/2016 Discharge date: 04/03/2016  Admission Diagnoses:  Discharge Diagnoses:  Active Problems:   S/P total knee arthroplasty   Discharged Condition: good  Hospital Course: The patient is postop day 2 from a right total knee replacement done by Dr. Marry Guan. She has done very well since surgery. The patient had a bowel movement on postop day 1 and did have some vomiting. Her potassium level dropped from 3.8-3.0 on postop day 2. Potassium oral was added for the next week. The patient ambulated around the nurse's station with about 220 feet walk. She is able to independently move in the room. She is ready to go home on postop day 2.  Treatments: surgery:  Right total knee arthroplasty using computer-assisted navigation  SURGEON:  Marciano Sequin. M.D.  ASSISTANT:  Vance Peper, PA (present and scrubbed throughout the case, critical for assistance with exposure, retraction, instrumentation, and closure)  ANESTHESIA: spinal  ESTIMATED BLOOD LOSS: 50 mL  FLUIDS REPLACED: 1700 mL of crystalloid  TOURNIQUET TIME: 119 minutes  DRAINS: 2 medium drains to a reinfusion system  SOFT TISSUE RELEASES: Anterior cruciate ligament, posterior cruciate ligament, deep medial collateral ligament, patellofemoral ligament, posterolateral corner, and pie crusting of the IT band  IMPLANTS UTILIZED: DePuy Attune size 3 posterior stabilized femoral component (cemented), size 4 rotating platform tibial component (cemented), 32 mm medialized dome patella  (cemented), and a 5 mm stabilized rotating platform polyethylene insert.  Discharge Exam: Blood pressure (!) 128/42, pulse 70, temperature 98.1 F (36.7 C), temperature source Oral, resp. rate 18, height 5\' 3"  (1.6 m), weight 68 kg (150 lb), SpO2 98 %.   Disposition: 01-Home or Self Care   Allergies as of 04/03/2016      Reactions   Procaine Palpitations   Rapid heartbeat      Medication List    TAKE these medications   Alpha-Lipoic Acid 200 MG Tabs Take 200 mg by mouth 2 (two) times daily.   celecoxib 200 MG capsule Commonly known as:  CELEBREX Take 1 capsule (200 mg total) by mouth every 12 (twelve) hours.   CHLORELLA PO Take 7-8 tablets by mouth 2 (two) times daily. 8 TABLETS IN THE MORNING WITH BREAKFAST & 7 TABLET IN THE EVENING WITH SUPPER   COENZYME Q10-OMEGA 3 FATTY ACD PO Take 1 capsule by mouth 2 (two) times daily.   docusate sodium 100 MG capsule Commonly known as:  COLACE Take 200 mg by mouth at bedtime.   DUCODYL 5 MG EC tablet Generic drug:  bisacodyl Take 5 mg by mouth at bedtime.   enoxaparin 40 MG/0.4ML injection Commonly known as:  LOVENOX Inject 0.4 mLs (40 mg total) into the skin daily.   EYE VITAMINS PO Take 2 capsules by mouth 2 (two) times daily. VISION ESSENTIALS GOLD   Fenofibric Acid 105 MG Tabs Take 1 tablet (105 mg total) by mouth daily. What changed:  when to take this   glimepiride 1 MG tablet Commonly known as:  AMARYL Take 2 tablets (2 mg total) by mouth daily with breakfast.   hydrocortisone 2.5 % rectal cream Commonly  known as:  PROCTOZONE-HC Place 1 application rectally 2 (two) times daily. What changed:  when to take this  reasons to take this   losartan-hydrochlorothiazide 100-25 MG tablet Commonly known as:  HYZAAR Take 1 tablet by mouth daily. What changed:  when to take this   MELATONIN PO Take 0.9 mg by mouth at bedtime. SLEEP ANSWER   metoprolol succinate 100 MG 24 hr tablet Commonly known as:   TOPROL-XL Take 1 tablet (100 mg total) by mouth daily. Take with or immediately following a meal. What changed:  when to take this  additional instructions   MULTIVITAMINS PO Take 1 packet by mouth 2 (two) times daily. Forward Gold Daily Regimen (multivitamin/bone&balance/omega 3) By Dr. Lanell Matar   mupirocin cream 2 % Commonly known as:  BACTROBAN Apply 1 application topically 2 (two) times daily. What changed:  when to take this  reasons to take this   NON FORMULARY Take 2 capsules by mouth daily with supper. Advanced Lung & Bronchial Support (Bromelain, N-Acetyl L-Cysteine, Quercetin, and a variety of traditional herbs-Astragalus, Marshmallow, and [place])   NONFORMULARY OR COMPOUNDED ITEM Take 2 capsules by mouth at bedtime. Lung and bronchial support   OVER THE COUNTER MEDICATION Take 1 capsule by mouth daily with supper. OMEGA Q PLUS 100 (RESVERATROL)   oxyCODONE 5 MG immediate release tablet Commonly known as:  Oxy IR/ROXICODONE Take 1-2 tablets (5-10 mg total) by mouth every 4 (four) hours as needed for severe pain or breakthrough pain.   potassium chloride SA 20 MEQ tablet Commonly known as:  K-DUR,KLOR-CON Take 1 tablet (20 mEq total) by mouth 2 (two) times daily.   PROBIOTIC PO Take 1 capsule by mouth daily before breakfast.   traMADol 50 MG tablet Commonly known as:  ULTRAM Take 1-2 tablets (50-100 mg total) by mouth every 6 (six) hours as needed for moderate pain.   Vitamin D-3 5000 units Tabs Take by mouth daily with supper.            Durable Medical Equipment        Start     Ordered   04/01/16 1727  DME Walker rolling  Once    Question:  Patient needs a walker to treat with the following condition  Answer:  Total knee replacement status   04/01/16 1727   04/01/16 1727  DME Bedside commode  Once    Question:  Patient needs a bedside commode to treat with the following condition  Answer:  Total knee replacement status   04/01/16 1727      Follow-up Information    WOLFE,JON R., PA On 04/16/2016.   Specialty:  Physician Assistant Why:  at 10:45am Contact information: [place] [address] [phone]        Dereck Leep, MD On 05/14/2016.   Specialty:  Orthopedic Surgery Why:  at 11:15am Contact information: [address] [facility] [place] [address] [phone]           Signed: Prescott Parma, TODD 04/03/2016, 6:55 AM   Objective: Vital signs in last 24 hours: Temp:  [97.7 F (36.5 C)-98.3 F (36.8 C)] 98.1 F (36.7 C) (03/30 0348) Pulse Rate:  [59-70] 70 (03/30 0348) Resp:  [16-18] 18 (03/30 0348) BP: (110-130)/(42-55) 128/42 (03/30 0348) SpO2:  [98 %-100 %] 98 % (03/30 0348)  Intake/Output from previous day:  Intake/Output Summary (Last 24 hours) at 04/03/16 0655 Last data filed at 04/03/16 0348  Gross per 24 hour  Intake                0 ml  Output              520 ml  Net             -520 ml    Intake/Output this shift: Total I/O In: -  Out: 120 [Drains:120]  Labs:  Recent Labs  04/02/16 0446 04/03/16 0429  HGB 10.2* 10.4*    Recent Labs  04/02/16 0446 04/03/16 0429  WBC 9.2 8.3  RBC 3.36* 3.37*  HCT 29.9* 30.1*  PLT 153 156    Recent Labs  04/02/16 0446 04/03/16 0429  NA 141 140  K 3.7 3.0*  CL 110 108  CO2 27 25  BUN 24* 28*  CREATININE 1.33* 1.60*  GLUCOSE 118* 83  CALCIUM 7.5* 7.8*   No results for input(s): LABPT, INR in the last 72 hours.  EXAM: General - Patient is Alert and Oriented Extremity - Sensation intact distally Dorsiflexion/Plantar flexion intact No cellulitis present Compartment soft Incision - clean, dry, scant drainage Motor Function -  the patient is able to plantarflex and dorsiflex her feet. She can do a straight leg raise and independently ambulate.  Assessment/Plan: 2 Days Post-Op Procedure(s) (LRB): COMPUTER ASSISTED TOTAL KNEE ARTHROPLASTY  (Right) Procedure(s) (LRB): COMPUTER ASSISTED TOTAL KNEE ARTHROPLASTY (Right) Past Medical History:  Diagnosis Date  . Achalasia   . Barrett's esophagus with dysplasia   . Diabetes mellitus without complication ([place])   . GERD (gastroesophageal reflux disease)   . Hyperlipidemia   . Hypertension   . MRSA infection    lung  . Thyroid disease    Active Problems:   S/P total knee arthroplasty  Estimated body mass index is 26.57 kg/m as calculated from the following:   Height as of this encounter: 5\' 3"  (1.6 m).   Weight as of this encounter: 68 kg (150 lb). Up with therapy Discharge home with home health Diet - Regular diet Follow up - in 2 weeks Activity - WBAT Disposition - Home Condition Upon Discharge - Good DVT Prophylaxis - Lovenox and TED hose  Reche Dixon, PA-C Orthopaedic Surgery 04/03/2016, 6:55 AM

## 2016-04-05 DIAGNOSIS — E785 Hyperlipidemia, unspecified: Secondary | ICD-10-CM | POA: Diagnosis not present

## 2016-04-05 DIAGNOSIS — Z96651 Presence of right artificial knee joint: Secondary | ICD-10-CM | POA: Diagnosis not present

## 2016-04-05 DIAGNOSIS — Z7901 Long term (current) use of anticoagulants: Secondary | ICD-10-CM | POA: Diagnosis not present

## 2016-04-05 DIAGNOSIS — E119 Type 2 diabetes mellitus without complications: Secondary | ICD-10-CM | POA: Diagnosis not present

## 2016-04-05 DIAGNOSIS — I10 Essential (primary) hypertension: Secondary | ICD-10-CM | POA: Diagnosis not present

## 2016-04-05 DIAGNOSIS — Z79891 Long term (current) use of opiate analgesic: Secondary | ICD-10-CM | POA: Diagnosis not present

## 2016-04-05 DIAGNOSIS — K22719 Barrett's esophagus with dysplasia, unspecified: Secondary | ICD-10-CM | POA: Diagnosis not present

## 2016-04-05 DIAGNOSIS — Z7984 Long term (current) use of oral hypoglycemic drugs: Secondary | ICD-10-CM | POA: Diagnosis not present

## 2016-04-05 DIAGNOSIS — Z471 Aftercare following joint replacement surgery: Secondary | ICD-10-CM | POA: Diagnosis not present

## 2016-04-05 DIAGNOSIS — K220 Achalasia of cardia: Secondary | ICD-10-CM | POA: Diagnosis not present

## 2016-04-13 DIAGNOSIS — I10 Essential (primary) hypertension: Secondary | ICD-10-CM | POA: Diagnosis not present

## 2016-04-13 DIAGNOSIS — E119 Type 2 diabetes mellitus without complications: Secondary | ICD-10-CM | POA: Diagnosis not present

## 2016-04-13 DIAGNOSIS — Z79891 Long term (current) use of opiate analgesic: Secondary | ICD-10-CM | POA: Diagnosis not present

## 2016-04-13 DIAGNOSIS — Z96651 Presence of right artificial knee joint: Secondary | ICD-10-CM | POA: Diagnosis not present

## 2016-04-13 DIAGNOSIS — K22719 Barrett's esophagus with dysplasia, unspecified: Secondary | ICD-10-CM | POA: Diagnosis not present

## 2016-04-13 DIAGNOSIS — K220 Achalasia of cardia: Secondary | ICD-10-CM | POA: Diagnosis not present

## 2016-04-13 DIAGNOSIS — Z7901 Long term (current) use of anticoagulants: Secondary | ICD-10-CM | POA: Diagnosis not present

## 2016-04-13 DIAGNOSIS — Z7984 Long term (current) use of oral hypoglycemic drugs: Secondary | ICD-10-CM | POA: Diagnosis not present

## 2016-04-13 DIAGNOSIS — Z471 Aftercare following joint replacement surgery: Secondary | ICD-10-CM | POA: Diagnosis not present

## 2016-04-13 DIAGNOSIS — E785 Hyperlipidemia, unspecified: Secondary | ICD-10-CM | POA: Diagnosis not present

## 2016-04-16 ENCOUNTER — Telehealth: Payer: Self-pay | Admitting: Family Medicine

## 2016-04-16 DIAGNOSIS — M25561 Pain in right knee: Secondary | ICD-10-CM | POA: Diagnosis not present

## 2016-04-16 NOTE — Telephone Encounter (Signed)
I think that is reasonable, looks like they did an A1C as recently as 3/16 and it was 5.5. Let's shoot for 3 months from this reading.

## 2016-04-16 NOTE — Telephone Encounter (Signed)
Patient recently had knee replacement and is wondering if Dr Jeananne Rama would be okay with her putting off her appt for a couple of months.  She says she is doing well.  Please advise.  Thanks  Appt 5/2

## 2016-04-16 NOTE — Telephone Encounter (Signed)
Pt to return for Complete physical and diabetes f/u/. A1C at last visit was 5.8%. Please advise.

## 2016-04-17 NOTE — Telephone Encounter (Signed)
Pt aware. 05/06/16 appt cancelled.

## 2016-04-20 DIAGNOSIS — M25561 Pain in right knee: Secondary | ICD-10-CM | POA: Diagnosis not present

## 2016-04-23 DIAGNOSIS — M25561 Pain in right knee: Secondary | ICD-10-CM | POA: Diagnosis not present

## 2016-04-27 DIAGNOSIS — M25561 Pain in right knee: Secondary | ICD-10-CM | POA: Diagnosis not present

## 2016-05-04 DIAGNOSIS — M25561 Pain in right knee: Secondary | ICD-10-CM | POA: Diagnosis not present

## 2016-05-06 ENCOUNTER — Encounter: Payer: PPO | Admitting: Family Medicine

## 2016-05-07 DIAGNOSIS — M25561 Pain in right knee: Secondary | ICD-10-CM | POA: Diagnosis not present

## 2016-05-12 DIAGNOSIS — E119 Type 2 diabetes mellitus without complications: Secondary | ICD-10-CM | POA: Diagnosis not present

## 2016-05-12 DIAGNOSIS — H35372 Puckering of macula, left eye: Secondary | ICD-10-CM | POA: Diagnosis not present

## 2016-05-12 DIAGNOSIS — H2513 Age-related nuclear cataract, bilateral: Secondary | ICD-10-CM | POA: Diagnosis not present

## 2016-05-12 LAB — HM DIABETES EYE EXAM

## 2016-05-14 DIAGNOSIS — Z96651 Presence of right artificial knee joint: Secondary | ICD-10-CM | POA: Diagnosis not present

## 2016-05-14 DIAGNOSIS — M25561 Pain in right knee: Secondary | ICD-10-CM | POA: Diagnosis not present

## 2016-05-14 DIAGNOSIS — E119 Type 2 diabetes mellitus without complications: Secondary | ICD-10-CM | POA: Diagnosis not present

## 2016-06-10 ENCOUNTER — Other Ambulatory Visit: Payer: Self-pay | Admitting: Family Medicine

## 2016-06-10 DIAGNOSIS — I10 Essential (primary) hypertension: Secondary | ICD-10-CM

## 2016-06-10 NOTE — Telephone Encounter (Signed)
apt 

## 2016-06-10 NOTE — Telephone Encounter (Signed)
Pt needs scheduled for AWV.

## 2016-06-10 NOTE — Telephone Encounter (Signed)
Last OV: 02/05/16 Next OV: None on file.   BMP Latest Ref Rng & Units 04/03/2016 04/02/2016 03/20/2016  Glucose 65 - 99 mg/dL 83 118(H) 51(L)  BUN 6 - 20 mg/dL 28(H) 24(H) 30(H)  Creatinine 0.44 - 1.00 mg/dL 1.60(H) 1.33(H) 1.13(H)  BUN/Creat Ratio 12 - 28 - - -  Sodium 135 - 145 mmol/L 140 141 140  Potassium 3.5 - 5.1 mmol/L 3.0(L) 3.7 3.6  Chloride 101 - 111 mmol/L 108 110 102  CO2 22 - 32 mmol/L 25 27 32  Calcium 8.9 - 10.3 mg/dL 7.8(L) 7.5(L) 9.6

## 2016-06-17 DIAGNOSIS — M545 Low back pain: Secondary | ICD-10-CM | POA: Diagnosis not present

## 2016-06-17 DIAGNOSIS — M549 Dorsalgia, unspecified: Secondary | ICD-10-CM | POA: Diagnosis not present

## 2016-06-24 DIAGNOSIS — Z471 Aftercare following joint replacement surgery: Secondary | ICD-10-CM | POA: Diagnosis not present

## 2016-06-25 DIAGNOSIS — M545 Low back pain: Secondary | ICD-10-CM | POA: Diagnosis not present

## 2016-06-25 DIAGNOSIS — M6281 Muscle weakness (generalized): Secondary | ICD-10-CM | POA: Diagnosis not present

## 2016-06-25 DIAGNOSIS — M79605 Pain in left leg: Secondary | ICD-10-CM | POA: Diagnosis not present

## 2016-06-30 DIAGNOSIS — M545 Low back pain: Secondary | ICD-10-CM | POA: Diagnosis not present

## 2016-07-03 DIAGNOSIS — M545 Low back pain: Secondary | ICD-10-CM | POA: Diagnosis not present

## 2016-07-15 ENCOUNTER — Ambulatory Visit (INDEPENDENT_AMBULATORY_CARE_PROVIDER_SITE_OTHER): Payer: PPO

## 2016-07-15 VITALS — BP 132/60 | HR 66 | Temp 98.3°F | Resp 16 | Ht 62.0 in | Wt 144.4 lb

## 2016-07-15 DIAGNOSIS — Z0000 Encounter for general adult medical examination without abnormal findings: Secondary | ICD-10-CM | POA: Diagnosis not present

## 2016-07-15 NOTE — Patient Instructions (Signed)
Deanna Bird , Thank you for taking time to come for your Medicare Wellness Visit. I appreciate your ongoing commitment to your health goals. Please review the following plan we discussed and let me know if I can assist you in the future.   Screening recommendations/referrals: Colonoscopy: no longer required Mammogram: no longer required Bone Density:completed 07/05/2013 Recommended yearly ophthalmology/optometry visit for glaucoma screening and checkup Recommended yearly dental visit for hygiene and checkup  Vaccinations: Influenza vaccine: up to date, due 10/2016 Pneumococcal vaccine: up to date Tdap vaccine: due, check with your insurance company for coverage Shingles vaccine: up to date   Advanced directives: Copy on file, no changes requested today  Conditions/risks identified: Recommend drinking at least 4-5 glasses of water a day  Next appointment: Follow up in one year for your annual wellness exam in one year.    Preventive Care 81 Years and Older, Female Preventive care refers to lifestyle choices and visits with your health care provider that can promote health and wellness. What does preventive care include?  A yearly physical exam. This is also called an annual well check.  Dental exams once or twice a year.  Routine eye exams. Ask your health care provider how often you should have your eyes checked.  Personal lifestyle choices, including:  Daily care of your teeth and gums.  Regular physical activity.  Eating a healthy diet.  Avoiding tobacco and drug use.  Limiting alcohol use.  Practicing safe sex.  Taking low-dose aspirin every day.  Taking vitamin and mineral supplements as recommended by your health care provider. What happens during an annual well check? The services and screenings done by your health care provider during your annual well check will depend on your age, overall health, lifestyle risk factors, and family history of disease. Counseling   Your health care provider may ask you questions about your:  Alcohol use.  Tobacco use.  Drug use.  Emotional well-being.  Home and relationship well-being.  Sexual activity.  Eating habits.  History of falls.  Memory and ability to understand (cognition).  Work and work Statistician.  Reproductive health. Screening  You may have the following tests or measurements:  Height, weight, and BMI.  Blood pressure.  Lipid and cholesterol levels. These may be checked every 5 years, or more frequently if you are over 49 years old.  Skin check.  Lung cancer screening. You may have this screening every year starting at age 26 if you have a 30-pack-year history of smoking and currently smoke or have quit within the past 15 years.  Fecal occult blood test (FOBT) of the stool. You may have this test every year starting at age 53.  Flexible sigmoidoscopy or colonoscopy. You may have a sigmoidoscopy every 5 years or a colonoscopy every 10 years starting at age 31.  Hepatitis C blood test.  Hepatitis B blood test.  Sexually transmitted disease (STD) testing.  Diabetes screening. This is done by checking your blood sugar (glucose) after you have not eaten for a while (fasting). You may have this done every 1-3 years.  Bone density scan. This is done to screen for osteoporosis. You may have this done starting at age 3.  Mammogram. This may be done every 1-2 years. Talk to your health care provider about how often you should have regular mammograms. Talk with your health care provider about your test results, treatment options, and if necessary, the need for more tests. Vaccines  Your health care provider may recommend certain  vaccines, such as:  Influenza vaccine. This is recommended every year.  Tetanus, diphtheria, and acellular pertussis (Tdap, Td) vaccine. You may need a Td booster every 10 years.  Zoster vaccine. You may need this after age 72.  Pneumococcal 13-valent  conjugate (PCV13) vaccine. One dose is recommended after age 58.  Pneumococcal polysaccharide (PPSV23) vaccine. One dose is recommended after age 74. Talk to your health care provider about which screenings and vaccines you need and how often you need them. This information is not intended to replace advice given to you by your health care provider. Make sure you discuss any questions you have with your health care provider. Document Released: 01/18/2015 Document Revised: 09/11/2015 Document Reviewed: 10/23/2014 Elsevier Interactive Patient Education  2017 [place] Prevention in the Home Falls can cause injuries. They can happen to people of all ages. There are many things you can do to make your home safe and to help prevent falls. What can I do on the outside of my home?  Regularly fix the edges of walkways and driveways and fix any cracks.  Remove anything that might make you trip as you walk through a door, such as a raised step or threshold.  Trim any bushes or trees on the path to your home.  Use bright outdoor lighting.  Clear any walking paths of anything that might make someone trip, such as rocks or tools.  Regularly check to see if handrails are loose or broken. Make sure that both sides of any steps have handrails.  Any raised decks and porches should have guardrails on the edges.  Have any leaves, snow, or ice cleared regularly.  Use sand or salt on walking paths during winter.  Clean up any spills in your garage right away. This includes oil or grease spills. What can I do in the bathroom?  Use night lights.  Install grab bars by the toilet and in the tub and shower. Do not use towel bars as grab bars.  Use non-skid mats or decals in the tub or shower.  If you need to sit down in the shower, use a plastic, non-slip stool.  Keep the floor dry. Clean up any water that spills on the floor as soon as it happens.  Remove soap buildup in the tub or  shower regularly.  Attach bath mats securely with double-sided non-slip rug tape.  Do not have throw rugs and other things on the floor that can make you trip. What can I do in the bedroom?  Use night lights.  Make sure that you have a light by your bed that is easy to reach.  Do not use any sheets or blankets that are too big for your bed. They should not hang down onto the floor.  Have a firm chair that has side arms. You can use this for support while you get dressed.  Do not have throw rugs and other things on the floor that can make you trip. What can I do in the kitchen?  Clean up any spills right away.  Avoid walking on wet floors.  Keep items that you use a lot in easy-to-reach places.  If you need to reach something above you, use a strong step stool that has a grab bar.  Keep electrical cords out of the way.  Do not use floor polish or wax that makes floors slippery. If you must use wax, use non-skid floor wax.  Do not have throw rugs and other things  on the floor that can make you trip. What can I do with my stairs?  Do not leave any items on the stairs.  Make sure that there are handrails on both sides of the stairs and use them. Fix handrails that are broken or loose. Make sure that handrails are as long as the stairways.  Check any carpeting to make sure that it is firmly attached to the stairs. Fix any carpet that is loose or worn.  Avoid having throw rugs at the top or bottom of the stairs. If you do have throw rugs, attach them to the floor with carpet tape.  Make sure that you have a light switch at the top of the stairs and the bottom of the stairs. If you do not have them, ask someone to add them for you. What else can I do to help prevent falls?  Wear shoes that:  Do not have high heels.  Have rubber bottoms.  Are comfortable and fit you well.  Are closed at the toe. Do not wear sandals.  If you use a stepladder:  Make sure that it is fully  opened. Do not climb a closed stepladder.  Make sure that both sides of the stepladder are locked into place.  Ask someone to hold it for you, if possible.  Clearly mark and make sure that you can see:  Any grab bars or handrails.  First and last steps.  Where the edge of each step is.  Use tools that help you move around (mobility aids) if they are needed. These include:  Canes.  Walkers.  Scooters.  Crutches.  Turn on the lights when you go into a dark area. Replace any light bulbs as soon as they burn out.  Set up your furniture so you have a clear path. Avoid moving your furniture around.  If any of your floors are uneven, fix them.  If there are any pets around you, be aware of where they are.  Review your medicines with your doctor. Some medicines can make you feel dizzy. This can increase your chance of falling. Ask your doctor what other things that you can do to help prevent falls. This information is not intended to replace advice given to you by your health care provider. Make sure you discuss any questions you have with your health care provider. Document Released: 10/18/2008 Document Revised: 05/30/2015 Document Reviewed: 01/26/2014 Elsevier Interactive Patient Education  2017 [employer].

## 2016-07-15 NOTE — Progress Notes (Signed)
Subjective:   Deanna Bird is a 83 y.o. female who presents for Medicare Annual (Subsequent) preventive examination.  Review of Systems:   Cardiac Risk Factors include: hypertension;dyslipidemia;diabetes mellitus;advanced age (>60men, >4 women)     Objective:     Vitals: BP 132/60 (BP Location: Left Arm, Patient Position: Sitting)   Pulse 66   Temp 98.3 F (36.8 C)   Resp 16   Ht 5\' 2"  (1.575 m)   Wt 144 lb 6.4 oz (65.5 kg)   BMI 26.41 kg/m   Body mass index is 26.41 kg/m.   Tobacco History  Smoking Status  . Never Smoker  Smokeless Tobacco  . Never Used     Counseling given: Not Answered   Past Medical History:  Diagnosis Date  . Achalasia   . Barrett's esophagus with dysplasia   . Diabetes mellitus without complication ([place])   . GERD (gastroesophageal reflux disease)   . Hyperlipidemia   . Hypertension   . MRSA infection    lung  . Thyroid disease    Past Surgical History:  Procedure Laterality Date  . ABDOMINAL HYSTERECTOMY    . achalasia    . HIATAL HERNIA REPAIR    . KNEE ARTHROPLASTY Right 04/01/2016   Procedure: COMPUTER ASSISTED TOTAL KNEE ARTHROPLASTY;  Surgeon: Dereck Leep, MD;  Location: [facility] ORS;  Service: Orthopedics;  Laterality: Right;  . THYROID EXPLORATION     Family History  Problem Relation Age of Onset  . Heart disease Mother    History  Sexual Activity  . Sexual activity: Not on file    Outpatient Encounter Prescriptions as of 07/15/2016  Medication Sig  . Alpha-Lipoic Acid 200 MG TABS Take 200 mg by mouth 2 (two) times daily.   . Cholecalciferol (VITAMIN D-3) 5000 units TABS Take by mouth daily with supper.  Marland Kitchen COENZYME Q10-OMEGA 3 FATTY ACD PO Take 1 capsule by mouth 2 (two) times daily.  Marland Kitchen docusate sodium (COLACE) 100 MG capsule Take 200 mg by mouth at bedtime.  . Fenofibric Acid 105 MG TABS Take 1 tablet (105 mg total) by mouth daily. (Patient taking differently: Take 105 mg by mouth daily with breakfast. )  .  glimepiride (AMARYL) 1 MG tablet Take 2 tablets (2 mg total) by mouth daily with breakfast.  . hydrocortisone (PROCTOZONE-HC) 2.5 % rectal cream Place 1 application rectally 2 (two) times daily. (Patient taking differently: Place 1 application rectally 2 (two) times daily as needed for hemorrhoids or itching. )  . losartan-hydrochlorothiazide (HYZAAR) 100-25 MG tablet Take 1 tablet by mouth daily.  Marland Kitchen MELATONIN PO Take 0.9 mg by mouth at bedtime. SLEEP ANSWER  . metoprolol succinate (TOPROL-XL) 100 MG 24 hr tablet Take 1 tablet (100 mg total) by mouth daily. Take with or immediately following a meal. (Patient taking differently: Take 100 mg by mouth daily with breakfast. Take with or immediately following a meal.)  . Multiple Vitamin (MULTIVITAMINS PO) Take 1 packet by mouth 2 (two) times daily. Forward Gold Daily Regimen (multivitamin/bone&balance/omega 3) By Dr. Lanell Matar  . mupirocin cream (BACTROBAN) 2 % Apply 1 application topically 2 (two) times daily. (Patient taking differently: Apply 1 application topically 2 (two) times daily as needed (applied in nose when needed for infection). )  . NON FORMULARY Take 2 capsules by mouth daily with supper. Advanced Lung & Bronchial Support (Bromelain, N-Acetyl L-Cysteine, Quercetin, and a variety of traditional herbs-Astragalus, Marshmallow, and [facility])  . NONFORMULARY OR COMPOUNDED ITEM Take 2 capsules by mouth  at bedtime. Lung and bronchial support  . OVER THE COUNTER MEDICATION Take 1 capsule by mouth daily with supper. OMEGA Q PLUS 100 (RESVERATROL)  . Probiotic Product (PROBIOTIC PO) Take 1 capsule by mouth daily before breakfast.  . bisacodyl (DUCODYL) 5 MG EC tablet Take 5 mg by mouth at bedtime.  . celecoxib (CELEBREX) 200 MG capsule Take 1 capsule (200 mg total) by mouth every 12 (twelve) hours. (Patient not taking: Reported on 07/15/2016)  . Multiple Vitamins-Iron (CHLORELLA PO) Take 7-8 tablets by mouth 2 (two) times daily. 8 TABLETS IN THE  MORNING WITH BREAKFAST & 7 TABLET IN THE EVENING WITH SUPPER  . potassium chloride SA (K-DUR,KLOR-CON) 20 MEQ tablet Take 1 tablet (20 mEq total) by mouth 2 (two) times daily. (Patient not taking: Reported on 07/15/2016)  . [DISCONTINUED] enoxaparin (LOVENOX) 40 MG/0.4ML injection Inject 0.4 mLs (40 mg total) into the skin daily. (Patient not taking: Reported on 07/15/2016)  . [DISCONTINUED] Multiple Vitamins-Minerals (EYE VITAMINS PO) Take 2 capsules by mouth 2 (two) times daily. VISION ESSENTIALS GOLD  . [DISCONTINUED] oxyCODONE (OXY IR/ROXICODONE) 5 MG immediate release tablet Take 1-2 tablets (5-10 mg total) by mouth every 4 (four) hours as needed for severe pain or breakthrough pain. (Patient not taking: Reported on 07/15/2016)  . [DISCONTINUED] traMADol (ULTRAM) 50 MG tablet Take 1-2 tablets (50-100 mg total) by mouth every 6 (six) hours as needed for moderate pain. (Patient not taking: Reported on 07/15/2016)   No facility-administered encounter medications on file as of 07/15/2016.     Activities of Daily Living In your present state of health, do you have any difficulty performing the following activities: 07/15/2016 04/01/2016  Hearing? N Y  Vision? N N  Difficulty concentrating or making decisions? N N  Walking or climbing stairs? N Y  Dressing or bathing? N N  Doing errands, shopping? N N  Preparing Food and eating ? N -  Using the Toilet? N -  In the past six months, have you accidently leaked urine? N -  Do you have problems with loss of bowel control? N -  Managing your Medications? N -  Managing your Finances? N -  Housekeeping or managing your Housekeeping? N -  Some recent data might be hidden    Patient Care Team: Guadalupe Maple, MD as PCP - General (Family Medicine)    Assessment:     Exercise Activities and Dietary recommendations Current Exercise Habits: Home exercise routine, Type of exercise: strength training/weights (PT), Time (Minutes): 60, Frequency  (Times/Week): 5, Weekly Exercise (Minutes/Week): 300, Intensity: Mild, Exercise limited by: orthopedic condition(s)  Goals    . Increase water intake          Recommend drinking at least 4-5 glasses of water a day      Fall Risk Fall Risk  07/15/2016 02/05/2016 11/19/2014  Falls in the past year? No Yes No  Number falls in past yr: - 2 or more -  Injury with Fall? - No -  Follow up - Falls evaluation completed -   Depression Screen PHQ 2/9 Scores 07/15/2016 11/19/2014  PHQ - 2 Score 0 1     Cognitive Function        Immunization History  Administered Date(s) Administered  . Influenza,inj,Quad PF,36+ Mos 11/19/2014  . Influenza-Unspecified 10/17/2015  . Pneumococcal Conjugate-13 06/12/2013  . Pneumococcal-Unspecified 09/06/1995, 02/05/2001  . Td 04/01/2006  . Zoster 02/11/2009   Screening Tests Health Maintenance  Topic Date Due  . FOOT EXAM  11/19/2015  .  TETANUS/TDAP  03/31/2016  . INFLUENZA VACCINE  08/05/2016  . HEMOGLOBIN A1C  09/20/2016  . OPHTHALMOLOGY EXAM  05/12/2017  . DEXA SCAN  Completed  . PNA vac Low Risk Adult  Completed      Plan:     I have personally reviewed and addressed the Medicare Annual Wellness questionnaire and have noted the following in the patient's chart:  A. Medical and social history B. Use of alcohol, tobacco or illicit drugs  C. Current medications and supplements D. Functional ability and status E.  Nutritional status F.  Physical activity G. Advance directives H. List of other physicians I.  Hospitalizations, surgeries, and ER visits in previous 12 months J.  [place] such as hearing and vision if needed, cognitive and depression L. Referrals and appointments - none  In addition, I have reviewed and discussed with patient certain preventive protocols, quality metrics, and best practice recommendations. A written personalized care plan for preventive services as well as general preventive health recommendations  were provided to patient.   Signed,  Tyler Aas, LPN Nurse Health Advisor   MD Recommendations: due for diabetic foot exam at next visit.    I have reviewed the nurse health advisor's Medicare wellness visit note and agree with the findings and recommendations listed above. Golden Pop MD

## 2016-07-16 DIAGNOSIS — M545 Low back pain: Secondary | ICD-10-CM | POA: Diagnosis not present

## 2016-07-20 ENCOUNTER — Telehealth: Payer: Self-pay | Admitting: Family Medicine

## 2016-07-20 DIAGNOSIS — M545 Low back pain: Secondary | ICD-10-CM | POA: Diagnosis not present

## 2016-07-20 NOTE — Telephone Encounter (Signed)
Patient needs a script sent to her new mail order [facility] at fax /# [phone]  For her one touch ultra blue test strips  Thanks

## 2016-07-20 NOTE — Telephone Encounter (Signed)
Form filled out. Will see if another provider will sign in absence of Dr. Jeananne Rama.

## 2016-07-20 NOTE — Telephone Encounter (Signed)
Form faxed to [facility] as requested by the patient. Patient notified that this was completed.

## 2016-07-23 DIAGNOSIS — M545 Low back pain: Secondary | ICD-10-CM | POA: Diagnosis not present

## 2016-08-04 ENCOUNTER — Ambulatory Visit (INDEPENDENT_AMBULATORY_CARE_PROVIDER_SITE_OTHER): Payer: PPO | Admitting: Family Medicine

## 2016-08-04 ENCOUNTER — Encounter: Payer: PPO | Admitting: Family Medicine

## 2016-08-04 ENCOUNTER — Encounter: Payer: Self-pay | Admitting: Family Medicine

## 2016-08-04 VITALS — BP 129/71 | HR 73 | Wt 142.0 lb

## 2016-08-04 DIAGNOSIS — Z1329 Encounter for screening for other suspected endocrine disorder: Secondary | ICD-10-CM | POA: Diagnosis not present

## 2016-08-04 DIAGNOSIS — Z0000 Encounter for general adult medical examination without abnormal findings: Secondary | ICD-10-CM | POA: Diagnosis not present

## 2016-08-04 DIAGNOSIS — E785 Hyperlipidemia, unspecified: Secondary | ICD-10-CM

## 2016-08-04 DIAGNOSIS — I10 Essential (primary) hypertension: Secondary | ICD-10-CM

## 2016-08-04 DIAGNOSIS — E119 Type 2 diabetes mellitus without complications: Secondary | ICD-10-CM

## 2016-08-04 DIAGNOSIS — Z7189 Other specified counseling: Secondary | ICD-10-CM | POA: Diagnosis not present

## 2016-08-04 DIAGNOSIS — E782 Mixed hyperlipidemia: Secondary | ICD-10-CM | POA: Diagnosis not present

## 2016-08-04 MED ORDER — METOPROLOL SUCCINATE ER 100 MG PO TB24: 100.0000 mg | ORAL_TABLET | Freq: Every day | ORAL | 12 refills | Status: DC

## 2016-08-04 MED ORDER — FENOFIBRIC ACID 105 MG PO TABS: 1.0000 | ORAL_TABLET | Freq: Every day | ORAL | 12 refills | Status: DC

## 2016-08-04 MED ORDER — LOSARTAN POTASSIUM-HCTZ 100-25 MG PO TABS: 1.0000 | ORAL_TABLET | Freq: Every day | ORAL | 12 refills | Status: DC

## 2016-08-04 MED ORDER — GLIMEPIRIDE 1 MG PO TABS: 1.0000 mg | ORAL_TABLET | Freq: Every day | ORAL | 12 refills | Status: DC

## 2016-08-04 NOTE — Assessment & Plan Note (Signed)
The current medical regimen is effective;  continue present plan and medications.  

## 2016-08-04 NOTE — Assessment & Plan Note (Signed)
A voluntary discussion about advance care planning including the explanation and discussion of advance directives was extensively discussed  with the patient.  Explanation about the health care proxy and Living will was reviewed and packet with forms with explanation of how to fill them out was given. Patient has completed and will returne copy to [place].

## 2016-08-04 NOTE — Progress Notes (Signed)
BP 129/71   Pulse 73   Wt 142 lb (64.4 kg)   SpO2 95%   BMI 25.97 kg/m    Subjective:    Patient ID: Deanna Bird, female    DOB: 02-Apr-1928, 83 y.o.   MRN: [id number]  HPI: Deanna Bird is a 83 y.o. female  Chief Complaint  Patient presents with  . Annual Exam  . Knee Pain  Patient's knee had knee replacement on the right knee now her left knee bothers her. Has orthopedic appointment next month otherwise right knee with knee replacement is doing well. Taking glyburide just one a day has had some low blood sugar spells but no further low blood sugar spells. Takes blood pressure without problems also cholesterol without problems.  Relevant past medical, surgical, family and social history reviewed and updated as indicated. Interim medical history since our last visit reviewed. Allergies and medications reviewed and updated.  Review of Systems  Constitutional: Negative.   HENT: Negative.   Eyes: Negative.   Respiratory: Negative.   Cardiovascular: Negative.   Gastrointestinal: Negative.   Endocrine: Negative.   Genitourinary: Negative.   Musculoskeletal: Negative.   Skin: Negative.   Allergic/Immunologic: Negative.   Neurological: Negative.   Hematological: Negative.   Psychiatric/Behavioral: Negative.     Per HPI unless specifically indicated above     Objective:    BP 129/71   Pulse 73   Wt 142 lb (64.4 kg)   SpO2 95%   BMI 25.97 kg/m   Wt Readings from Last 3 Encounters:  08/04/16 142 lb (64.4 kg)  07/15/16 144 lb 6.4 oz (65.5 kg)  04/01/16 150 lb (68 kg)    Physical Exam  Constitutional: She is oriented to person, place, and time. She appears well-developed and well-nourished.  HENT:  Head: Normocephalic and atraumatic.  Right Ear: External ear normal.  Left Ear: External ear normal.  Nose: Nose normal.  Mouth/Throat: Oropharynx is clear and moist.  Eyes: Pupils are equal, round, and reactive to light. Conjunctivae and EOM are normal.  Neck:  Normal range of motion. Neck supple. Carotid bruit is not present.  Cardiovascular: Normal rate, regular rhythm and normal heart sounds.   No murmur heard. Pulmonary/Chest: Effort normal and breath sounds normal. She exhibits no mass. Right breast exhibits no mass, no skin change and no tenderness. Left breast exhibits no mass, no skin change and no tenderness. Breasts are symmetrical.  Abdominal: Soft. Bowel sounds are normal. There is no hepatosplenomegaly.  Musculoskeletal: Normal range of motion.  Neurological: She is alert and oriented to person, place, and time.  Skin: No rash noted.  Psychiatric: She has a normal mood and affect. Her behavior is normal. Judgment and thought content normal.    Results for orders placed or performed in visit on 05/15/16  HM DIABETES EYE EXAM  Result Value Ref Range   HM Diabetic Eye Exam No Retinopathy No Retinopathy      Assessment & Plan:   Problem List Items Addressed This Visit      Cardiovascular and Mediastinum   Hypertension - Primary    The current medical regimen is effective;  continue present plan and medications.       Relevant Orders   CBC with Differential/Platelet     Endocrine   Diabetes mellitus without complication ([place])    The current medical regimen is effective;  continue present plan and medications.       Relevant Orders   Comprehensive metabolic panel  Urinalysis, Routine w reflex microscopic   Bayer DCA Hb A1c Waived     Other   Hyperlipidemia    The current medical regimen is effective;  continue present plan and medications.       Relevant Orders   Lipid panel   Advanced care planning/counseling discussion    A voluntary discussion about advance care planning including the explanation and discussion of advance directives was extensively discussed  with the patient.  Explanation about the health care proxy and Living will was reviewed and packet with forms with explanation of how to fill them out was  given. Patient has completed and will returne copy to [place].       Other Visit Diagnoses    Annual physical exam       Thyroid disorder screen       Relevant Orders   TSH       Follow up plan: Return in about 6 months (around 02/04/2017) for Hemoglobin A1c, BMP,  Lipids, ALT, AST.

## 2016-08-05 ENCOUNTER — Telehealth: Payer: Self-pay | Admitting: Family Medicine

## 2016-08-05 DIAGNOSIS — R7989 Other specified abnormal findings of blood chemistry: Secondary | ICD-10-CM

## 2016-08-05 LAB — CBC WITH DIFFERENTIAL/PLATELET
BASOS: 0 %
Basophils Absolute: 0 10*3/uL (ref 0.0–0.2)
EOS (ABSOLUTE): 0.3 10*3/uL (ref 0.0–0.4)
EOS: 5 %
Hematocrit: 35.5 % (ref 34.0–46.6)
Hemoglobin: 11.4 g/dL (ref 11.1–15.9)
Immature Grans (Abs): 0 10*3/uL (ref 0.0–0.1)
Immature Granulocytes: 0 %
Lymphocytes Absolute: 1.8 10*3/uL (ref 0.7–3.1)
Lymphs: 30 %
MCH: 28.6 pg (ref 26.6–33.0)
MCHC: 32.1 g/dL (ref 31.5–35.7)
MCV: 89 fL (ref 79–97)
MONOS ABS: 0.3 10*3/uL (ref 0.1–0.9)
Monocytes: 4 %
NEUTROS ABS: 3.6 10*3/uL (ref 1.4–7.0)
NEUTROS PCT: 61 %
Platelets: 244 10*3/uL (ref 150–379)
RBC: 3.99 x10E6/uL (ref 3.77–5.28)
RDW: 16.2 % — AB (ref 12.3–15.4)
WBC: 6 10*3/uL (ref 3.4–10.8)

## 2016-08-05 LAB — COMPREHENSIVE METABOLIC PANEL
ALBUMIN: 3.8 g/dL (ref 3.5–4.7)
ALK PHOS: 36 IU/L — AB (ref 39–117)
ALT: 17 IU/L (ref 0–32)
AST: 29 IU/L (ref 0–40)
Albumin/Globulin Ratio: 1.7 (ref 1.2–2.2)
BILIRUBIN TOTAL: 0.6 mg/dL (ref 0.0–1.2)
BUN / CREAT RATIO: 26 (ref 12–28)
BUN: 31 mg/dL — AB (ref 8–27)
CHLORIDE: 103 mmol/L (ref 96–106)
CO2: 28 mmol/L (ref 20–29)
CREATININE: 1.2 mg/dL — AB (ref 0.57–1.00)
Calcium: 9.7 mg/dL (ref 8.7–10.3)
GFR calc Af Amer: 48 mL/min/{1.73_m2} — ABNORMAL LOW (ref >59)
GFR calc non Af Amer: 41 mL/min/{1.73_m2} — ABNORMAL LOW (ref >59)
GLUCOSE: 65 mg/dL (ref 65–99)
Globulin, Total: 2.3 g/dL (ref 1.5–4.5)
Potassium: 4.2 mmol/L (ref 3.5–5.2)
SODIUM: 146 mmol/L — AB (ref 134–144)
Total Protein: 6.1 g/dL (ref 6.0–8.5)

## 2016-08-05 LAB — LIPID PANEL
CHOLESTEROL TOTAL: 107 mg/dL (ref 100–199)
Chol/HDL Ratio: 4.5 ratio — ABNORMAL HIGH (ref 0.0–4.4)
HDL: 24 mg/dL — ABNORMAL LOW (ref >39)
LDL CALC: 59 mg/dL (ref 0–99)
TRIGLYCERIDES: 119 mg/dL (ref 0–149)
VLDL Cholesterol Cal: 24 mg/dL (ref 5–40)

## 2016-08-05 LAB — TSH: TSH: 7.49 u[IU]/mL — ABNORMAL HIGH (ref 0.450–4.500)

## 2016-08-05 NOTE — Telephone Encounter (Signed)
Phone call Discussed with patient blood work indicating TSH is elevated will recheck TSH in 2 months or so.

## 2016-08-10 LAB — URINALYSIS, ROUTINE W REFLEX MICROSCOPIC
Bilirubin, UA: NEGATIVE
GLUCOSE, UA: NEGATIVE
KETONES UA: NEGATIVE
NITRITE UA: NEGATIVE
Protein, UA: NEGATIVE
RBC, UA: NEGATIVE
SPEC GRAV UA: 1.02 (ref 1.005–1.030)
UUROB: 0.2 mg/dL (ref 0.2–1.0)
pH, UA: 6 (ref 5.0–7.5)

## 2016-08-10 LAB — BAYER DCA HB A1C WAIVED: HB A1C: 5.1 % (ref <7.0)

## 2016-08-10 LAB — MICROSCOPIC EXAMINATION: BACTERIA UA: NONE SEEN

## 2016-08-12 ENCOUNTER — Telehealth: Payer: Self-pay | Admitting: Family Medicine

## 2016-08-12 NOTE — Telephone Encounter (Signed)
Pt called and would like to have a letter with her lab results.

## 2016-08-12 NOTE — Telephone Encounter (Signed)
Labs printed and place in outgoing mail box

## 2016-09-10 DIAGNOSIS — M25561 Pain in right knee: Secondary | ICD-10-CM | POA: Diagnosis not present

## 2016-09-10 DIAGNOSIS — M25562 Pain in left knee: Secondary | ICD-10-CM | POA: Diagnosis not present

## 2016-09-10 DIAGNOSIS — M1712 Unilateral primary osteoarthritis, left knee: Secondary | ICD-10-CM | POA: Diagnosis not present

## 2016-09-10 DIAGNOSIS — Z96651 Presence of right artificial knee joint: Secondary | ICD-10-CM | POA: Diagnosis not present

## 2016-09-10 DIAGNOSIS — M7061 Trochanteric bursitis, right hip: Secondary | ICD-10-CM | POA: Diagnosis not present

## 2016-09-10 DIAGNOSIS — G8929 Other chronic pain: Secondary | ICD-10-CM | POA: Diagnosis not present

## 2016-10-01 DIAGNOSIS — E119 Type 2 diabetes mellitus without complications: Secondary | ICD-10-CM | POA: Diagnosis not present

## 2016-10-01 DIAGNOSIS — H353131 Nonexudative age-related macular degeneration, bilateral, early dry stage: Secondary | ICD-10-CM | POA: Diagnosis not present

## 2016-10-01 LAB — HM DIABETES EYE EXAM

## 2016-10-07 DIAGNOSIS — M1712 Unilateral primary osteoarthritis, left knee: Secondary | ICD-10-CM | POA: Diagnosis not present

## 2016-11-02 ENCOUNTER — Other Ambulatory Visit: Payer: PPO

## 2016-11-02 DIAGNOSIS — R7989 Other specified abnormal findings of blood chemistry: Secondary | ICD-10-CM

## 2016-11-03 ENCOUNTER — Encounter: Payer: Self-pay | Admitting: Family Medicine

## 2016-11-03 LAB — TSH: TSH: 3.39 u[IU]/mL (ref 0.450–4.500)

## 2016-11-19 ENCOUNTER — Other Ambulatory Visit: Payer: Self-pay

## 2016-11-19 ENCOUNTER — Encounter
Admission: RE | Admit: 2016-11-19 | Discharge: 2016-11-19 | Disposition: A | Discharge status: Home / Self Care | Payer: PPO | Source: Ambulatory Visit | Attending: Orthopedic Surgery | Admitting: Orthopedic Surgery

## 2016-11-19 DIAGNOSIS — Z01810 Encounter for preprocedural cardiovascular examination: Secondary | ICD-10-CM | POA: Insufficient documentation

## 2016-11-19 DIAGNOSIS — Z01812 Encounter for preprocedural laboratory examination: Secondary | ICD-10-CM | POA: Insufficient documentation

## 2016-11-19 DIAGNOSIS — I10 Essential (primary) hypertension: Secondary | ICD-10-CM | POA: Insufficient documentation

## 2016-11-19 LAB — COMPREHENSIVE METABOLIC PANEL
ALT: 23 U/L (ref 14–54)
AST: 38 U/L (ref 15–41)
Albumin: 3.7 g/dL (ref 3.5–5.0)
Alkaline Phosphatase: 37 U/L — ABNORMAL LOW (ref 38–126)
Anion gap: 9 (ref 5–15)
BUN: 34 mg/dL — ABNORMAL HIGH (ref 6–20)
CHLORIDE: 102 mmol/L (ref 101–111)
CO2: 30 mmol/L (ref 22–32)
CREATININE: 1.28 mg/dL — AB (ref 0.44–1.00)
Calcium: 9.4 mg/dL (ref 8.9–10.3)
GFR, EST AFRICAN AMERICAN: 43 mL/min — AB (ref >60)
GFR, EST NON AFRICAN AMERICAN: 37 mL/min — AB (ref >60)
Glucose, Bld: 50 mg/dL — ABNORMAL LOW (ref 65–99)
POTASSIUM: 3.4 mmol/L — AB (ref 3.5–5.1)
Sodium: 141 mmol/L (ref 135–145)
Total Bilirubin: 1 mg/dL (ref 0.3–1.2)
Total Protein: 6.7 g/dL (ref 6.5–8.1)

## 2016-11-19 LAB — URINALYSIS, ROUTINE W REFLEX MICROSCOPIC
Bilirubin Urine: NEGATIVE
GLUCOSE, UA: NEGATIVE mg/dL
Hgb urine dipstick: NEGATIVE
KETONES UR: NEGATIVE mg/dL
LEUKOCYTES UA: NEGATIVE
Nitrite: NEGATIVE
PH: 6 (ref 5.0–8.0)
Protein, ur: NEGATIVE mg/dL
SPECIFIC GRAVITY, URINE: 1.015 (ref 1.005–1.030)

## 2016-11-19 LAB — CBC
HCT: 39.3 % (ref 35.0–47.0)
Hemoglobin: 12.9 g/dL (ref 12.0–16.0)
MCH: 30.1 pg (ref 26.0–34.0)
MCHC: 32.8 g/dL (ref 32.0–36.0)
MCV: 91.8 fL (ref 80.0–100.0)
PLATELETS: 234 10*3/uL (ref 150–440)
RBC: 4.29 MIL/uL (ref 3.80–5.20)
RDW: 14.9 % — AB (ref 11.5–14.5)
WBC: 7.7 10*3/uL (ref 3.6–11.0)

## 2016-11-19 LAB — PROTIME-INR
INR: 0.93
PROTHROMBIN TIME: 12.4 s (ref 11.4–15.2)

## 2016-11-19 LAB — APTT: aPTT: 29 seconds (ref 24–36)

## 2016-11-19 LAB — SURGICAL PCR SCREEN
MRSA, PCR: NEGATIVE
STAPHYLOCOCCUS AUREUS: NEGATIVE

## 2016-11-19 LAB — C-REACTIVE PROTEIN

## 2016-11-19 LAB — SEDIMENTATION RATE: SED RATE: 10 mm/h (ref 0–30)

## 2016-11-19 LAB — TYPE AND SCREEN
ABO/RH(D): O POS
ANTIBODY SCREEN: NEGATIVE

## 2016-11-19 LAB — HEMOGLOBIN A1C
Hgb A1c MFr Bld: 5.6 % (ref 4.8–5.6)
Mean Plasma Glucose: 114.02 mg/dL

## 2016-11-19 NOTE — Pre-Procedure Instructions (Signed)
Faxed abnormal lab results to Dr. Hooten's office. 

## 2016-11-19 NOTE — Pre-Procedure Instructions (Signed)
Patient notified of lab results K-3.4 instructed on high potassium foods and Glucose of 50.  Instructed to call PCP and report recent glucose and reviewed signs and symptoms of hypoglycemia.

## 2016-11-19 NOTE — Patient Instructions (Addendum)
Your procedure is scheduled on: Mon 11/26 Report to Day Surgery. To find out your arrival time please call [phone] between 1PM - 3PM on Friday 11/23  Remember: Instructions that are not followed completely may result in serious medical risk, up to and including death, or upon the discretion of your surgeon and anesthesiologist your surgery may need to be rescheduled.     _X__ 1. Do not eat food after midnight the night before your procedure.                 No gum chewing or hard candies. You may drink water up to 2 hours                 before you are scheduled to arrive for your surgery- DO not drink clear                 liquids within 2 hours of the start of your surgery.                  __ 2.  No Alcohol for 24 hours before or after surgery.   ___ 3.  Do Not Smoke or use e-cigarettes For 24 Hours Prior to Your Surgery.                 Do not use any chewable tobacco products for at least 6 hours prior to                 surgery.  ____  4.  Bring all medications with you on the day of surgery if instructed.   __x__  5.  Notify your doctor if there is any change in your medical condition      (cold, fever, infections).     Do not wear jewelry, make-up, hairpins, clips or nail polish. Do not wear lotions, powders, or perfumes. You may wear deodorant. Do not shave 48 hours prior to surgery. Men may shave face and neck. Do not bring valuables to the hospital.    [facility] is not responsible for any belongings or valuables.  Contacts, dentures or bridgework may not be worn into surgery. Leave your suitcase in the car. After surgery it may be brought to your room. For patients admitted to the hospital, discharge time is determined by your treatment team.   Patients discharged the day of surgery will not be allowed to drive home.   Please read over the following fact sheets that you were given:   MRSA Information          __x__ Take these  medicines the morning of surgery with A SIP OF WATER:    1. metoprolol succinate (TOPROL-XL) 100 MG 24 hr tablet  2. Oxycodone if needed for pain  3.   4.  5.  6.  ____ Fleet Enema (as directed)   __x__ Use CHG Soap as directed  ____ Use inhalers on the day of surgery  ____ Stop metformin 2 days prior to surgery    ____ Take 1/2 of usual insulin dose the night before surgery. No insulin the morning          of surgery.   ____ Stop Coumadin/Plavix/aspirin on  ___x_ Stop Anti-inflammatories on 11/19   __x__ Stop supplements until after surgery. ,Multiple Vitamin (MULTIVITAMINS PO),Omega Q Plus   ____ Bring C-Pap to the hospital.

## 2016-11-20 LAB — URINE CULTURE
CULTURE: NO GROWTH
Special Requests: NORMAL

## 2016-11-21 NOTE — Pre-Procedure Instructions (Signed)
EKG sent to Anesthesia for review. 

## 2016-11-25 ENCOUNTER — Telehealth: Payer: Self-pay | Admitting: Family Medicine

## 2016-11-25 ENCOUNTER — Other Ambulatory Visit: Payer: Self-pay | Admitting: Family Medicine

## 2016-11-25 MED ORDER — POTASSIUM CHLORIDE CRYS ER 20 MEQ PO TBCR
20.0000 meq | EXTENDED_RELEASE_TABLET | Freq: Every day | ORAL | 0 refills | Status: DC
Start: 2016-11-25 — End: 2017-02-04

## 2016-11-25 NOTE — Telephone Encounter (Signed)
Fax has been received, it is on provider's desk for review. Will fax back/contact patient once he had reviewed it.

## 2016-11-25 NOTE — Telephone Encounter (Signed)
Copied from [place] [id number]. Topic: Inquiry >> Nov 25, 2016 11:01 AM Oliver Pila B wrote: Reason for CRM: PT called to ask for Dr. Jeananne Rama if he received the email from Dr. Maree Krabbe office about the potassium advice, contact pt to advise  >> Nov 25, 2016  3:17 PM Boyd Kerbs wrote: Patient is having knee replacement on Monday. 11/26,  Dr. Jeananne Rama could look at patient for potassium. Does she need prescription for potassium, pre- op lab says it is low.  She will need this today.   Please call [place] and let her know if called into pharmacy or not.  Pharmacy closes at 7pm.

## 2016-11-25 NOTE — Telephone Encounter (Signed)
Copied from [place] [id number]. Topic: Inquiry >> Nov 25, 2016 11:01 AM Oliver Pila B wrote: Reason for CRM: PT called to ask for Dr. Jeananne Rama if he received the email from Dr. Maree Krabbe office about the potassium advice, contact pt to advise

## 2016-11-25 NOTE — Telephone Encounter (Signed)
Has been received. Please see prior telephone message

## 2016-11-25 NOTE — Progress Notes (Signed)
Phone call Discussed with patient low potassium will start potassium replacement therapy and will recheck after surgery.

## 2016-11-29 MED ORDER — TRANEXAMIC ACID 1000 MG/10ML IV SOLN
1000.0000 mg | INTRAVENOUS | Status: DC
  Filled 2016-11-29: qty 10

## 2016-11-29 MED ORDER — CEFAZOLIN SODIUM-DEXTROSE 2-4 GM/100ML-% IV SOLN: 2.0000 g | INTRAVENOUS | Status: DC

## 2016-11-30 ENCOUNTER — Encounter
Admission: RE | Disposition: A | Discharge status: Home Health | Payer: Self-pay | Source: Ambulatory Visit | Attending: Orthopedic Surgery

## 2016-11-30 ENCOUNTER — Inpatient Hospital Stay: Payer: PPO | Admitting: Anesthesiology

## 2016-11-30 ENCOUNTER — Inpatient Hospital Stay: Payer: PPO

## 2016-11-30 ENCOUNTER — Encounter: Payer: Self-pay | Admitting: Orthopedic Surgery

## 2016-11-30 ENCOUNTER — Other Ambulatory Visit: Payer: Self-pay

## 2016-11-30 ENCOUNTER — Inpatient Hospital Stay
Admission: RE | Admit: 2016-11-30 | Discharge: 2016-12-03 | DRG: 470 | Disposition: A | Discharge status: Home Health | Payer: PPO | Source: Ambulatory Visit | Attending: Orthopedic Surgery | Admitting: Orthopedic Surgery

## 2016-11-30 DIAGNOSIS — Z96659 Presence of unspecified artificial knee joint: Secondary | ICD-10-CM

## 2016-11-30 DIAGNOSIS — Z885 Allergy status to narcotic agent status: Secondary | ICD-10-CM

## 2016-11-30 DIAGNOSIS — Z471 Aftercare following joint replacement surgery: Secondary | ICD-10-CM | POA: Diagnosis not present

## 2016-11-30 DIAGNOSIS — E079 Disorder of thyroid, unspecified: Secondary | ICD-10-CM | POA: Diagnosis not present

## 2016-11-30 DIAGNOSIS — I10 Essential (primary) hypertension: Secondary | ICD-10-CM | POA: Diagnosis not present

## 2016-11-30 DIAGNOSIS — E119 Type 2 diabetes mellitus without complications: Secondary | ICD-10-CM | POA: Diagnosis not present

## 2016-11-30 DIAGNOSIS — M1712 Unilateral primary osteoarthritis, left knee: Secondary | ICD-10-CM | POA: Diagnosis not present

## 2016-11-30 DIAGNOSIS — E785 Hyperlipidemia, unspecified: Secondary | ICD-10-CM | POA: Diagnosis present

## 2016-11-30 DIAGNOSIS — Z96652 Presence of left artificial knee joint: Secondary | ICD-10-CM

## 2016-11-30 DIAGNOSIS — M25562 Pain in left knee: Secondary | ICD-10-CM | POA: Diagnosis present

## 2016-11-30 DIAGNOSIS — K219 Gastro-esophageal reflux disease without esophagitis: Secondary | ICD-10-CM | POA: Diagnosis present

## 2016-11-30 HISTORY — PX: KNEE ARTHROPLASTY: SHX992

## 2016-11-30 LAB — POCT I-STAT 4, (NA,K, GLUC, HGB,HCT)
Glucose, Bld: 62 mg/dL — ABNORMAL LOW (ref 65–99)
HCT: 36 % (ref 36.0–46.0)
Hemoglobin: 12.2 g/dL (ref 12.0–15.0)
POTASSIUM: 4 mmol/L (ref 3.5–5.1)
SODIUM: 144 mmol/L (ref 135–145)

## 2016-11-30 LAB — GLUCOSE, CAPILLARY
GLUCOSE-CAPILLARY: 64 mg/dL — AB (ref 65–99)
GLUCOSE-CAPILLARY: 305 mg/dL — AB (ref 65–99)
Glucose-Capillary: 57 mg/dL — ABNORMAL LOW (ref 65–99)
Glucose-Capillary: 103 mg/dL — ABNORMAL HIGH (ref 65–99)
Glucose-Capillary: 138 mg/dL — ABNORMAL HIGH (ref 65–99)
Glucose-Capillary: 96 mg/dL (ref 65–99)

## 2016-11-30 SURGERY — ARTHROPLASTY, KNEE, TOTAL, USING IMAGELESS COMPUTER-ASSISTED NAVIGATION
Anesthesia: Spinal | Site: Knee | Laterality: Left | Wound class: Clean

## 2016-11-30 MED ORDER — DEXTROSE 50 % IV SOLN
25.0000 mL | Freq: Once | INTRAVENOUS | Status: AC
  Administered 2016-11-30: 25 mL via INTRAVENOUS

## 2016-11-30 MED ORDER — TRANEXAMIC ACID 1000 MG/10ML IV SOLN
1000.0000 mg | Freq: Once | INTRAVENOUS | Status: AC
  Administered 2016-11-30: 1000 mg via INTRAVENOUS
  Filled 2016-11-30: qty 10

## 2016-11-30 MED ORDER — PROPOFOL 500 MG/50ML IV EMUL
INTRAVENOUS | Status: AC
  Filled 2016-11-30: qty 50

## 2016-11-30 MED ORDER — DEXTROSE 5 % IV SOLN
2.0000 g | Freq: Four times a day (QID) | INTRAVENOUS | Status: AC
  Administered 2016-11-30 – 2016-12-01 (×4): 2 g via INTRAVENOUS
  Filled 2016-11-30 (×4): qty 20

## 2016-11-30 MED ORDER — DIPHENHYDRAMINE HCL 12.5 MG/5ML PO ELIX: 12.5000 mg | ORAL_SOLUTION | ORAL | Status: DC | PRN

## 2016-11-30 MED ORDER — FLEET ENEMA 7-19 GM/118ML RE ENEM: 1.0000 | ENEMA | Freq: Once | RECTAL | Status: DC | PRN

## 2016-11-30 MED ORDER — FENTANYL CITRATE (PF) 100 MCG/2ML IJ SOLN
INTRAMUSCULAR | Status: AC
  Filled 2016-11-30: qty 2

## 2016-11-30 MED ORDER — MORPHINE SULFATE (PF) 2 MG/ML IV SOLN: 2.0000 mg | INTRAVENOUS | Status: DC | PRN

## 2016-11-30 MED ORDER — TRANEXAMIC ACID 1000 MG/10ML IV SOLN
INTRAVENOUS | Status: DC | PRN
  Administered 2016-11-30: 1000 mg via INTRAVENOUS

## 2016-11-30 MED ORDER — PHENOL 1.4 % MT LIQD
1.0000 | OROMUCOSAL | Status: DC | PRN
  Filled 2016-11-30: qty 177

## 2016-11-30 MED ORDER — DEXAMETHASONE SODIUM PHOSPHATE 4 MG/ML IJ SOLN
INTRAMUSCULAR | Status: DC | PRN
  Administered 2016-11-30: 5 mg via INTRAVENOUS

## 2016-11-30 MED ORDER — NEOMYCIN-POLYMYXIN B GU 40-200000 IR SOLN
Status: AC
  Filled 2016-11-30: qty 20

## 2016-11-30 MED ORDER — FAMOTIDINE 20 MG PO TABS
20.0000 mg | ORAL_TABLET | Freq: Once | ORAL | Status: AC
  Administered 2016-11-30: 20 mg via ORAL

## 2016-11-30 MED ORDER — ACETAMINOPHEN 10 MG/ML IV SOLN
INTRAVENOUS | Status: DC | PRN
  Administered 2016-11-30: 1000 mg via INTRAVENOUS

## 2016-11-30 MED ORDER — CEFAZOLIN SODIUM-DEXTROSE 2-3 GM-%(50ML) IV SOLR
INTRAVENOUS | Status: DC | PRN
  Administered 2016-11-30: 2 g via INTRAVENOUS

## 2016-11-30 MED ORDER — DEXTROSE 50 % IV SOLN
INTRAVENOUS | Status: AC
  Filled 2016-11-30: qty 50

## 2016-11-30 MED ORDER — BUPIVACAINE HCL (PF) 0.5 % IJ SOLN
INTRAMUSCULAR | Status: DC | PRN
  Administered 2016-11-30: 2.7 mL

## 2016-11-30 MED ORDER — SODIUM CHLORIDE 0.9 % IJ SOLN
INTRAMUSCULAR | Status: AC
  Filled 2016-11-30: qty 50

## 2016-11-30 MED ORDER — OXYCODONE HCL 5 MG PO TABS
10.0000 mg | ORAL_TABLET | ORAL | Status: DC | PRN
  Administered 2016-12-01 – 2016-12-03 (×2): 10 mg via ORAL
  Filled 2016-11-30 (×2): qty 2

## 2016-11-30 MED ORDER — INSULIN ASPART 100 UNIT/ML ~~LOC~~ SOLN
0.0000 [IU] | Freq: Three times a day (TID) | SUBCUTANEOUS | Status: DC
  Filled 2016-11-30: qty 1

## 2016-11-30 MED ORDER — FENTANYL CITRATE (PF) 100 MCG/2ML IJ SOLN
INTRAMUSCULAR | Status: DC | PRN
  Administered 2016-11-30: 50 ug via INTRAVENOUS

## 2016-11-30 MED ORDER — ADULT MULTIVITAMIN W/MINERALS CH
1.0000 | ORAL_TABLET | Freq: Every day | ORAL | Status: DC
  Administered 2016-12-01 – 2016-12-03 (×3): 1 via ORAL
  Filled 2016-11-30 (×3): qty 1

## 2016-11-30 MED ORDER — ONDANSETRON HCL 4 MG/2ML IJ SOLN
4.0000 mg | Freq: Once | INTRAMUSCULAR | Status: DC | PRN
Start: 2016-11-30 — End: 2016-11-30

## 2016-11-30 MED ORDER — OXYCODONE HCL 5 MG PO TABS
5.0000 mg | ORAL_TABLET | ORAL | Status: DC | PRN
  Administered 2016-11-30 – 2016-12-02 (×8): 5 mg via ORAL
  Filled 2016-11-30 (×8): qty 1

## 2016-11-30 MED ORDER — METOCLOPRAMIDE HCL 10 MG PO TABS
10.0000 mg | ORAL_TABLET | Freq: Three times a day (TID) | ORAL | Status: AC
  Administered 2016-11-30 – 2016-12-02 (×6): 10 mg via ORAL
  Filled 2016-11-30 (×7): qty 1

## 2016-11-30 MED ORDER — ACETAMINOPHEN 10 MG/ML IV SOLN
1000.0000 mg | Freq: Four times a day (QID) | INTRAVENOUS | Status: AC
  Administered 2016-11-30 – 2016-12-01 (×3): 1000 mg via INTRAVENOUS
  Filled 2016-11-30 (×5): qty 100

## 2016-11-30 MED ORDER — ONDANSETRON HCL 4 MG PO TABS: 4.0000 mg | ORAL_TABLET | Freq: Four times a day (QID) | ORAL | Status: DC | PRN

## 2016-11-30 MED ORDER — RISAQUAD PO CAPS
1.0000 | ORAL_CAPSULE | Freq: Every day | ORAL | Status: DC
  Administered 2016-12-01 – 2016-12-03 (×3): 1 via ORAL
  Filled 2016-11-30 (×3): qty 1

## 2016-11-30 MED ORDER — SODIUM CHLORIDE 0.9 % IV SOLN
INTRAVENOUS | Status: DC | PRN
  Administered 2016-11-30: 60 mL

## 2016-11-30 MED ORDER — PANTOPRAZOLE SODIUM 40 MG PO TBEC
40.0000 mg | DELAYED_RELEASE_TABLET | Freq: Two times a day (BID) | ORAL | Status: DC
  Administered 2016-11-30 – 2016-12-03 (×6): 40 mg via ORAL
  Filled 2016-11-30 (×6): qty 1

## 2016-11-30 MED ORDER — BUPIVACAINE LIPOSOME 1.3 % IJ SUSP
INTRAMUSCULAR | Status: AC
  Filled 2016-11-30: qty 20

## 2016-11-30 MED ORDER — BUPIVACAINE HCL (PF) 0.5 % IJ SOLN
INTRAMUSCULAR | Status: AC
  Filled 2016-11-30: qty 10

## 2016-11-30 MED ORDER — SENNOSIDES-DOCUSATE SODIUM 8.6-50 MG PO TABS
1.0000 | ORAL_TABLET | Freq: Two times a day (BID) | ORAL | Status: DC
  Administered 2016-11-30 – 2016-12-03 (×6): 1 via ORAL
  Filled 2016-11-30 (×6): qty 1

## 2016-11-30 MED ORDER — METOPROLOL SUCCINATE ER 50 MG PO TB24
100.0000 mg | ORAL_TABLET | Freq: Every day | ORAL | Status: DC
  Administered 2016-12-02 – 2016-12-03 (×2): 100 mg via ORAL
  Filled 2016-11-30 (×2): qty 2

## 2016-11-30 MED ORDER — ONDANSETRON HCL 4 MG/2ML IJ SOLN: 4.0000 mg | Freq: Four times a day (QID) | INTRAMUSCULAR | Status: DC | PRN

## 2016-11-30 MED ORDER — SODIUM CHLORIDE 0.9 % IV SOLN
INTRAVENOUS | Status: DC
  Administered 2016-11-30: 10:00:00 via INTRAVENOUS

## 2016-11-30 MED ORDER — MENTHOL 3 MG MT LOZG
1.0000 | LOZENGE | OROMUCOSAL | Status: DC | PRN
  Filled 2016-11-30: qty 9

## 2016-11-30 MED ORDER — BISACODYL 10 MG RE SUPP
10.0000 mg | Freq: Every day | RECTAL | Status: DC | PRN
  Administered 2016-12-02: 10 mg via RECTAL
  Filled 2016-11-30: qty 1

## 2016-11-30 MED ORDER — GLYCOPYRROLATE 0.2 MG/ML IJ SOLN
INTRAMUSCULAR | Status: DC | PRN
  Administered 2016-11-30: 0.2 mg via INTRAVENOUS

## 2016-11-30 MED ORDER — HYDROCHLOROTHIAZIDE 25 MG PO TABS
25.0000 mg | ORAL_TABLET | Freq: Every day | ORAL | Status: DC
  Filled 2016-11-30 (×3): qty 1

## 2016-11-30 MED ORDER — BUPIVACAINE HCL (PF) 0.25 % IJ SOLN
INTRAMUSCULAR | Status: AC
  Filled 2016-11-30: qty 60

## 2016-11-30 MED ORDER — DEXTROSE 50 % IV SOLN
INTRAVENOUS | Status: AC
  Administered 2016-11-30: 12.5 g via INTRAVENOUS
  Filled 2016-11-30: qty 50

## 2016-11-30 MED ORDER — FENOFIBRATE 145 MG PO TABS
145.0000 mg | ORAL_TABLET | Freq: Every day | ORAL | Status: DC
  Administered 2016-12-01 – 2016-12-03 (×3): 145 mg via ORAL
  Filled 2016-11-30 (×4): qty 1

## 2016-11-30 MED ORDER — ALUM & MAG HYDROXIDE-SIMETH 200-200-20 MG/5ML PO SUSP: 30.0000 mL | ORAL | Status: DC | PRN

## 2016-11-30 MED ORDER — HYDROCORTISONE 2.5 % RE CREA
1.0000 "application " | TOPICAL_CREAM | Freq: Two times a day (BID) | RECTAL | Status: DC | PRN
  Filled 2016-11-30: qty 28.35

## 2016-11-30 MED ORDER — VITAMIN D 1000 UNITS PO TABS
5000.0000 [IU] | ORAL_TABLET | Freq: Every day | ORAL | Status: DC
Start: 2016-11-30 — End: 2016-12-03
  Administered 2016-11-30 – 2016-12-02 (×3): 5000 [IU] via ORAL
  Filled 2016-11-30 (×3): qty 5

## 2016-11-30 MED ORDER — PROPOFOL 10 MG/ML IV BOLUS
INTRAVENOUS | Status: AC
  Filled 2016-11-30: qty 20

## 2016-11-30 MED ORDER — MAGNESIUM HYDROXIDE 400 MG/5ML PO SUSP
30.0000 mL | Freq: Every day | ORAL | Status: DC | PRN
  Administered 2016-12-01: 30 mL via ORAL
  Filled 2016-11-30: qty 30

## 2016-11-30 MED ORDER — CEFAZOLIN SODIUM-DEXTROSE 2-4 GM/100ML-% IV SOLN
INTRAVENOUS | Status: AC
Start: 2016-11-30 — End: 2016-11-30
  Filled 2016-11-30: qty 100

## 2016-11-30 MED ORDER — CHLORHEXIDINE GLUCONATE 4 % EX LIQD: 60.0000 mL | Freq: Once | CUTANEOUS | Status: DC

## 2016-11-30 MED ORDER — NEOMYCIN-POLYMYXIN B GU 40-200000 IR SOLN
Status: DC | PRN
  Administered 2016-11-30: 14 mL

## 2016-11-30 MED ORDER — ENOXAPARIN SODIUM 30 MG/0.3ML ~~LOC~~ SOLN
30.0000 mg | SUBCUTANEOUS | Status: DC
  Administered 2016-12-01 – 2016-12-03 (×3): 30 mg via SUBCUTANEOUS
  Filled 2016-11-30 (×3): qty 0.3

## 2016-11-30 MED ORDER — PROPOFOL 10 MG/ML IV BOLUS
INTRAVENOUS | Status: DC | PRN
  Administered 2016-11-30 (×2): 12 mg via INTRAVENOUS

## 2016-11-30 MED ORDER — FERROUS SULFATE 325 (65 FE) MG PO TABS
325.0000 mg | ORAL_TABLET | Freq: Two times a day (BID) | ORAL | Status: DC
  Administered 2016-11-30 – 2016-12-03 (×6): 325 mg via ORAL
  Filled 2016-11-30 (×7): qty 1

## 2016-11-30 MED ORDER — ACETAMINOPHEN 10 MG/ML IV SOLN
INTRAVENOUS | Status: AC
  Filled 2016-11-30: qty 100

## 2016-11-30 MED ORDER — CEFAZOLIN SODIUM-DEXTROSE 2-4 GM/100ML-% IV SOLN: 2.0000 g | Freq: Four times a day (QID) | INTRAVENOUS | Status: DC

## 2016-11-30 MED ORDER — BUPIVACAINE HCL (PF) 0.25 % IJ SOLN
INTRAMUSCULAR | Status: DC | PRN
  Administered 2016-11-30: 60 mL

## 2016-11-30 MED ORDER — TRAMADOL HCL 50 MG PO TABS
50.0000 mg | ORAL_TABLET | ORAL | Status: DC | PRN
  Administered 2016-12-01: 100 mg via ORAL
  Administered 2016-12-01 – 2016-12-03 (×4): 50 mg via ORAL
  Filled 2016-11-30 (×2): qty 1
  Filled 2016-11-30: qty 2
  Filled 2016-11-30 (×2): qty 1

## 2016-11-30 MED ORDER — LOSARTAN POTASSIUM-HCTZ 100-25 MG PO TABS: 1.0000 | ORAL_TABLET | Freq: Every day | ORAL | Status: DC

## 2016-11-30 MED ORDER — DEXTROSE 50 % IV SOLN
12.5000 g | Freq: Once | INTRAVENOUS | Status: AC
  Administered 2016-11-30: 12.5 g via INTRAVENOUS

## 2016-11-30 MED ORDER — ACETAMINOPHEN 650 MG RE SUPP: 650.0000 mg | RECTAL | Status: DC | PRN

## 2016-11-30 MED ORDER — PROPOFOL 500 MG/50ML IV EMUL
INTRAVENOUS | Status: DC | PRN
  Administered 2016-11-30: 60 ug/kg/min via INTRAVENOUS

## 2016-11-30 MED ORDER — SODIUM CHLORIDE 0.9 % IV SOLN
INTRAVENOUS | Status: DC
  Administered 2016-11-30: 17:00:00 via INTRAVENOUS

## 2016-11-30 MED ORDER — SODIUM CHLORIDE 0.9 % IV SOLN
INTRAVENOUS | Status: DC | PRN
  Administered 2016-11-30: 30 ug/min via INTRAVENOUS
  Administered 2016-11-30: 25 ug/min via INTRAVENOUS

## 2016-11-30 MED ORDER — ACETAMINOPHEN 325 MG PO TABS: 650.0000 mg | ORAL_TABLET | ORAL | Status: DC | PRN

## 2016-11-30 MED ORDER — GLIMEPIRIDE 1 MG PO TABS
1.0000 mg | ORAL_TABLET | Freq: Every day | ORAL | Status: DC
  Administered 2016-12-01 – 2016-12-03 (×3): 1 mg via ORAL
  Filled 2016-11-30 (×3): qty 1

## 2016-11-30 MED ORDER — LOSARTAN POTASSIUM 50 MG PO TABS
100.0000 mg | ORAL_TABLET | Freq: Every day | ORAL | Status: DC
  Administered 2016-12-02 – 2016-12-03 (×2): 100 mg via ORAL
  Filled 2016-11-30 (×3): qty 2

## 2016-11-30 MED ORDER — FENTANYL CITRATE (PF) 100 MCG/2ML IJ SOLN
25.0000 ug | INTRAMUSCULAR | Status: DC | PRN
  Administered 2016-11-30: 25 ug via INTRAVENOUS

## 2016-11-30 MED ORDER — FAMOTIDINE 20 MG PO TABS
ORAL_TABLET | ORAL | Status: AC
  Administered 2016-11-30: 20 mg via ORAL
  Filled 2016-11-30: qty 1

## 2016-11-30 SURGICAL SUPPLY — 67 items
BAG URINE DRAINAGE (UROLOGICAL SUPPLIES) ×3 IMPLANT
BATTERY INSTRU NAVIGATION (MISCELLANEOUS) ×12 IMPLANT
BLADE SAW 1 (BLADE) ×3 IMPLANT
BLADE SAW 1/2 (BLADE) ×3 IMPLANT
BLADE SAW 70X12.5 (BLADE) IMPLANT
BONE CEMENT GENTAMICIN (Cement) ×6 IMPLANT
CANISTER SUCT 1200ML W/VALVE (MISCELLANEOUS) ×3 IMPLANT
CANISTER SUCT 3000ML PPV (MISCELLANEOUS) ×6 IMPLANT
CAPT KNEE TOTAL 3 ATTUNE ×3 IMPLANT
CEMENT BONE GENTAMICIN 40 (Cement) ×2 IMPLANT
COOLER POLAR GLACIER W/PUMP (MISCELLANEOUS) ×3 IMPLANT
CUFF TOURN 24 STER (MISCELLANEOUS) ×3 IMPLANT
CUFF TOURN 30 STER DUAL PORT (MISCELLANEOUS) IMPLANT
DRAPE SHEET LG 3/4 BI-LAMINATE (DRAPES) ×3 IMPLANT
DRSG DERMACEA 8X12 NADH (GAUZE/BANDAGES/DRESSINGS) ×3 IMPLANT
DRSG OPSITE POSTOP 4X14 (GAUZE/BANDAGES/DRESSINGS) ×3 IMPLANT
DRSG TEGADERM 4X4.75 (GAUZE/BANDAGES/DRESSINGS) ×3 IMPLANT
DURAPREP 26ML APPLICATOR (WOUND CARE) ×3 IMPLANT
ELECT CAUTERY BLADE 6.4 (BLADE) ×3 IMPLANT
ELECT REM PT RETURN 9FT ADLT (ELECTROSURGICAL) ×3
ELECTRODE REM PT RTRN 9FT ADLT (ELECTROSURGICAL) ×1 IMPLANT
EVACUATOR 1/8 PVC DRAIN (DRAIN) ×3 IMPLANT
EX-PIN ORTHOLOCK NAV 4X150 (PIN) ×6 IMPLANT
GLOVE BIOGEL M STRL SZ7.5 (GLOVE) ×6 IMPLANT
GLOVE BIOGEL PI IND STRL 7.5 (GLOVE) ×2 IMPLANT
GLOVE BIOGEL PI IND STRL 9 (GLOVE) ×1 IMPLANT
GLOVE BIOGEL PI INDICATOR 7.5 (GLOVE) ×4
GLOVE BIOGEL PI INDICATOR 9 (GLOVE) ×2
GLOVE INDICATOR 8.0 STRL GRN (GLOVE) ×3 IMPLANT
GLOVE SURG SYN 9.0  PF PI (GLOVE) ×4
GLOVE SURG SYN 9.0 PF PI (GLOVE) ×2 IMPLANT
GOWN STRL REUS W/ TWL LRG LVL3 (GOWN DISPOSABLE) ×3 IMPLANT
GOWN STRL REUS W/TWL 2XL LVL3 (GOWN DISPOSABLE) ×3 IMPLANT
GOWN STRL REUS W/TWL LRG LVL3 (GOWN DISPOSABLE) ×6
HOLDER FOLEY CATH W/STRAP (MISCELLANEOUS) ×3 IMPLANT
HOOD PEEL AWAY FLYTE STAYCOOL (MISCELLANEOUS) ×6 IMPLANT
KIT RM TURNOVER STRD PROC AR (KITS) ×3 IMPLANT
KNIFE SCULPS 14X20 (INSTRUMENTS) ×3 IMPLANT
LABEL OR SOLS (LABEL) ×3 IMPLANT
NDL SAFETY ECLIPSE 18X1.5 (NEEDLE) ×1 IMPLANT
NEEDLE HYPO 18GX1.5 SHARP (NEEDLE) ×2
NEEDLE SPNL 20GX3.5 QUINCKE YW (NEEDLE) ×6 IMPLANT
NS IRRIG 500ML POUR BTL (IV SOLUTION) ×3 IMPLANT
PACK TOTAL KNEE (MISCELLANEOUS) ×3 IMPLANT
PAD WRAPON POLAR KNEE (MISCELLANEOUS) ×1 IMPLANT
PIN DRILL QUICK PACK ×3 IMPLANT
PIN FIXATION 1/8DIA X 3INL (PIN) ×3 IMPLANT
PULSAVAC PLUS IRRIG FAN TIP (DISPOSABLE) ×3
SOL .9 NS 3000ML IRR  AL (IV SOLUTION) ×2
SOL .9 NS 3000ML IRR UROMATIC (IV SOLUTION) ×1 IMPLANT
SOL PREP PVP 2OZ (MISCELLANEOUS) ×3
SOLUTION PREP PVP 2OZ (MISCELLANEOUS) ×1 IMPLANT
SPONGE DRAIN TRACH 4X4 STRL 2S (GAUZE/BANDAGES/DRESSINGS) ×3 IMPLANT
STAPLER SKIN PROX 35W (STAPLE) ×3 IMPLANT
STRAP TIBIA SHORT (MISCELLANEOUS) ×3 IMPLANT
SUCTION FRAZIER HANDLE 10FR (MISCELLANEOUS) ×2
SUCTION TUBE FRAZIER 10FR DISP (MISCELLANEOUS) ×1 IMPLANT
SUT VIC AB 0 CT1 36 (SUTURE) ×3 IMPLANT
SUT VIC AB 1 CT1 36 (SUTURE) ×6 IMPLANT
SUT VIC AB 2-0 CT2 27 (SUTURE) ×3 IMPLANT
SYR 20CC LL (SYRINGE) ×3 IMPLANT
SYR 30ML LL (SYRINGE) ×12 IMPLANT
TIP FAN IRRIG PULSAVAC PLUS (DISPOSABLE) ×1 IMPLANT
TOWEL OR 17X26 4PK STRL BLUE (TOWEL DISPOSABLE) ×3 IMPLANT
TOWER CARTRIDGE SMART MIX (DISPOSABLE) ×3 IMPLANT
TRAY FOLEY W/METER SILVER 16FR (SET/KITS/TRAYS/PACK) ×3 IMPLANT
WRAPON POLAR PAD KNEE (MISCELLANEOUS) ×3

## 2016-11-30 NOTE — Anesthesia Post-op Follow-up Note (Signed)
Anesthesia QCDR form completed.        

## 2016-11-30 NOTE — Progress Notes (Signed)
New Admit  Arrival Method: From PACU Mental Orientation: A&OX4 Assessment:  Lungs clear bilaterally. S1, S2 auscultated and regular. Continuous pulse ox applied. Full sensation and can wiggle toes bilaterally. Pedal pulses 3+ bilaterally. Skin: Knee incision Pain: Denies Safety Measures: Low bed, bed alarm on, yellow socks, yellow armband, call bell in reach. Admission: Complete 1A Orientation: Complete Family: At bedside, updated.  Pt tolerated solid food and tolerated dangle at bedside well. Bone foam is applied.

## 2016-11-30 NOTE — Anesthesia Preprocedure Evaluation (Signed)
Anesthesia Evaluation  Patient identified by MRN, date of birth, ID band Patient awake    Reviewed: Allergy & Precautions, H&P , NPO status , Patient's Chart, lab work & pertinent test results  History of Anesthesia Complications Negative for: history of anesthetic complications  Airway Mallampati: III  TM Distance: <3 FB Neck ROM: limited    Dental  (+) Poor Dentition, Chipped, Caps   Pulmonary neg pulmonary ROS, neg shortness of breath,    Pulmonary exam normal breath sounds clear to auscultation       Cardiovascular Exercise Tolerance: Good hypertension, (-) angina(-) Past MI and (-) DOE Normal cardiovascular exam Rhythm:regular Rate:Normal     Neuro/Psych negative neurological ROS  negative psych ROS   GI/Hepatic Neg liver ROS, GERD  Controlled and Medicated,  Endo/Other  diabetes, Type 2  Renal/GU      Musculoskeletal   Abdominal   Peds  Hematology negative hematology ROS (+)   Anesthesia Other Findings Past Medical History: No date: Achalasia No date: Barrett's esophagus with dysplasia No date: Diabetes mellitus without complication ([facility]) No date: GERD (gastroesophageal reflux disease) No date: Hyperlipidemia No date: Hypertension No date: MRSA infection     Comment: lung No date: Thyroid disease  Past Surgical History: No date: ABDOMINAL HYSTERECTOMY No date: achalasia No date: HIATAL HERNIA REPAIR No date: THYROID EXPLORATION  BMI    Body Mass Index:  26.57 kg/m      Reproductive/Obstetrics negative OB ROS                             Anesthesia Physical  Anesthesia Plan  ASA: III  Anesthesia Plan: Spinal   Post-op Pain Management:    Induction:   PONV Risk Score and Plan:   Airway Management Planned:   Additional Equipment:   Intra-op Plan:   Post-operative Plan:   Informed Consent: I have reviewed the patients History and Physical, chart, labs  and discussed the procedure including the risks, benefits and alternatives for the proposed anesthesia with the patient or authorized representative who has indicated his/her understanding and acceptance.   Dental Advisory Given  Plan Discussed with: Anesthesiologist, CRNA and Surgeon  Anesthesia Plan Comments: (Patient reports no problems with local anesthetics since trouble at the dentist many years ago.  She reports that she had "heart racing" with injection in her gums.  No rash, face swelling or other allergic symptom with that "reaction".  This sound like an intravascular injection of local anesthetic with epinephrine.  Patient consented for spinal with backup GA.  She voiced understanding.)        Anesthesia Quick Evaluation

## 2016-11-30 NOTE — Op Note (Signed)
OPERATIVE NOTE  DATE OF SURGERY:  11/30/2016  PATIENT NAME:  Deanna Bird   DOB: 1928/05/09  MRN: [id number]  PRE-OPERATIVE DIAGNOSIS: Degenerative arthrosis of the left knee, primary  POST-OPERATIVE DIAGNOSIS:  Same  PROCEDURE:  Left total knee arthroplasty using computer-assisted navigation  SURGEON:  Marciano Sequin. M.D.  ASSISTANT:  Vance Peper, PA (present and scrubbed throughout the case, critical for assistance with exposure, retraction, instrumentation, and closure)  ANESTHESIA: spinal  ESTIMATED BLOOD LOSS: 50 mL  FLUIDS REPLACED: 700 mL of crystalloid  TOURNIQUET TIME: 71 minutes  DRAINS: 2 medium Hemovac drains  SOFT TISSUE RELEASES: Anterior cruciate ligament, posterior cruciate ligament, deep medial collateral ligament, patellofemoral ligament  IMPLANTS UTILIZED: DePuy Attune size 3 posterior stabilized femoral component (cemented), size 4 rotating platform tibial component (cemented), 35 mm medialized dome patella (cemented), and a 7 mm stabilized rotating platform polyethylene insert.  INDICATIONS FOR SURGERY: Deanna Bird is a 83 y.o. year old female with a long history of progressive knee pain. X-rays demonstrated severe degenerative changes in tricompartmental fashion. The patient had not seen any significant improvement despite conservative nonsurgical intervention. After discussion of the risks and benefits of surgical intervention, the patient expressed understanding of the risks benefits and agree with plans for total knee arthroplasty.   The risks, benefits, and alternatives were discussed at length including but not limited to the risks of infection, bleeding, nerve injury, stiffness, blood clots, the need for revision surgery, cardiopulmonary complications, among others, and they were willing to proceed.  PROCEDURE IN DETAIL: The patient was brought into the operating room and, after adequate spinal anesthesia was achieved, a tourniquet was placed on  the patient's upper thigh. The patient's knee and leg were cleaned and prepped with alcohol and DuraPrep and draped in the usual sterile fashion. A "timeout" was performed as per usual protocol. The lower extremity was exsanguinated using an Esmarch, and the tourniquet was inflated to 300 mmHg. An anterior longitudinal incision was made followed by a standard mid vastus approach. The deep fibers of the medial collateral ligament were elevated in a subperiosteal fashion off of the medial flare of the tibia so as to maintain a continuous soft tissue sleeve. The patella was subluxed laterally and the patellofemoral ligament was incised. Inspection of the knee demonstrated severe degenerative changes with full-thickness loss of articular cartilage. Osteophytes were debrided using a rongeur. Anterior and posterior cruciate ligaments were excised. Two 4.0 mm Schanz pins were inserted in the femur and into the tibia for attachment of the array of trackers used for computer-assisted navigation. Hip center was identified using a circumduction technique. Distal landmarks were mapped using the computer. The distal femur and proximal tibia were mapped using the computer. The distal femoral cutting guide was positioned using computer-assisted navigation so as to achieve a 5 distal valgus cut. The femur was sized and it was felt that a size 3 femoral component was appropriate. A size 3 femoral cutting guide was positioned and the anterior cut was performed and verified using the computer. This was followed by completion of the posterior and chamfer cuts. Femoral cutting guide for the central box was then positioned in the center box cut was performed.  Attention was then directed to the proximal tibia. Medial and lateral menisci were excised. The extramedullary tibial cutting guide was positioned using computer-assisted navigation so as to achieve a 0 varus-valgus alignment and 3 posterior slope. The cut was performed and  verified using the computer. The proximal tibia  was sized and it was felt that a size 4 tibial tray was appropriate. Tibial and femoral trials were inserted followed by insertion of a 7 mm polyethylene insert. This allowed for excellent mediolateral soft tissue balancing both in flexion and in full extension. Finally, the patella was cut and prepared so as to accommodate a 35 mm medialized dome patella. A patella trial was placed and the knee was placed through a range of motion with excellent patellar tracking appreciated. The femoral trial was removed after debridement of posterior osteophytes. The central post-hole for the tibial component was reamed followed by insertion of a keel punch. Tibial trials were then removed. Cut surfaces of bone were irrigated with copious amounts of normal saline with antibiotic solution using pulsatile lavage and then suctioned dry. Polymethylmethacrylate cement with gentamicin was prepared in the usual fashion using a vacuum mixer. Cement was applied to the cut surface of the proximal tibia as well as along the undersurface of a size 4 rotating platform tibial component. Tibial component was positioned and impacted into place. Excess cement was removed using [employer] fast streamer. Cement was then applied to the cut surfaces of the femur as well as along the posterior flanges of the size 3 femoral component. The femoral component was positioned and impacted into place. Excess cement was removed using [employer] fast streamer. A 7 mm polyethylene trial was inserted and the knee was brought into full extension with steady axial compression applied. Finally, cement was applied to the backside of a 35 mm medialized dome patella and the patellar component was positioned and patellar clamp applied. Excess cement was removed using [employer] fast streamer. After adequate curing of the cement, the tourniquet was deflated after a total tourniquet time of 71 min minutes. Hemostasis was achieved using  electrocautery. The knee was irrigated with copious amounts of normal saline with antibiotic solution using pulsatile lavage and then suctioned dry. 20 mL of 1.3% Exparel and 60 mL of 0.25% Marcaine in 40 mL of normal saline was injected along the posterior capsule, medial and lateral gutters, and along the arthrotomy site. A 7 mm stabilized rotating platform polyethylene insert was inserted and the knee was placed through a range of motion with excellent mediolateral soft tissue balancing appreciated and excellent patellar tracking noted. 2 medium drains were placed in the wound bed and brought out through separate stab incisions. The medial parapatellar portion of the incision was reapproximated using interrupted sutures of #1 Vicryl. Subcutaneous tissue was approximated in layers using first #0 Vicryl followed #2-0 Vicryl. The skin was approximated with skin staples. A sterile dressing was applied.  The patient tolerated the procedure well and was transported to the recovery room in stable condition.    Tenesha Garza P. Holley Bouche., M.D.

## 2016-11-30 NOTE — Transfer of Care (Signed)
Immediate Anesthesia Transfer of Care Note  Patient: Deanna Bird  Procedure(s) Performed: COMPUTER ASSISTED TOTAL KNEE ARTHROPLASTY (Left Knee)  Patient Location: PACU  Anesthesia Type:Spinal  Level of Consciousness: drowsy and patient cooperative  Airway & Oxygen Therapy: Patient Spontanous Breathing and Patient connected to nasal cannula oxygen  Post-op Assessment: Report given to RN and Post -op Vital signs reviewed and stable  Post vital signs: Reviewed and stable  Last Vitals:  Vitals:   11/30/16 0940  BP: 139/63  Pulse: 68  Resp: 16  Temp: 36.6 C  SpO2: 100%    Last Pain:  Vitals:   11/30/16 0940  TempSrc: Temporal         Complications: No apparent anesthesia complications

## 2016-11-30 NOTE — Discharge Instructions (Signed)
°  Instructions after Total Knee Replacement ° ° Kaliyan Osbourn P. Arlyn Bumpus, Jr., M.D.    ° Dept. of Orthopaedics & Sports Medicine ° [facility] ° [address] ° [place], [place]  [address] ° Phone: [phone]   Fax: [phone] ° °  °DIET: °• Drink plenty of non-alcoholic fluids. °• Resume your normal diet. Include foods high in fiber. ° °ACTIVITY:  °• You may use crutches or a walker with weight-bearing as tolerated, unless instructed otherwise. °• You may be weaned off of the walker or crutches by your Physical Therapist.  °• Do NOT place pillows under the knee. Anything placed under the knee could limit your ability to straighten the knee.   °• Continue doing gentle exercises. Exercising will reduce the pain and swelling, increase motion, and prevent muscle weakness.   °• Please continue to use the TED compression stockings for 6 weeks. You may remove the stockings at night, but should reapply them in the morning. °• Do not drive or operate any equipment until instructed. ° °WOUND CARE:  °• Continue to use the PolarCare or ice packs periodically to reduce pain and swelling. °• You may bathe or shower after the staples are removed at the first office visit following surgery. ° °MEDICATIONS: °• You may resume your regular medications. °• Please take the pain medication as prescribed on the medication. °• Do not take pain medication on an empty stomach. °• You have been given a prescription for a blood thinner (Lovenox or Coumadin). Please take the medication as instructed. (NOTE: After completing a 2 week course of Lovenox, take one Enteric-coated aspirin once a day. This along with elevation will help reduce the possibility of phlebitis in your operated leg.) °• Do not drive or drink alcoholic beverages when taking pain medications. ° °CALL THE OFFICE FOR: °• Temperature above 101 degrees °• Excessive bleeding or drainage on the dressing. °• Excessive swelling, coldness, or paleness of the toes. °• Persistent  nausea and vomiting. ° °FOLLOW-UP:  °• You should have an appointment to return to the office in 10-14 days after surgery. °• Arrangements have been made for continuation of Physical Therapy (either home therapy or outpatient therapy). °  °

## 2016-11-30 NOTE — Anesthesia Procedure Notes (Signed)
Spinal  Patient location during procedure: OR Start time: 11/30/2016 11:37 AM End time: 11/30/2016 11:41 AM Staffing Performed: resident/CRNA  Preanesthetic Checklist Completed: patient identified, site marked, surgical consent, pre-op evaluation, timeout performed, IV checked, risks and benefits discussed and monitors and equipment checked Spinal Block Patient position: sitting Prep: ChloraPrep Patient monitoring: heart rate, continuous pulse ox, blood pressure and cardiac monitor Approach: midline Location: L4-5 Injection technique: single-shot Needle Needle type: Introducer and Pencan  Needle gauge: 24 G Needle length: 9 cm Additional Notes Negative paresthesia. Negative blood return. Positive free-flowing CSF. Expiration date of kit checked and confirmed. Patient tolerated procedure well, without complications.

## 2016-11-30 NOTE — OR Nursing (Signed)
Dr Marry Guan assessed area on right lower leg where patient hit it on her dishwasher.  Dr Kayleen Memos also made aware patient took last dose of beta blocker 11/29/16.  No new orders at this time.

## 2016-11-30 NOTE — H&P (Signed)
The patient has been re-examined, and the chart reviewed, and there have been no interval changes to the documented history and physical.    The risks, benefits, and alternatives have been discussed at length. The patient expressed understanding of the risks benefits and agreed with plans for surgical intervention.  Romir Klimowicz P. Kyvon Hu, Jr. M.D.    

## 2016-11-30 NOTE — OR Nursing (Signed)
Dr Kayleen Memos notified of patient's fsbs of 57 new orders received.

## 2016-12-01 ENCOUNTER — Encounter: Payer: Self-pay | Admitting: Orthopedic Surgery

## 2016-12-01 LAB — BASIC METABOLIC PANEL
ANION GAP: 5 (ref 5–15)
BUN: 28 mg/dL — AB (ref 6–20)
CALCIUM: 8.2 mg/dL — AB (ref 8.9–10.3)
CO2: 26 mmol/L (ref 22–32)
Chloride: 106 mmol/L (ref 101–111)
Creatinine, Ser: 1.24 mg/dL — ABNORMAL HIGH (ref 0.44–1.00)
GFR calc Af Amer: 44 mL/min — ABNORMAL LOW (ref >60)
GFR, EST NON AFRICAN AMERICAN: 38 mL/min — AB (ref >60)
Glucose, Bld: 173 mg/dL — ABNORMAL HIGH (ref 65–99)
POTASSIUM: 3.8 mmol/L (ref 3.5–5.1)
SODIUM: 137 mmol/L (ref 135–145)

## 2016-12-01 LAB — GLUCOSE, CAPILLARY
GLUCOSE-CAPILLARY: 122 mg/dL — AB (ref 65–99)
GLUCOSE-CAPILLARY: 72 mg/dL (ref 65–99)
Glucose-Capillary: 166 mg/dL — ABNORMAL HIGH (ref 65–99)
Glucose-Capillary: 114 mg/dL — ABNORMAL HIGH (ref 65–99)
Glucose-Capillary: 111 mg/dL — ABNORMAL HIGH (ref 65–99)
Glucose-Capillary: 162 mg/dL — ABNORMAL HIGH (ref 65–99)

## 2016-12-01 MED ORDER — ENOXAPARIN SODIUM 40 MG/0.4ML ~~LOC~~ SOLN: 40.0000 mg | SUBCUTANEOUS | Status: DC

## 2016-12-01 MED ORDER — ENOXAPARIN SODIUM 40 MG/0.4ML ~~LOC~~ SOLN: 40.0000 mg | SUBCUTANEOUS | 0 refills | Status: DC

## 2016-12-01 MED ORDER — OXYCODONE HCL 5 MG PO TABS: 5.0000 mg | ORAL_TABLET | ORAL | 0 refills | Status: DC | PRN

## 2016-12-01 MED ORDER — TRAMADOL HCL 50 MG PO TABS: 50.0000 mg | ORAL_TABLET | ORAL | 0 refills | Status: DC | PRN

## 2016-12-01 NOTE — Care Management Note (Signed)
Case Management Note  Patient Details  Name: Deanna Bird MRN: [id number] Date of Birth: 1928-02-21  Subjective/Objective:  Very familiar with patient from last visit. Extremely pleasant.  POD # 1 left TKA. Met with patient, her daughter and spouse at bedside to discuss discharge planning. Patient lives with her spouse and her daughter will be staying with her 24/7 for as long as she is needed. Patient has a walker and bedside commode. Would like [facility] for home health PT. Referral sent to [facility]. Pharmacy : [facility] [id number].Will call Lovenox prior to discharge.                    Action/Plan: [facility] for HHPT, No DME. Lovenox will be called prior to discharge.   Expected Discharge Date:  12/02/16               Expected Discharge Plan:  [place]  In-House Referral:     Discharge planning Services  CM Consult  Post Acute Care Choice:  Home Health Choice offered to:  Patient  DME Arranged:    DME Agency:     HH Arranged:  PT [place]:  [facility] (formerly [facility])  Status of Service:  In process, will continue to follow  If discussed at Long Length of Stay Meetings, dates discussed:    Additional Comments:  [name], RN 12/01/2016, 11:21 AM

## 2016-12-01 NOTE — Progress Notes (Signed)
OT Cancellation Note  Patient Details Name: Deanna Bird MRN: [id number] DOB: [birth year]   Cancelled Treatment:    Reason Eval/Treat Not Completed: Patient declined, no reason specified. Order received, chart reviewed. Upon attempt to evaluate, pt very politely declining this morning, stating CNA coming shortly to assist her to the bathroom and she "needed to sit a while". Politely requesting OT come back this afternoon, despite encouragement in OT's role. Will re-attempt this afternoon.  Jeni Salles, MPH, MS, OTR/L ascom [phone] 12/01/16, 11:05 AM

## 2016-12-01 NOTE — Progress Notes (Signed)
Inpatient Diabetes Program Recommendations  AACE/ADA: New Consensus Statement on Inpatient Glycemic Control (2015)  Target Ranges:  Prepandial:   less than 140 mg/dL      Peak postprandial:   less than 180 mg/dL (1-2 hours)      Critically ill patients:  140 - 180 mg/dL   Results for KERLINE, TRAHAN (MRN [id number]) as of 12/01/2016 11:38  Ref. Range 11/30/2016 09:43 11/30/2016 10:45 11/30/2016 14:44 11/30/2016 15:37 11/30/2016 16:53 11/30/2016 21:16 12/01/2016 03:48 12/01/2016 07:41  Glucose-Capillary Latest Ref Range: 65 - 99 mg/dL 57 (L) 103 (H) 64 (L) 138 (H) 96 305 (H) 166 (H) 114 (H)   Review of Glycemic Control  Diabetes history: DM2 Outpatient Diabetes medications: Amaryl 1 mg QAM Current orders for Inpatient glycemic control: Amaryl 1 mg QAM, Novolog 0-15 units TID with meals  Inpatient Diabetes Program Recommendations: Correction (SSI): Please consider decreasing Novolog correction to Senstive scale (0-9 units) and adding Novolog 0-5 units QHS for bedtime correction. Oral Agents: Noted hypoglycemia episodes on 11/30/16.  While inpatient, please discontinue Amaryl.  NOTE: Patient received one time dose of Decadron 5 mg at 13:10 on 11/30/16 which is likely cause of glucose of 305 mg/dl on 11/30/16. No insulin has been given since admitted. Noted patient received Amaryl 1 mg this morning.  Thanks, Barnie Alderman, RN, MSN, CDE Diabetes Coordinator Inpatient Diabetes Program [phone] (Team Pager from 8am to 5pm)

## 2016-12-01 NOTE — Progress Notes (Signed)
   Subjective: 1 Day Post-Op Procedure(s) (LRB): COMPUTER ASSISTED TOTAL KNEE ARTHROPLASTY (Left) Patient reports pain as moderate.   Patient is well, and has had no acute complaints or problems We will start therapy today.  Plan is to go Home after hospital stay. no nausea and no vomiting Patient denies any chest pains or shortness of breath. Objective: Vital signs in last 24 hours: Temp:  [97.4 F (36.3 C)-98 F (36.7 C)] 98 F (36.7 C) (11/27 0346) Pulse Rate:  [56-82] 56 (11/27 0346) Resp:  [11-18] 15 (11/27 0346) BP: (113-139)/(39-63) 120/39 (11/27 0346) SpO2:  [96 %-100 %] 98 % (11/27 0346) Weight:  [61.7 kg (136 lb)-65.9 kg (145 lb 4.8 oz)] 65.9 kg (145 lb 4.8 oz) (11/26 1615) Heels are non tender and elevated off the bed using rolled towels as well as bone foam under operative leg Intake/Output from previous day: 11/26 0701 - 11/27 0700 In: 3520 [P.O.:1200; I.V.:1680; IV Piggyback:640] Out: 1640 [Urine:1450; Drains:140; Blood:50] Intake/Output this shift: No intake/output data recorded.  Recent Labs    11/30/16 1001  HGB 12.2   Recent Labs    11/30/16 1001  HCT 36.0   Recent Labs    11/30/16 1001 12/01/16 0425  NA 144 137  K 4.0 3.8  CL  --  106  CO2  --  26  BUN  --  28*  CREATININE  --  1.24*  GLUCOSE 62* 173*  CALCIUM  --  8.2*   No results for input(s): LABPT, INR in the last 72 hours.  EXAM General - Patient is Alert, Appropriate and Oriented Extremity - Neurologically intact Neurovascular intact Sensation intact distally Intact pulses distally Dorsiflexion/Plantar flexion intact Compartment soft Dressing - dressing C/D/I Motor Function - intact, moving foot and toes well on exam.    Past Medical History:  Diagnosis Date  . Achalasia   . Barrett's esophagus with dysplasia   . Diabetes mellitus without complication ([place])   . GERD (gastroesophageal reflux disease)   . Hyperlipidemia   . Hypertension   . MRSA infection    lung  .  Thyroid disease     Assessment/Plan: 1 Day Post-Op Procedure(s) (LRB): COMPUTER ASSISTED TOTAL KNEE ARTHROPLASTY (Left) Active Problems:   S/P total knee arthroplasty  Estimated body mass index is 24.18 kg/m as calculated from the following:   Height as of this encounter: 5\' 5"  (1.651 m).   Weight as of this encounter: 65.9 kg (145 lb 4.8 oz). Advance diet Up with therapy D/C IV fluids Plan for discharge tomorrow Discharge home with home health  Labs: Were reviewed DVT Prophylaxis - Lovenox, Foot Pumps and TED hose Weight-Bearing as tolerated to left leg D/C O2 and Pulse OX and try on Room Air Begin working on bowel movement Encouraged oral intake  Jon R. Coburg Gamewell 12/01/2016, 7:25 AM

## 2016-12-01 NOTE — Anesthesia Postprocedure Evaluation (Signed)
Anesthesia Post Note  Patient: Deanna Bird  Procedure(s) Performed: COMPUTER ASSISTED TOTAL KNEE ARTHROPLASTY (Left Knee)  Patient location during evaluation: Nursing Unit Anesthesia Type: Spinal Level of consciousness: awake, awake and alert and oriented Pain management: pain level controlled Vital Signs Assessment: post-procedure vital signs reviewed and stable Respiratory status: spontaneous breathing, nonlabored ventilation and respiratory function stable Cardiovascular status: blood pressure returned to baseline Postop Assessment: no headache and no backache Anesthetic complications: no     Last Vitals:  Vitals:   12/01/16 0033 12/01/16 0346  BP: (!) 113/46 (!) 120/39  Pulse: 64 (!) 56  Resp: 18 15  Temp: 36.5 C 36.7 C  SpO2: 100% 98%    Last Pain:  Vitals:   12/01/16 0432  TempSrc:   PainSc: Asleep                 [name],  [name]

## 2016-12-01 NOTE — Telephone Encounter (Signed)
Called yesterday and spoke to [facility] about this. Did not need faxed back. Will scan.

## 2016-12-01 NOTE — Progress Notes (Signed)
OT Cancellation Note  Patient Details Name: Deanna Bird MRN: [id number] DOB: [birth year]   Cancelled Treatment:    Reason Eval/Treat Not Completed: Patient declined, no reason specified. On 2nd attempt, pt seated EOB, just started eating lunch. Several family members in the room. Will re-attempt at later date/time as pt is available and as schedule permits.  Jeni Salles, MPH, MS, OTR/L ascom [phone] 12/01/16, 1:11 PM

## 2016-12-01 NOTE — Evaluation (Signed)
Physical Therapy Evaluation Patient Details Name: Deanna Bird MRN: [id number] DOB: October 25, 1928 Today's Date: 12/01/2016   History of Present Illness  Deanna Bird is an 83 y.o. female who was admitted 11/30/2016 with a diagnosis of degenerative arthrosis left knee and went to the operating room on 11/30/2016 and underwent L TKA.  Clinical Impression  Pt presents to PT with deficits in strength, ROM, functional mobility and gait and would benefit from acute PT services to address objective findings.  Pt lives with husband in multilevel home with a total of 20 steps to get to bedroom.  Pt with history of R TKA in March of this year and was able to manage at home after discharge with assist from daughter and pt plans on doing the same for this surgery.    Follow Up Recommendations Home health PT    Equipment Recommendations  None recommended by PT(has needed)    Recommendations for Other Services OT consult     Precautions / Restrictions Precautions Precautions: Knee Restrictions LLE Weight Bearing: Weight bearing as tolerated      Mobility  Bed Mobility Overal bed mobility: Needs Assistance Bed Mobility: Supine to Sit     Supine to sit: Supervision;HOB elevated     General bed mobility comments: Rises easily on first attempt, exiting to L side (as she will go at home)  Transfers Overall transfer level: Needs assistance Equipment used: Rolling walker (2 wheeled) Transfers: Sit to/from Stand Sit to Stand: Supervision         General transfer comment: Sit<>stand with good recall from previous surgery of hand placment and positioning.  Ambulation/Gait Ambulation/Gait assistance: Supervision Ambulation Distance (Feet): 20 Feet Assistive device: Rolling walker (2 wheeled) Gait Pattern/deviations: Step-to pattern;Antalgic     General Gait Details: Small step-to steps with foot flat positioning, supervision assist.  Stairs            Wheelchair Mobility     Modified Rankin (Stroke Patients Only)       Balance Overall balance assessment: Needs assistance Sitting-balance support: Feet supported Sitting balance-Leahy Scale: Good     Standing balance support: Bilateral upper extremity supported;During functional activity Standing balance-Leahy Scale: Good            Pertinent Vitals/Pain Pain Assessment: No/denies pain    Home Living Family/patient expects to be discharged to:: Private residence Living Arrangements: Spouse/significant other Available Help at Discharge: Family(daughter will help for 2 weeks, 24/7) Type of Home: House Home Access: Level entry;Ramped entrance(lower level)     Home Layout: Multi-level Home Equipment: Walker - 2 wheels;Cane - single point;Shower seat;Toilet riser;Grab bars - tub/shower;Hand held shower head;Adaptive equipment Additional Comments: Pt has a ramp to enter her home.  Pt lives in a trilevel home.  8 steps up to living room (there is a bedroom on this floor), another 8 steps up to her bedroom. L railing on both stair cases.  There is a walk in shower on the main level.  There is a tub shower unit on the top floor where her bedroom/bathroom is.    Prior Function Level of Independence: Independent with assistive device(s)         Comments: Pt was ambulating with SPC when out of her house, has a small dog     Hand Dominance   Dominant Hand: Right    Extremity/Trunk Assessment   Upper Extremity Assessment Upper Extremity Assessment: Overall WFL for tasks assessed    Lower Extremity Assessment Lower Extremity Assessment: LLE deficits/detail  LLE Deficits / Details: Able to SLRx5 and AROM to  LLE: Unable to fully assess due to pain    Cervical / Trunk Assessment Cervical / Trunk Assessment: Normal  Communication   Communication: No difficulties  Cognition Arousal/Alertness: Awake/alert Behavior During Therapy: WFL for tasks assessed/performed Overall Cognitive Status: Within  Functional Limits for tasks assessed           General Comments General comments (skin integrity, edema, etc.): surgical dressing intact    Exercises Total Joint Exercises Ankle Circles/Pumps: AROM;Both;10 reps Quad Sets: AROM;Both;10 reps Heel Slides: AAROM;Left;10 reps;Supine Straight Leg Raises: AROM;Left;5 reps [place] Arc Quad: AROM;Left;10 reps   Assessment/Plan    PT Assessment Patient needs continued PT services  PT Problem List Decreased strength;Decreased range of motion;Decreased balance;Decreased mobility;Pain;Decreased activity tolerance       PT Treatment Interventions DME instruction;Gait training;Stair training;Functional mobility training;Therapeutic activities;Therapeutic exercise;Balance training;Patient/family education    PT Goals (Current goals can be found in the Care Plan section)  Acute Rehab PT Goals Patient Stated Goal: To go home tomorrow. PT Goal Formulation: With patient Time For Goal Achievement: 12/08/16 Potential to Achieve Goals: Good    Frequency BID   Barriers to discharge Inaccessible home environment 20 steps total to bedroom    Co-evaluation        AM-PAC PT "6 Clicks" Daily Activity  Outcome Measure Difficulty turning over in bed (including adjusting bedclothes, sheets and blankets)?: A Little Difficulty moving from lying on back to sitting on the side of the bed? : A Little Difficulty sitting down on and standing up from a chair with arms (e.g., wheelchair, bedside commode, etc,.)?: A Little Help needed moving to and from a bed to chair (including a wheelchair)?: A Little Help needed walking in hospital room?: A Little Help needed climbing 3-5 steps with a railing? : A Little 6 Click Score: 18    End of Session Equipment Utilized During Treatment: Gait belt Activity Tolerance: Patient tolerated treatment well Patient left: in chair;with call bell/phone within reach;with family/visitor present Nurse Communication: Mobility  status PT Visit Diagnosis: Difficulty in walking, not elsewhere classified (R26.2);Muscle weakness (generalized) (M62.81)    Time: 1004-1030 PT Time Calculation (min) (ACUTE ONLY): 26 min   Charges:   PT Evaluation $PT Eval Moderate Complexity: 1 Mod PT Treatments $Therapeutic Exercise: 8-22 mins   PT G Codes:   PT G-Codes **NOT FOR INPATIENT CLASS** Functional Assessment Tool Used: AM-PAC 6 Clicks Basic Mobility Functional Limitation: Mobility: Walking and moving around Mobility: Walking and Moving Around Current Status (Z6109): At least 40 percent but less than 60 percent impaired, limited or restricted Mobility: Walking and Moving Around Goal Status [id number]): At least 20 percent but less than 40 percent impaired, limited or restricted    [employer], PT 12/01/2016, 10:59 AM

## 2016-12-01 NOTE — Discharge Summary (Signed)
Physician Discharge Summary  Patient ID: Deanna Bird MRN: [id number] DOB/AGE: 14-Oct-1928 83 y.o.  Admit date: 11/30/2016 Discharge date: 12/02/2016  Admission Diagnoses:  primary osteoarthritis of left knee   Discharge Diagnoses: Patient Active Problem List   Diagnosis Date Noted  . Advanced care planning/counseling discussion 08/04/2016  . S/P total knee arthroplasty 04/01/2016  . Primary osteoarthritis of right knee 03/19/2016  . Hyperlipidemia 11/19/2014  . Diabetes mellitus without complication ([place]) 81/19/1478  . Nonspecific abnormal results of thyroid function study 07/18/2014  . Hypertension 07/18/2014    Past Medical History:  Diagnosis Date  . Achalasia   . Barrett's esophagus with dysplasia   . Diabetes mellitus without complication ([place])   . GERD (gastroesophageal reflux disease)   . Hyperlipidemia   . Hypertension   . MRSA infection    lung  . Thyroid disease      Transfusion: No transfusions on this admission   Consultants (if any):   Discharged Condition: Improved  Hospital Course: Deanna Bird is an 83 y.o. female who was admitted 11/30/2016 with a diagnosis of degenerative arthrosis left knee and went to the operating room on 11/30/2016 and underwent the above named procedures.    Surgeries:Procedure(s): COMPUTER ASSISTED TOTAL KNEE ARTHROPLASTY on 11/30/2016  PRE-OPERATIVE DIAGNOSIS: Degenerative arthrosis of the left knee, primary  POST-OPERATIVE DIAGNOSIS:  Same  PROCEDURE:  Left total knee arthroplasty using computer-assisted navigation  SURGEON:  Marciano Sequin. M.D.  ASSISTANT:  Vance Peper, PA (present and scrubbed throughout the case, critical for assistance with exposure, retraction, instrumentation, and closure)  ANESTHESIA: spinal  ESTIMATED BLOOD LOSS: 50 mL  FLUIDS REPLACED: 700 mL of crystalloid  TOURNIQUET TIME: 71 minutes  DRAINS: 2 medium Hemovac drains  SOFT TISSUE RELEASES: Anterior cruciate  ligament, posterior cruciate ligament, deep medial collateral ligament, patellofemoral ligament  IMPLANTS UTILIZED: DePuy Attune size 3 posterior stabilized femoral component (cemented), size 4 rotating platform tibial component (cemented), 35 mm medialized dome patella (cemented), and a 7 mm stabilized rotating platform polyethylene insert.  INDICATIONS FOR SURGERY: Deanna Bird is a 83 y.o. year old female with a long history of progressive knee pain. X-rays demonstrated severe degenerative changes in tricompartmental fashion. The patient had not seen any significant improvement despite conservative nonsurgical intervention. After discussion of the risks and benefits of surgical intervention, the patient expressed understanding of the risks benefits and agree with plans for total knee arthroplasty.   The risks, benefits, and alternatives were discussed at length including but not limited to the risks of infection, bleeding, nerve injury, stiffness, blood clots, the need for revision surgery, cardiopulmonary complications, among others, and they were willing to proceed.   Patient tolerated the surgery well. No complications .Patient was taken to PACU where she was stabilized and then transferred to the orthopedic floor.  Patient started on Lovenox 30 mg q 12 hrs. Foot pumps applied bilaterally at 80 mm hgb. Heels elevated off bed with rolled towels. No evidence of DVT. Calves non tender. Negative Homan. Physical therapy started on day #1 for gait training and transfer with OT starting on  day #1 for ADL and assisted devices. Patient has done well with therapy. Ambulated greater than 200 feet feet upon being discharged. Was able to ascend and descend 4 steps safely and independently  Patient's IV And Foley were discontinued on day #1 with Hemovac being discontinued on day #2. Dressing was changed on day 2 prior to patient being discharged   She was given perioperative antibiotics:  Anti-infectives (From admission, onward)   Start     Dose/Rate Route Frequency Ordered Stop   11/30/16 1630  ceFAZolin (ANCEF) IVPB 2g/100 mL premix  Status:  Discontinued     2 g 200 mL/hr over 30 Minutes Intravenous Every 6 hours 11/30/16 1617 11/30/16 1622   11/30/16 1630  ceFAZolin (ANCEF) 2 g in dextrose 5 % 100 mL IVPB     2 g 240 mL/hr over 30 Minutes Intravenous Every 6 hours 11/30/16 1622 12/01/16 1629   11/30/16 0957  ceFAZolin (ANCEF) 2-4 GM/100ML-% IVPB    Comments:  [name]   : cabinet override      11/30/16 0957 11/30/16 2214   11/30/16 0600  ceFAZolin (ANCEF) IVPB 2g/100 mL premix  Status:  Discontinued     2 g 200 mL/hr over 30 Minutes Intravenous On call to O.R. 11/29/16 2209 11/30/16 1108    .  She was fitted with AV 1 compression foot pump devices, instructed on heel pumps, early ambulation, and fitted with TED stockings bilaterally for DVT prophylaxis.  She benefited maximally from the hospital stay and there were no complications.    Recent vital signs:  Vitals:   12/01/16 0033 12/01/16 0346  BP: (!) 113/46 (!) 120/39  Pulse: 64 (!) 56  Resp: 18 15  Temp: 97.7 F (36.5 C) 98 F (36.7 C)  SpO2: 100% 98%    Recent laboratory studies:  Lab Results  Component Value Date   HGB 12.2 11/30/2016   HGB 12.9 11/19/2016   HGB 11.4 08/04/2016   Lab Results  Component Value Date   WBC 7.7 11/19/2016   PLT 234 11/19/2016   Lab Results  Component Value Date   INR 0.93 11/19/2016   Lab Results  Component Value Date   NA 137 12/01/2016   K 3.8 12/01/2016   CL 106 12/01/2016   CO2 26 12/01/2016   BUN 28 (H) 12/01/2016   CREATININE 1.24 (H) 12/01/2016   GLUCOSE 173 (H) 12/01/2016    Discharge Medications:   Allergies as of 12/01/2016      Reactions   Procaine Palpitations, Other (See Comments)   Rapid heartbeat      Medication List    TAKE these medications   Alpha-Lipoic Acid 200 MG Tabs Take 200 mg by mouth 2 (two) times daily.    bisacodyl 5 MG EC tablet Generic drug:  bisacodyl Take 5 mg at bedtime by mouth.   docusate sodium 100 MG capsule Commonly known as:  COLACE Take 200 mg by mouth at bedtime.   enoxaparin 40 MG/0.4ML injection Commonly known as:  LOVENOX Inject 0.4 mLs (40 mg total) into the skin daily. Start taking on:  12/03/2016   Fenofibric Acid 105 MG Tabs Take 1 tablet (105 mg total) by mouth daily.   glimepiride 1 MG tablet Commonly known as:  AMARYL Take 1 tablet (1 mg total) by mouth daily with breakfast.   hydrocortisone 2.5 % rectal cream Commonly known as:  PROCTOZONE-HC Place 1 application rectally 2 (two) times daily. What changed:    when to take this  reasons to take this   losartan-hydrochlorothiazide 100-25 MG tablet Commonly known as:  HYZAAR Take 1 tablet by mouth daily.   Melatonin 1 MG Caps Take 1 capsule at bedtime as needed by mouth.   metoprolol succinate 100 MG 24 hr tablet Commonly known as:  TOPROL-XL Take 1 tablet (100 mg total) by mouth daily. Take with or immediately following a meal.   MULTIVITAMINS  PO Take 1 packet by mouth 2 (two) times daily. Forward Gold Daily Regimen (multivitamin/bone&balance/omega 3) By Dr. Lanell Matar   OVER THE COUNTER MEDICATION Take 1 capsule 2 (two) times daily by mouth. Omega Q Plus Resveratrol Supplement   OVER THE COUNTER MEDICATION Take 2 capsules 2 (two) times daily by mouth. Vision Essential Gold Supplement   OxyCODONE HCl (Abuse Deter) 5 MG Taba Commonly known as:  OXAYDO Take 1-2 tablets every 4 (four) hours as needed by mouth (1-2 tablets every 4 hours as needed). What changed:  Another medication with the same name was added. Make sure you understand how and when to take each.   oxyCODONE 5 MG immediate release tablet Commonly known as:  Oxy IR/ROXICODONE Take 1 tablet (5 mg total) by mouth every 4 (four) hours as needed for moderate pain ((score 4 to 6)). What changed:  You were already taking a medication  with the same name, and this prescription was added. Make sure you understand how and when to take each.   potassium chloride SA 20 MEQ tablet Commonly known as:  K-DUR,KLOR-CON Take 1 tablet (20 mEq total) by mouth daily.   PROBIOTIC PO Take 1 capsule by mouth daily before breakfast.   traMADol 50 MG tablet Commonly known as:  ULTRAM Take 1-2 tablets (50-100 mg total) by mouth every 4 (four) hours as needed for moderate pain.   Vitamin D-3 5000 units Tabs Take 5,000 Units daily with supper by mouth.            Durable Medical Equipment  (From admission, onward)        Start     Ordered   11/30/16 1617  DME Walker rolling  Once    Question:  Patient needs a walker to treat with the following condition  Answer:  Total knee replacement status   11/30/16 1617   11/30/16 1617  DME Bedside commode  Once    Question:  Patient needs a bedside commode to treat with the following condition  Answer:  Total knee replacement status   11/30/16 1617      Diagnostic Studies: Dg Knee Left Port  Result Date: 11/30/2016 CLINICAL DATA:  Knee replacement EXAM: PORTABLE LEFT KNEE - 1-2 VIEW COMPARISON:  None. FINDINGS: Total knee arthroplasty is anatomically aligned. There is no breakage or loosening of the hardware. No fracture. Surgical drain is present in the knee joint. IMPRESSION: Total knee arthroplasty anatomically aligned. Electronically Signed   By: Marybelle Killings M.D.   On: 11/30/2016 15:14    Disposition: 01-Home or Self Care  Discharge Instructions    Diet - low sodium heart healthy   Complete by:  As directed    Increase activity slowly   Complete by:  As directed       Follow-up Information    Watt Climes, PA On 12/15/2016.   Specialty:  Physician Assistant Why:  at 10:15am Contact information: [place] [address] [phone]        Dereck Leep, MD On 01/12/2017.   Specialty:  Orthopedic Surgery Why:  at  1:30pm Contact information: [place] [address] [phone]            Signed: Watt Climes 12/01/2016, 7:33 AM

## 2016-12-01 NOTE — Progress Notes (Signed)
Clinical Social Worker (CSW) received SNF consult. PT is recommending home health. RN case manager aware of above. Please reconsult if future social work needs arise. CSW signing off.   Jordane Hisle, LCSW [phone] 

## 2016-12-01 NOTE — Progress Notes (Signed)
Physical Therapy Treatment Patient Details Name: Deanna Bird MRN: [id number] DOB: 06-Nov-1928 Today's Date: 12/01/2016    History of Present Illness Deanna Bird is an 83 y.o. female who was admitted 11/30/2016 with a diagnosis of degenerative arthrosis left knee and went to the operating room on 11/30/2016 and underwent L TKA.    PT Comments    Pt with more pain in afternoon, deferring on initial attempt due to 10/10 pain.  Returned and pt with improved pain control and participated with ambulation in hallway and seated therex including knee stretching to ~70 degrees.   Cont with current POC.   Follow Up Recommendations  Home health PT     Equipment Recommendations  None recommended by PT    Recommendations for Other Services OT consult     Precautions / Restrictions Precautions Precautions: Knee Restrictions LLE Weight Bearing: Weight bearing as tolerated    Mobility  Bed Mobility Overal bed mobility: Needs Assistance Bed Mobility: Supine to Sit     Supine to sit: Supervision;HOB elevated        Transfers Overall transfer level: Needs assistance Equipment used: Rolling walker (2 wheeled) Transfers: Sit to/from Stand Sit to Stand: Supervision         General transfer comment: Good carry over from morning session, rising steadily.  Ambulation/Gait Ambulation/Gait assistance: Supervision Ambulation Distance (Feet): 80 Feet Assistive device: Rolling walker (2 wheeled) Gait Pattern/deviations: Step-to pattern;Antalgic     General Gait Details: Step-to gait progressing to short step through gait pattern, decreased weight acceptance on L side   Stairs            Wheelchair Mobility    Modified Rankin (Stroke Patients Only)       Balance                                            Cognition Arousal/Alertness: Awake/alert Behavior During Therapy: WFL for tasks assessed/performed Overall Cognitive Status: Within  Functional Limits for tasks assessed                                 General Comments: Reluctant to participate due to pain, but agreed with encouragement.      Exercises Total Joint Exercises Ankle Circles/Pumps: AROM;Both;10 reps Deanna Bird Arc Quad: AROM;Left;10 reps Knee Flexion: AAROM;Left;10 reps Goniometric ROM: 5-70    General Comments        Pertinent Vitals/Pain Pain Assessment: 0-10 Pain Score: 10-Worst pain ever((about half hour prior to therapy)) Pain Location: L knee Pain Descriptors / Indicators: Aching Pain Intervention(s): Limited activity within patient's tolerance;Premedicated before session    Home Living                      Prior Function            PT Goals (current goals can now be found in the care plan section) Acute Rehab PT Goals Patient Stated Goal: To go home tomorrow. PT Goal Formulation: With patient Time For Goal Achievement: 12/08/16 Potential to Achieve Goals: Good    Frequency    BID      PT Plan Current plan remains appropriate    Co-evaluation              AM-PAC PT "6 Clicks" Daily Activity  Outcome Measure  End of Session Equipment Utilized During Treatment: Gait belt Activity Tolerance: Patient tolerated treatment well Patient left: in bed;with call bell/phone within reach;with family/visitor present Nurse Communication: Mobility status PT Visit Diagnosis: Difficulty in walking, not elsewhere classified (R26.2);Muscle weakness (generalized) (M62.81)     Time: 7493-5521 PT Time Calculation (min) (ACUTE ONLY): 24 min  Charges:  $Gait Training: 8-22 mins $Therapeutic Exercise: 8-22 mins                    G Codes:       Deanna Bird A Deanna Bird , PT 12/01/2016, 5:39 PM

## 2016-12-02 LAB — BASIC METABOLIC PANEL
Anion gap: 6 (ref 5–15)
BUN: 26 mg/dL — AB (ref 6–20)
CHLORIDE: 104 mmol/L (ref 101–111)
CO2: 26 mmol/L (ref 22–32)
Calcium: 8.6 mg/dL — ABNORMAL LOW (ref 8.9–10.3)
Creatinine, Ser: 1.21 mg/dL — ABNORMAL HIGH (ref 0.44–1.00)
GFR, EST AFRICAN AMERICAN: 46 mL/min — AB (ref >60)
GFR, EST NON AFRICAN AMERICAN: 39 mL/min — AB (ref >60)
Glucose, Bld: 126 mg/dL — ABNORMAL HIGH (ref 65–99)
POTASSIUM: 3.9 mmol/L (ref 3.5–5.1)
SODIUM: 136 mmol/L (ref 135–145)

## 2016-12-02 LAB — GLUCOSE, CAPILLARY
GLUCOSE-CAPILLARY: 92 mg/dL (ref 65–99)
GLUCOSE-CAPILLARY: 96 mg/dL (ref 65–99)
GLUCOSE-CAPILLARY: 113 mg/dL — AB (ref 65–99)
Glucose-Capillary: 100 mg/dL — ABNORMAL HIGH (ref 65–99)
Glucose-Capillary: 165 mg/dL — ABNORMAL HIGH (ref 65–99)

## 2016-12-02 NOTE — Progress Notes (Signed)
Physical Therapy Treatment Patient Details Name: Deanna Bird MRN: [id number] DOB: 06-Oct-1928 Today's Date: 12/02/2016    History of Present Illness Deanna Bird is an 83 y.o. female who was admitted 11/30/2016 with a diagnosis of degenerative arthrosis left knee and went to the operating room on 11/30/2016 and underwent L TKA.    PT Comments    Does increase gait distance this session, but remains limited by pain (7-8/10).  Good effort to participate as able despite reported pain.  Review of chart indicates patient is due medicine, and may be off schedule (which would attribute to increase in pain levels); family slightly frustrated.  RN and English as a second language teacher of unit informed/aware.  Reviewed mobility goals for discharge (I.e., around nursing station and stairs) prior to discharge.  Patient/family generally disinterested in option of STR and strongly prefer to take patient home.  Report first floor bedroom set up "if she has to".   Follow Up Recommendations  Home health PT     Equipment Recommendations  None recommended by PT    Recommendations for Other Services       Precautions / Restrictions Precautions Precautions: Knee Restrictions Weight Bearing Restrictions: Yes LLE Weight Bearing: Weight bearing as tolerated    Mobility  Bed Mobility Overal bed mobility: Modified Independent Bed Mobility: Supine to Sit;Sit to Supine     Supine to sit: Modified independent (Device/Increase time) Sit to supine: Modified independent (Device/Increase time)   General bed mobility comments: seated edge of bed upon arrival to room; up in recliner end of session  Transfers Overall transfer level: Needs assistance Equipment used: Rolling walker (2 wheeled) Transfers: Sit to/from Stand Sit to Stand: Min guard;Supervision         General transfer comment: decreased active use of L LE  Ambulation/Gait Ambulation/Gait assistance: Min guard Ambulation Distance (Feet): 140  Feet Assistive device: Rolling walker (2 wheeled)     Gait velocity interpretation: Below normal speed for age/gender General Gait Details: mixed step to/step through gait pattern wtih decreased stance time/weight acceptance L LE; mild L hip weakness noted (slightly antalgic in stance phase).  Slow and guarded; distance self-limited due to pain/fatigue.   Stairs            Wheelchair Mobility    Modified Rankin (Stroke Patients Only)       Balance Overall balance assessment: Needs assistance Sitting-balance support: No upper extremity supported;Feet supported Sitting balance-Leahy Scale: Good     Standing balance support: Bilateral upper extremity supported Standing balance-Leahy Scale: Good                              Cognition Arousal/Alertness: Awake/alert Behavior During Therapy: WFL for tasks assessed/performed Overall Cognitive Status: Within Functional Limits for tasks assessed                                        Exercises Total Joint Exercises Goniometric ROM: L knee: 5-86 degrees Other Exercises Other Exercises: Therex deferred due to pain Other Exercises: Toilet transfer x2, ambulatory with RW, cga/close sup; sit/stand from [facility] with RW, cga/close sup.  Standing balance at sink for hand hygiene, close sup; min cuing for walker position/safety.    General Comments        Pertinent Vitals/Pain Pain Assessment: 0-10 Pain Score: 8  Pain Location: L knee Pain Descriptors / Indicators:  Aching Pain Intervention(s): Limited activity within patient's tolerance;Monitored during session;Repositioned    Home Living                      Prior Function            PT Goals (current goals can now be found in the care plan section) Acute Rehab PT Goals Patient Stated Goal: go home PT Goal Formulation: With patient Time For Goal Achievement: 12/08/16 Potential to Achieve Goals: Good Progress towards PT goals:  Progressing toward goals    Frequency    BID      PT Plan Current plan remains appropriate    Co-evaluation              AM-PAC PT "6 Clicks" Daily Activity  Outcome Measure  Difficulty turning over in bed (including adjusting bedclothes, sheets and blankets)?: A Little Difficulty moving from lying on back to sitting on the side of the bed? : A Little Difficulty sitting down on and standing up from a chair with arms (e.g., wheelchair, bedside commode, etc,.)?: Unable Help needed moving to and from a bed to chair (including a wheelchair)?: A Little Help needed walking in hospital room?: A Little Help needed climbing 3-5 steps with a railing? : A Lot 6 Click Score: 15    End of Session Equipment Utilized During Treatment: Gait belt Activity Tolerance: Patient tolerated treatment well Patient left: with call bell/phone within reach;with family/visitor present;in bed;with bed alarm set Nurse Communication: Mobility status PT Visit Diagnosis: Difficulty in walking, not elsewhere classified (R26.2);Muscle weakness (generalized) (M62.81)     Time: 7096-2836 PT Time Calculation (min) (ACUTE ONLY): 39 min  Charges:  $Gait Training: 8-22 mins $Therapeutic Exercise: 8-22 mins $Therapeutic Activity: 23-37 mins                    G Codes:       Cyndi Montejano H. Owens Shark, PT, DPT, NCS 12/02/16, 4:26 PM [phone]

## 2016-12-02 NOTE — Evaluation (Signed)
Physical Therapy Evaluation Patient Details Name: Deanna Bird MRN: [id number] DOB: 11-Aug-1928 Today's Date: 12/02/2016   History of Present Illness  Deanna Bird is an 83 y.o. female who was admitted 11/30/2016 with a diagnosis of degenerative arthrosis left knee and went to the operating room on 11/30/2016 and underwent L TKA.  Clinical Impression  Patient continues to complete all mobility tasks with RW, no greater than cga/close sup.  Gait distance self-limited by reports of pain and fatigue.  Slow and guarded, min cuing for L TKE in loading; min facilitation at L hip for stability.  Plan to increase gait distance this PM.    Follow Up Recommendations Home health PT    Equipment Recommendations  None recommended by PT    Recommendations for Other Services       Precautions / Restrictions Precautions Precautions: Knee Restrictions Weight Bearing Restrictions: Yes LLE Weight Bearing: Weight bearing as tolerated      Mobility  Bed Mobility               General bed mobility comments: seated edge of bed upon arrival to room; up in recliner end of session  Transfers Overall transfer level: Needs assistance Equipment used: Rolling walker (2 wheeled) Transfers: Sit to/from Stand Sit to Stand: Supervision         General transfer comment: increased time, heavy use of UEs to lift off and overall movement transition  Ambulation/Gait Ambulation/Gait assistance: Min guard;Supervision Ambulation Distance (Feet): 50 Feet Assistive device: Rolling walker (2 wheeled)     Gait velocity interpretation: Below normal speed for age/gender General Gait Details: mixed step to/step through gait pattern wtih decreased stance time/weight acceptance L LE; mild L hip weakness noted (slightly antalgic in stance phase).  Slow and guarded; distance self-limited due to pain/fatigue.  Stairs            Wheelchair Mobility    Modified Rankin (Stroke Patients Only)        Balance Overall balance assessment: Needs assistance Sitting-balance support: No upper extremity supported;Feet supported Sitting balance-Leahy Scale: Good     Standing balance support: Bilateral upper extremity supported Standing balance-Leahy Scale: Good                               Pertinent Vitals/Pain Pain Assessment: 0-10 Pain Score: 5  Pain Location: L knee Pain Descriptors / Indicators: Aching Pain Intervention(s): Limited activity within patient's tolerance;Monitored during session;Repositioned;Premedicated before session    Home Living                        Prior Function                 Hand Dominance        Extremity/Trunk Assessment                Communication      Cognition Arousal/Alertness: Awake/alert Behavior During Therapy: WFL for tasks assessed/performed Overall Cognitive Status: Within Functional Limits for tasks assessed                                        General Comments      Exercises Total Joint Exercises Goniometric ROM: L knee: 5-86 degrees   Assessment/Plan    PT Assessment Patient needs continued PT services  PT Problem List  Decreased strength;Decreased range of motion;Decreased balance;Decreased mobility;Pain;Decreased activity tolerance       PT Treatment Interventions DME instruction;Gait training;Stair training;Functional mobility training;Therapeutic activities;Therapeutic exercise;Balance training;Patient/family education    PT Goals (Current goals can be found in the Care Plan section)  Acute Rehab PT Goals Patient Stated Goal: go home PT Goal Formulation: With patient Time For Goal Achievement: 12/08/16 Potential to Achieve Goals: Good    Frequency BID   Barriers to discharge Inaccessible home environment      Co-evaluation               AM-PAC PT "6 Clicks" Daily Activity  Outcome Measure Difficulty turning over in bed (including adjusting  bedclothes, sheets and blankets)?: A Little Difficulty moving from lying on back to sitting on the side of the bed? : A Little Difficulty sitting down on and standing up from a chair with arms (e.g., wheelchair, bedside commode, etc,.)?: Unable Help needed moving to and from a bed to chair (including a wheelchair)?: A Little Help needed walking in hospital room?: A Little Help needed climbing 3-5 steps with a railing? : A Lot 6 Click Score: 15    End of Session Equipment Utilized During Treatment: Gait belt Activity Tolerance: Patient tolerated treatment well Patient left: with call bell/phone within reach;with family/visitor present;in chair;with chair alarm set Nurse Communication: Mobility status PT Visit Diagnosis: Difficulty in walking, not elsewhere classified (R26.2);Muscle weakness (generalized) (M62.81)    Time: 5093-2671 PT Time Calculation (min) (ACUTE ONLY): 25 min   Charges:     PT Treatments $Gait Training: 8-22 mins $Therapeutic Exercise: 8-22 mins   PT G Codes:       Deanna Bird, PT, DPT, NCS 12/02/16, 2:17 PM [phone]

## 2016-12-02 NOTE — Progress Notes (Signed)
   Subjective: 2 Days Post-Op Procedure(s) (LRB): COMPUTER ASSISTED TOTAL KNEE ARTHROPLASTY (Left) Patient reports pain as moderate.   Patient is well, and has had no acute complaints or problems Patient did well with therapy yesterday. Doesn't feel like she can go home today. Having a lot more increased soreness and discomfort that she did. Plan is to go Home after hospital stay. no nausea and no vomiting Patient denies any chest pains or shortness of breath. Objective: Vital signs in last 24 hours: Temp:  [97.7 F (36.5 C)-98.3 F (36.8 C)] 97.8 F (36.6 C) (11/27 1919) Pulse Rate:  [58-84] 84 (11/27 1919) Resp:  [18-20] 19 (11/27 1919) BP: (107-136)/(39-46) 130/39 (11/27 1919) SpO2:  [99 %-100 %] 99 % (11/27 1919) well approximated incision Heels are non tender and elevated off the bed using rolled towels Intake/Output from previous day: 11/27 0701 - 11/28 0700 In: 3516.7 [P.O.:960; I.V.:2556.7] Out: 350 [Urine:50; Drains:300] Intake/Output this shift: Total I/O In: 2556.7 [I.V.:2556.7] Out: 350 [Urine:50; Drains:300]  Recent Labs    11/30/16 1001  HGB 12.2   Recent Labs    11/30/16 1001  HCT 36.0   Recent Labs    12/01/16 0425 12/02/16 0523  NA 137 136  K 3.8 3.9  CL 106 104  CO2 26 26  BUN 28* 26*  CREATININE 1.24* 1.21*  GLUCOSE 173* 126*  CALCIUM 8.2* 8.6*   No results for input(s): LABPT, INR in the last 72 hours.  EXAM General - Patient is Alert, Appropriate and Oriented Extremity - Neurologically intact Neurovascular intact Sensation intact distally Intact pulses distally Dorsiflexion/Plantar flexion intact No cellulitis present Compartment soft Dressing - scant drainage Motor Function - intact, moving foot and toes well on exam.    Past Medical History:  Diagnosis Date  . Achalasia   . Barrett's esophagus with dysplasia   . Diabetes mellitus without complication ([place])   . GERD (gastroesophageal reflux disease)   . Hyperlipidemia    . Hypertension   . MRSA infection    lung  . Thyroid disease     Assessment/Plan: 2 Days Post-Op Procedure(s) (LRB): COMPUTER ASSISTED TOTAL KNEE ARTHROPLASTY (Left) Active Problems:   S/P total knee arthroplasty  Estimated body mass index is 24.18 kg/m as calculated from the following:   Height as of this encounter: 5\' 5"  (1.651 m).   Weight as of this encounter: 65.9 kg (145 lb 4.8 oz). Up with therapy Discharge home with home health. We'll try to discharge today but may be tomorrow.  Labs: Reviewed and acceptable DVT Prophylaxis - Lovenox, Foot Pumps and TED hose Weight-Bearing as tolerated to left leg Patient needs to have a bowel movement Hemovac discontinued on today's visit Continue physical therapy We'll check back later today to see if patient is able to go home at which time will discharge patient  Jillyn Ledger. [facility] 12/02/2016, 6:56 AM

## 2016-12-02 NOTE — Care Management Note (Signed)
Case Management Note  Patient Details  Name: Deanna Bird MRN: [id number] Date of Birth: [birth year]  Subjective/Objective: OT added to discharge plan                   Action/Plan:   Expected Discharge Date:  12/02/16               Expected Discharge Plan:  [place]  In-House Referral:     Discharge planning Services  CM Consult  Post Acute Care Choice:  Home Health Choice offered to:  Patient  DME Arranged:    DME Agency:     HH Arranged:  PT, OT [place] Agency:  [facility] (formerly [facility])  Status of Service:  In process, will continue to follow  If discussed at Long Length of Stay Meetings, dates discussed:    Additional Comments:  [name], RN 12/02/2016, 2:40 PM

## 2016-12-02 NOTE — Evaluation (Signed)
Occupational Therapy Evaluation Patient Details Name: Deanna Bird MRN: [id number] DOB: 18-Sep-1928 Today's Date: 12/02/2016    History of Present Illness Deanna Bird is an 83 y.o. female who was admitted 11/30/2016 with a diagnosis of degenerative arthrosis left knee and went to the operating room on 11/30/2016 and underwent L TKA.   Clinical Impression   Pt seen for OT evaluation this date, POD#1 for L TKA. Pt with 4/10 pain, decreased strength, ROM, activity tolerance, and decreased ability to perform self care tasks, requiring min assist for LB ADL tasks. Pt educated in AE/DME, polar care mgt, pet care mgt, and falls prevention strategies. Pt verbalized understanding of all education/training provided. Pt would benefit from skilled OT services to address noted impairments and functional deficits in order to maximize return to PLOF. Recommend HHOT services upon discharge.    Follow Up Recommendations  Home health OT    Equipment Recommendations  None recommended by OT    Recommendations for Other Services       Precautions / Restrictions Precautions Precautions: Knee Restrictions Weight Bearing Restrictions: Yes LLE Weight Bearing: Weight bearing as tolerated      Mobility Bed Mobility Overal bed mobility: Needs Assistance Bed Mobility: Supine to Sit     Supine to sit: Supervision;HOB elevated     General bed mobility comments: no assist required  Transfers Overall transfer level: Needs assistance Equipment used: Rolling walker (2 wheeled) Transfers: Sit to/from Stand Sit to Stand: Supervision              Balance Overall balance assessment: Needs assistance Sitting-balance support: Feet supported Sitting balance-Leahy Scale: Good     Standing balance support: Bilateral upper extremity supported;During functional activity Standing balance-Leahy Scale: Good                             ADL either performed or assessed with clinical  judgement   ADL Overall ADL's : Needs assistance/impaired Eating/Feeding: Independent   Grooming: Independent   Upper Body Bathing: Modified independent   Lower Body Bathing: Minimal assistance;Sit to/from stand   Upper Body Dressing : Modified independent   Lower Body Dressing: Minimal assistance Lower Body Dressing Details (indicate cue type and reason): Pt educated in use of AE for LB dressing to maximize independence Toilet Transfer: Supervision/safety;RW;Comfort height toilet;Ambulation           Functional mobility during ADLs: Supervision/safety;Rolling walker       Vision Baseline Vision/History: Wears glasses Wears Glasses: Distance only(driving) Patient Visual Report: No change from baseline       Perception     Praxis      Pertinent Vitals/Pain Pain Assessment: 0-10 Pain Score: 4  Pain Location: L knee Pain Descriptors / Indicators: Aching Pain Intervention(s): Limited activity within patient's tolerance;Monitored during session;Premedicated before session     Hand Dominance Right   Extremity/Trunk Assessment Upper Extremity Assessment Upper Extremity Assessment: Overall WFL for tasks assessed   Lower Extremity Assessment Lower Extremity Assessment: LLE deficits/detail;Defer to PT evaluation   Cervical / Trunk Assessment Cervical / Trunk Assessment: Normal   Communication Communication Communication: No difficulties   Cognition Arousal/Alertness: Awake/alert Behavior During Therapy: WFL for tasks assessed/performed Overall Cognitive Status: Within Functional Limits for tasks assessed                                     General  Comments       Exercises Other Exercises Other Exercises: Pt educated in polar care mgt and pet care strategies to minimize falls risk and maximize functional independence.   Shoulder Instructions      Home Living Family/patient expects to be discharged to:: Private residence Living  Arrangements: Spouse/significant other Available Help at Discharge: Family(daughter will help 24/7 for 2 weeks) Type of Home: House Home Access: Level entry;Ramped entrance     Home Layout: Multi-level Alternate Level Stairs-Number of Steps: tri-level home 20 total  Alternate Level Stairs-Rails: Left Bathroom Shower/Tub: Occupational psychologist: Handicapped height Bathroom Accessibility: Yes How Accessible: Accessible via walker Home Equipment: [place] - 2 wheels;Cane - single point;Shower seat;Toilet riser;Grab bars - tub/shower;Hand held shower head;Adaptive equipment Adaptive Equipment: Reacher Additional Comments: Pt has a ramp to enter her home.  Pt lives in a trilevel home.  8 steps up to living room (there is a bedroom on this floor), another 8 steps up to her bedroom. L railing on both stair cases.  There is a walk in shower on the main level.  There is a tub shower unit on the top floor where her bedroom/bathroom is.      Prior Functioning/Environment Level of Independence: Independent with assistive device(s)        Comments: Pt was ambulating with SPC when out of her house, has a small dog. Indep with ADL, driving, no falls.        OT Problem List: Decreased strength;Decreased range of motion;Decreased knowledge of use of DME or AE;Decreased activity tolerance;Pain;Impaired balance (sitting and/or standing)      OT Treatment/Interventions: Self-care/ADL training;DME and/or AE instruction;Patient/family education    OT Goals(Current goals can be found in the care plan section) Acute Rehab OT Goals Patient Stated Goal: go home OT Goal Formulation: With patient Time For Goal Achievement: 12/09/16 Potential to Achieve Goals: Good ADL Goals Pt Will Perform Lower Body Dressing: with modified independence;sit to/from stand;with adaptive equipment  OT Frequency: Min 1X/week   Barriers to D/C:            Co-evaluation              AM-PAC PT "6 Clicks"  Daily Activity     Outcome Measure Help from another person eating meals?: None Help from another person taking care of personal grooming?: None Help from another person toileting, which includes using toliet, bedpan, or urinal?: A Little Help from another person bathing (including washing, rinsing, drying)?: A Little Help from another person to put on and taking off regular upper body clothing?: None Help from another person to put on and taking off regular lower body clothing?: A Little 6 Click Score: 21   End of Session    Activity Tolerance: Patient tolerated treatment well Patient left: in bed;with call bell/phone within reach;with bed alarm set;with family/visitor present;Other (comment)(seated EOB, family, PT in room )  OT Visit Diagnosis: Other abnormalities of gait and mobility (R26.89);Pain Pain - Right/Left: Left Pain - part of body: Knee                Time: 1017-5102 OT Time Calculation (min): 12 min Charges:  OT General Charges $OT Visit: 1 Visit OT Evaluation $OT Eval Low Complexity: 1 Low OT Treatments $Self Care/Home Management : 8-22 mins G-Codes: OT G-codes **NOT FOR INPATIENT CLASS** Functional Assessment Tool Used: AM-PAC 6 Clicks Daily Activity;Clinical judgement Functional Limitation: Self care Self Care Current Status (H8527): At least 20 percent but less  than 40 percent impaired, limited or restricted Self Care Goal Status (D3570): At least 1 percent but less than 20 percent impaired, limited or restricted   Jeni Salles, MPH, MS, OTR/L ascom [phone] 12/02/16, 9:56 AM

## 2016-12-02 NOTE — Progress Notes (Signed)
POD 2. Vss. Pain controlled with PO pain meds. Bone foam in place throughout the night. IS at the bedside and pt encouraged to use it. Polarcare plugged up and full of ice. Pt. Has been ambulating up to the bathroom with each void. Neurochecks WDL. Will continue to monitor.

## 2016-12-03 LAB — GLUCOSE, CAPILLARY: Glucose-Capillary: 93 mg/dL (ref 65–99)

## 2016-12-03 NOTE — Progress Notes (Signed)
Physical Therapy Treatment Patient Details Name: Deanna Bird MRN: [id number] DOB: 02/20/1928 Today's Date: 12/03/2016    History of Present Illness Deanna Bird is an 83 y.o. female who was admitted 11/30/2016 with a diagnosis of degenerative arthrosis left knee and went to the operating room on 11/30/2016 and underwent L TKA.    PT Comments    Patient able to complete lap around nursing station and stairs with no greater than cga from therapist today.  Pain better controlled, but remains slow and guarded with all functional movement/activities.  Consistent cuing for L TKE in loading, though ROM progressing well.  Pain better controlled, allowing for improved participation/effort with entire session. Patient/daughter voice comfort with current functional level and level of assist required upon discharge.  No further questions/concerns at this time.   Follow Up Recommendations  Home health PT     Equipment Recommendations       Recommendations for Other Services       Precautions / Restrictions Precautions Precautions: Knee Restrictions Weight Bearing Restrictions: Yes LLE Weight Bearing: Weight bearing as tolerated    Mobility  Bed Mobility Overal bed mobility: Modified Independent                Transfers Overall transfer level: Needs assistance Equipment used: Rolling walker (2 wheeled) Transfers: Sit to/from Stand Sit to Stand: Supervision         General transfer comment: cuing for hand, foot placement and adequate forward trunk lean  Ambulation/Gait Ambulation/Gait assistance: Min guard Ambulation Distance (Feet): 220 Feet Assistive device: Rolling walker (2 wheeled)   Gait velocity: 10' walk time, 14 seconds   General Gait Details: step through gait pattern; decreased stance time L LE with decreased foot clearance, step height/length R LE; mildly antalgic due to L hip weakness; slow and guarded, but no buckling or LOB.  Min cuing throughout for L  TKE in loading.   Stairs Stairs: Yes   Stair Management: Two rails;One rail Left Number of Stairs: 8 General stair comments: initial 4 steps with bilat rails, additional 4 steps with bilat UEs on L ascending rail, cga throughout.  Step to gait pattern with min cuing for technique, min cuing/facilitation for L TKE in loading.  Wheelchair Mobility    Modified Rankin (Stroke Patients Only)       Balance Overall balance assessment: Needs assistance Sitting-balance support: No upper extremity supported;Feet supported Sitting balance-Leahy Scale: Normal     Standing balance support: Bilateral upper extremity supported Standing balance-Leahy Scale: Fair                              Cognition Arousal/Alertness: Awake/alert Behavior During Therapy: WFL for tasks assessed/performed Overall Cognitive Status: Within Functional Limits for tasks assessed                                        Exercises Total Joint Exercises Goniometric ROM: L knee: 3-100 degrees Other Exercises Other Exercises: Verbally reviewed technique for car transfers; patient/daughter voiced understandign.    General Comments        Pertinent Vitals/Pain Pain Assessment: 0-10 Pain Score: 6  Pain Location: L knee Pain Descriptors / Indicators: Aching Pain Intervention(s): Limited activity within patient's tolerance;Monitored during session;Premedicated before session;Repositioned    Home Living  Prior Function            PT Goals (current goals can now be found in the care plan section) Acute Rehab PT Goals Patient Stated Goal: go home PT Goal Formulation: With patient Time For Goal Achievement: 12/08/16 Potential to Achieve Goals: Good Progress towards PT goals: Progressing toward goals    Frequency    BID      PT Plan Current plan remains appropriate    Co-evaluation              AM-PAC PT "6 Clicks" Daily Activity   Outcome Measure  Difficulty turning over in bed (including adjusting bedclothes, sheets and blankets)?: None Difficulty moving from lying on back to sitting on the side of the bed? : None Difficulty sitting down on and standing up from a chair with arms (e.g., wheelchair, bedside commode, etc,.)?: A Little Help needed moving to and from a bed to chair (including a wheelchair)?: A Little Help needed walking in hospital room?: A Little Help needed climbing 3-5 steps with a railing? : A Little 6 Click Score: 20    End of Session Equipment Utilized During Treatment: Gait belt Activity Tolerance: Patient tolerated treatment well Patient left: with call bell/phone within reach;with family/visitor present;in bed;with bed alarm set Nurse Communication: Mobility status PT Visit Diagnosis: Difficulty in walking, not elsewhere classified (R26.2);Muscle weakness (generalized) (M62.81)     Time: 3559-7416 PT Time Calculation (min) (ACUTE ONLY): 33 min  Charges:  $Gait Training: 23-37 mins                    G Codes:       Elizabeth Paulsen H. Owens Shark, PT, DPT, NCS 12/03/16, 2:10 PM [phone]

## 2016-12-03 NOTE — Progress Notes (Signed)
Patient is being discharged. Lovenox reviewed and given with daughter at bedside. Scripts and last dose given was also reviewed. IV removed with cath intact.

## 2016-12-03 NOTE — Progress Notes (Signed)
   Subjective: 3 Days Post-Op Procedure(s) (LRB): COMPUTER ASSISTED TOTAL KNEE ARTHROPLASTY (Left) Patient reports pain as mild.   Patient is well, and has had no acute complaints or problems Did well with therapy yesterday but did not meet criteria for going home Plan is to go Home after hospital stay. no nausea and no vomiting Patient denies any chest pains or shortness of breath. Objective: Vital signs in last 24 hours: Temp:  [97.6 F (36.4 C)-99.2 F (37.3 C)] 98.6 F (37 C) (11/29 0453) Pulse Rate:  [78-89] 83 (11/29 0453) Resp:  [16-20] 17 (11/29 0453) BP: (123-157)/(40-66) 130/66 (11/29 0453) SpO2:  [97 %-100 %] 100 % (11/29 0453) well approximated incision Heels are non tender and elevated off the bed using rolled towels Intake/Output from previous day: 11/28 0701 - 11/29 0700 In: 600 [P.O.:600] Out: -  Intake/Output this shift: No intake/output data recorded.  Recent Labs    11/30/16 1001  HGB 12.2   Recent Labs    11/30/16 1001  HCT 36.0   Recent Labs    12/01/16 0425 12/02/16 0523  NA 137 136  K 3.8 3.9  CL 106 104  CO2 26 26  BUN 28* 26*  CREATININE 1.24* 1.21*  GLUCOSE 173* 126*  CALCIUM 8.2* 8.6*   No results for input(s): LABPT, INR in the last 72 hours.  EXAM General - Patient is Alert, Appropriate and Oriented Extremity - Neurologically intact Neurovascular intact Sensation intact distally Intact pulses distally Dorsiflexion/Plantar flexion intact No cellulitis present Compartment soft Dressing - dressing C/D/I Motor Function - intact, moving foot and toes well on exam.    Past Medical History:  Diagnosis Date  . Achalasia   . Barrett's esophagus with dysplasia   . Diabetes mellitus without complication ([place])   . GERD (gastroesophageal reflux disease)   . Hyperlipidemia   . Hypertension   . MRSA infection    lung  . Thyroid disease     Assessment/Plan: 3 Days Post-Op Procedure(s) (LRB): COMPUTER ASSISTED TOTAL KNEE  ARTHROPLASTY (Left) Active Problems:   S/P total knee arthroplasty  Estimated body mass index is 24.18 kg/m as calculated from the following:   Height as of this encounter: 5\' 5"  (1.651 m).   Weight as of this encounter: 65.9 kg (145 lb 4.8 oz). Up with therapy Discharge home with home health  Labs: None DVT Prophylaxis - Lovenox, Foot Pumps and TED hose Weight-Bearing as tolerated to left leg Patient may be discharged home once she does the lap around the nurse's desk and does steps. Please change dressing prior to patient being discharged and give the patient 2 extra honeycomb dressings to take home.  Jillyn Ledger. [place] 12/03/2016, 7:31 AM

## 2016-12-03 NOTE — Care Management Important Message (Signed)
Important Message  Patient Details  Name: HEBAH BOGOSIAN MRN: [id number] Date of Birth: [birth year]   Medicare Important Message Given:  Yes    [name], RN 12/03/2016, 9:37 AM

## 2016-12-03 NOTE — Care Management Note (Signed)
Case Management Note  Patient Details  Name: HILDAGARDE HOLLERAN MRN: [id number] Date of Birth: Sep 20, 1928  Subjective/Objective:  Discharging today                  Action/Plan: [facility] notiied of discharge and need for HHPT and [place]. Called Lovenox 40 mg # 14 no refills to [employer]. Cost is $ 85.00. Daughter updated and denies issue paying for medications  Expected Discharge Date:  12/03/16               Expected Discharge Plan:  [place]  In-House Referral:     Discharge planning Services  CM Consult  Post Acute Care Choice:  Home Health Choice offered to:  Patient  DME Arranged:    DME Agency:     HH Arranged:  PT, OT [place] Agency:  [facility] (formerly [employer])  Status of Service:  Completed, signed off  If discussed at [employer], dates discussed:    Additional Comments:  [name], RN 12/03/2016, 9:32 AM

## 2016-12-04 DIAGNOSIS — Z7984 Long term (current) use of oral hypoglycemic drugs: Secondary | ICD-10-CM | POA: Diagnosis not present

## 2016-12-04 DIAGNOSIS — Z471 Aftercare following joint replacement surgery: Secondary | ICD-10-CM | POA: Diagnosis not present

## 2016-12-04 DIAGNOSIS — M1711 Unilateral primary osteoarthritis, right knee: Secondary | ICD-10-CM | POA: Diagnosis not present

## 2016-12-04 DIAGNOSIS — E119 Type 2 diabetes mellitus without complications: Secondary | ICD-10-CM | POA: Diagnosis not present

## 2016-12-04 DIAGNOSIS — Z9181 History of falling: Secondary | ICD-10-CM | POA: Diagnosis not present

## 2016-12-04 DIAGNOSIS — Z96652 Presence of left artificial knee joint: Secondary | ICD-10-CM | POA: Diagnosis not present

## 2016-12-04 DIAGNOSIS — I10 Essential (primary) hypertension: Secondary | ICD-10-CM | POA: Diagnosis not present

## 2016-12-05 DIAGNOSIS — Z7984 Long term (current) use of oral hypoglycemic drugs: Secondary | ICD-10-CM | POA: Diagnosis not present

## 2016-12-05 DIAGNOSIS — E119 Type 2 diabetes mellitus without complications: Secondary | ICD-10-CM | POA: Diagnosis not present

## 2016-12-05 DIAGNOSIS — Z471 Aftercare following joint replacement surgery: Secondary | ICD-10-CM | POA: Diagnosis not present

## 2016-12-05 DIAGNOSIS — M1711 Unilateral primary osteoarthritis, right knee: Secondary | ICD-10-CM | POA: Diagnosis not present

## 2016-12-05 DIAGNOSIS — Z96652 Presence of left artificial knee joint: Secondary | ICD-10-CM | POA: Diagnosis not present

## 2016-12-05 DIAGNOSIS — Z9181 History of falling: Secondary | ICD-10-CM | POA: Diagnosis not present

## 2016-12-05 DIAGNOSIS — I10 Essential (primary) hypertension: Secondary | ICD-10-CM | POA: Diagnosis not present

## 2016-12-15 DIAGNOSIS — M25562 Pain in left knee: Secondary | ICD-10-CM | POA: Diagnosis not present

## 2016-12-17 DIAGNOSIS — M25562 Pain in left knee: Secondary | ICD-10-CM | POA: Diagnosis not present

## 2016-12-22 DIAGNOSIS — M25562 Pain in left knee: Secondary | ICD-10-CM | POA: Diagnosis not present

## 2016-12-24 DIAGNOSIS — M25562 Pain in left knee: Secondary | ICD-10-CM | POA: Diagnosis not present

## 2016-12-31 DIAGNOSIS — M25562 Pain in left knee: Secondary | ICD-10-CM | POA: Diagnosis not present

## 2017-01-04 DIAGNOSIS — M25562 Pain in left knee: Secondary | ICD-10-CM | POA: Diagnosis not present

## 2017-01-12 DIAGNOSIS — Z96652 Presence of left artificial knee joint: Secondary | ICD-10-CM | POA: Diagnosis not present

## 2017-01-14 DIAGNOSIS — M25562 Pain in left knee: Secondary | ICD-10-CM | POA: Diagnosis not present

## 2017-01-19 DIAGNOSIS — M25562 Pain in left knee: Secondary | ICD-10-CM | POA: Diagnosis not present

## 2017-01-22 DIAGNOSIS — M25562 Pain in left knee: Secondary | ICD-10-CM | POA: Diagnosis not present

## 2017-01-26 DIAGNOSIS — M25562 Pain in left knee: Secondary | ICD-10-CM | POA: Diagnosis not present

## 2017-02-02 DIAGNOSIS — M25562 Pain in left knee: Secondary | ICD-10-CM | POA: Diagnosis not present

## 2017-02-04 ENCOUNTER — Encounter: Payer: Self-pay | Admitting: Family Medicine

## 2017-02-04 ENCOUNTER — Ambulatory Visit (INDEPENDENT_AMBULATORY_CARE_PROVIDER_SITE_OTHER): Payer: PPO | Admitting: Family Medicine

## 2017-02-04 VITALS — BP 134/64 | HR 64 | Wt 141.0 lb

## 2017-02-04 DIAGNOSIS — E119 Type 2 diabetes mellitus without complications: Secondary | ICD-10-CM

## 2017-02-04 DIAGNOSIS — E782 Mixed hyperlipidemia: Secondary | ICD-10-CM

## 2017-02-04 DIAGNOSIS — I10 Essential (primary) hypertension: Secondary | ICD-10-CM

## 2017-02-04 DIAGNOSIS — M25562 Pain in left knee: Secondary | ICD-10-CM | POA: Diagnosis not present

## 2017-02-04 LAB — LP+ALT+AST PICCOLO, WAIVED
ALT (SGPT) Piccolo, Waived: 27 U/L (ref 10–47)
AST (SGOT) Piccolo, Waived: 34 U/L (ref 11–38)
Chol/HDL Ratio Piccolo,Waive: 5 mg/dL — ABNORMAL HIGH
Cholesterol Piccolo, Waived: 116 mg/dL (ref <200)
HDL CHOL PICCOLO, WAIVED: 23 mg/dL — AB (ref >59)
LDL CHOL CALC PICCOLO WAIVED: 64 mg/dL (ref <100)
TRIGLYCERIDES PICCOLO,WAIVED: 141 mg/dL (ref <150)
VLDL CHOL CALC PICCOLO,WAIVE: 28 mg/dL (ref <30)

## 2017-02-04 LAB — BAYER DCA HB A1C WAIVED: HB A1C (BAYER DCA - WAIVED): 4.9 % (ref <7.0)

## 2017-02-04 NOTE — Assessment & Plan Note (Signed)
The current medical regimen is effective;  continue present plan and medications.  

## 2017-02-04 NOTE — Assessment & Plan Note (Signed)
Patient's hemoglobin A1c in the nondiabetic range but they discussed decreasing Amaryl from 1 mg to half a milligram patient will break her current tablet in half.  Discussed weight loss diet nutrition and diabetes.

## 2017-02-04 NOTE — Progress Notes (Signed)
BP 134/64 (BP Location: Left Arm)   Pulse 64   Wt 141 lb (64 kg)   SpO2 99%   BMI 23.46 kg/m    Subjective:    Patient ID: Deanna Bird, female    DOB: 1928/12/05, 84 y.o.   MRN: [id number]  HPI: Deanna Bird is a 84 y.o. female  Chief Complaint  Patient presents with  . Follow-up  . Hypertension  . Diabetes   Patient follow-up doing well and has had knee replacement surgery at this fall and is doing well with good recovery. Blood sugars have remained good especially with patient's weight loss over this last several years hemoglobin A1c has dropped accordingly. Patient with no low blood sugar spells. Blood pressure doing well no complaints from medications Cholesterol also doing well along with triglycerides.  Relevant past medical, surgical, family and social history reviewed and updated as indicated. Interim medical history since our last visit reviewed. Allergies and medications reviewed and updated.  Review of Systems  Constitutional: Negative.   Respiratory: Negative.   Cardiovascular: Negative.     Per HPI unless specifically indicated above     Objective:    BP 134/64 (BP Location: Left Arm)   Pulse 64   Wt 141 lb (64 kg)   SpO2 99%   BMI 23.46 kg/m   Wt Readings from Last 3 Encounters:  02/04/17 141 lb (64 kg)  11/30/16 145 lb 4.8 oz (65.9 kg)  11/19/16 136 lb (61.7 kg)    Physical Exam  Constitutional: She is oriented to person, place, and time. She appears well-developed and well-nourished.  HENT:  Head: Normocephalic and atraumatic.  Eyes: Conjunctivae and EOM are normal.  Neck: Normal range of motion.  Cardiovascular: Normal rate, regular rhythm and normal heart sounds.  Pulmonary/Chest: Effort normal and breath sounds normal.  Musculoskeletal: Normal range of motion.  Neurological: She is alert and oriented to person, place, and time.  Skin: No erythema.  Psychiatric: She has a normal mood and affect. Her behavior is normal. Judgment  and thought content normal.    Results for orders placed or performed during the hospital encounter of 11/30/16  Glucose, capillary  Result Value Ref Range   Glucose-Capillary 57 (L) 65 - 99 mg/dL  Glucose, capillary  Result Value Ref Range   Glucose-Capillary 103 (H) 65 - 99 mg/dL  Glucose, capillary  Result Value Ref Range   Glucose-Capillary 64 (L) 65 - 99 mg/dL  Glucose, capillary  Result Value Ref Range   Glucose-Capillary 138 (H) 65 - 99 mg/dL  Basic metabolic panel  Result Value Ref Range   Sodium 137 135 - 145 mmol/L   Potassium 3.8 3.5 - 5.1 mmol/L   Chloride 106 101 - 111 mmol/L   CO2 26 22 - 32 mmol/L   Glucose, Bld 173 (H) 65 - 99 mg/dL   BUN 28 (H) 6 - 20 mg/dL   Creatinine, Ser 1.24 (H) 0.44 - 1.00 mg/dL   Calcium 8.2 (L) 8.9 - 10.3 mg/dL   GFR calc non Af Amer 38 (L) >60 mL/min   GFR calc Af Amer 44 (L) >60 mL/min   Anion gap 5 5 - 15  Glucose, capillary  Result Value Ref Range   Glucose-Capillary 96 65 - 99 mg/dL  Glucose, capillary  Result Value Ref Range   Glucose-Capillary 305 (H) 65 - 99 mg/dL   Comment 1 Notify RN   Glucose, capillary  Result Value Ref Range   Glucose-Capillary 166 (H) 65 -  99 mg/dL   Comment 1 Notify RN   Glucose, capillary  Result Value Ref Range   Glucose-Capillary 114 (H) 65 - 99 mg/dL  Glucose, capillary  Result Value Ref Range   Glucose-Capillary 122 (H) 65 - 99 mg/dL  Glucose, capillary  Result Value Ref Range   Glucose-Capillary 111 (H) 65 - 99 mg/dL  Basic metabolic panel  Result Value Ref Range   Sodium 136 135 - 145 mmol/L   Potassium 3.9 3.5 - 5.1 mmol/L   Chloride 104 101 - 111 mmol/L   CO2 26 22 - 32 mmol/L   Glucose, Bld 126 (H) 65 - 99 mg/dL   BUN 26 (H) 6 - 20 mg/dL   Creatinine, Ser 1.21 (H) 0.44 - 1.00 mg/dL   Calcium 8.6 (L) 8.9 - 10.3 mg/dL   GFR calc non Af Amer 39 (L) >60 mL/min   GFR calc Af Amer 46 (L) >60 mL/min   Anion gap 6 5 - 15  Glucose, capillary  Result Value Ref Range    Glucose-Capillary 72 65 - 99 mg/dL   Comment 1 Notify RN   Glucose, capillary  Result Value Ref Range   Glucose-Capillary 162 (H) 65 - 99 mg/dL  Glucose, capillary  Result Value Ref Range   Glucose-Capillary 92 65 - 99 mg/dL  Glucose, capillary  Result Value Ref Range   Glucose-Capillary 96 65 - 99 mg/dL  Glucose, capillary  Result Value Ref Range   Glucose-Capillary 100 (H) 65 - 99 mg/dL  Glucose, capillary  Result Value Ref Range   Glucose-Capillary 113 (H) 65 - 99 mg/dL  Glucose, capillary  Result Value Ref Range   Glucose-Capillary 165 (H) 65 - 99 mg/dL   Comment 1 Notify RN   Glucose, capillary  Result Value Ref Range   Glucose-Capillary 93 65 - 99 mg/dL  I-STAT 4, (NA,K, GLUC, HGB,HCT)  Result Value Ref Range   Sodium 144 135 - 145 mmol/L   Potassium 4.0 3.5 - 5.1 mmol/L   Glucose, Bld 62 (L) 65 - 99 mg/dL   HCT 36.0 36.0 - 46.0 %   Hemoglobin 12.2 12.0 - 15.0 g/dL      Assessment & Plan:   Problem List Items Addressed This Visit      Cardiovascular and Mediastinum   Hypertension - Primary    The current medical regimen is effective;  continue present plan and medications.       Relevant Orders   Bayer DCA Hb A1c Waived   Basic metabolic panel   LP+ALT+AST Piccolo, Waived     Endocrine   Diabetes mellitus without complication ([place])    Patient's hemoglobin A1c in the nondiabetic range but they discussed decreasing Amaryl from 1 mg to half a milligram patient will break her current tablet in half.  Discussed weight loss diet nutrition and diabetes.      Relevant Orders   Bayer DCA Hb A1c Waived   Basic metabolic panel   LP+ALT+AST Piccolo, Waived     Other   Hyperlipidemia    The current medical regimen is effective;  continue present plan and medications.       Relevant Orders   Bayer DCA Hb A1c Waived   Basic metabolic panel   LP+ALT+AST Piccolo, Waived       Follow up plan: Return in about 6 months (around 08/04/2017) for Physical Exam,  Hemoglobin A1c.

## 2017-02-05 LAB — BASIC METABOLIC PANEL
BUN/Creatinine Ratio: 23 (ref 12–28)
BUN: 30 mg/dL — AB (ref 8–27)
CALCIUM: 10.1 mg/dL (ref 8.7–10.3)
CO2: 27 mmol/L (ref 20–29)
CREATININE: 1.31 mg/dL — AB (ref 0.57–1.00)
Chloride: 104 mmol/L (ref 96–106)
GFR, EST AFRICAN AMERICAN: 43 mL/min/{1.73_m2} — AB (ref >59)
GFR, EST NON AFRICAN AMERICAN: 37 mL/min/{1.73_m2} — AB (ref >59)
Glucose: 75 mg/dL (ref 65–99)
Potassium: 4.4 mmol/L (ref 3.5–5.2)
Sodium: 146 mmol/L — ABNORMAL HIGH (ref 134–144)

## 2017-02-08 ENCOUNTER — Encounter: Payer: Self-pay | Admitting: Family Medicine

## 2017-02-09 DIAGNOSIS — M25562 Pain in left knee: Secondary | ICD-10-CM | POA: Diagnosis not present

## 2017-02-12 DIAGNOSIS — M25562 Pain in left knee: Secondary | ICD-10-CM | POA: Diagnosis not present

## 2017-02-15 DIAGNOSIS — Z471 Aftercare following joint replacement surgery: Secondary | ICD-10-CM | POA: Diagnosis not present

## 2017-02-17 ENCOUNTER — Ambulatory Visit: Payer: Self-pay | Admitting: *Deleted

## 2017-02-17 NOTE — Telephone Encounter (Signed)
Pt  Has  Had   A  Cough   For  About  1   Week     With a  Runny  Nose   She  Denies   Any fever    Or  Chills  .  She  Denies   Any  Shortness  Of breath or  Chest pain  Appointment  Made  With  Dr  Carole Binning  At  1000 am  Tomorrow ok by  Alwyn Ren    Reason for Disposition . [1] Patient also has allergy symptoms (e.g., itchy eyes, clear nasal discharge, postnasal drip) AND [2] they are acting up  Answer Assessment - Initial Assessment Questions 1. ONSET: "When did the cough begin?"        Symptoms  X    1   Week   2. SEVERITY: "How bad is the cough today?"          Coughs  About  15  mins    3. RESPIRATORY DISTRESS: "Describe your breathing."          Nothing   4. FEVER: "Do you have a fever?" If so, ask: "What is your temperature, how was it measured, and when did it start?"           no 5. HEMOPTYSIS: "Are you coughing up any blood?" If so ask: "How much?" (flecks, streaks, tablespoons, etc.)        no 6. TREATMENT: "What have you done so far to treat the cough?" (e.g., meds, fluids, humidifier)       No Meds   Is   Drinking  Lots  Fluids      7. CARDIAC HISTORY: "Do you have any history of heart disease?" (e.g., heart attack, congestive heart failure)          NO   8. LUNG HISTORY: "Do you have any history of lung disease?"  (e.g., pulmonary embolus, asthma, emphysema)          No  9. PE RISK FACTORS: "Do you have a history of blood clots?" (or: recent major surgery, recent prolonged travel, bedridden )      No   10. OTHER SYMPTOMS: "Do you have any other symptoms? (e.g., runny nose, wheezing, chest pain)       Runny  Nose     11. PREGNANCY: "Is there any chance you are pregnant?" "When was your last menstrual period?"         N/a   12. TRAVEL: "Have you traveled out of the country in the last month?" (e.g., travel history, exposures)       No  Protocols used: COUGH - ACUTE NON-PRODUCTIVE-A-AH

## 2017-02-18 ENCOUNTER — Ambulatory Visit (INDEPENDENT_AMBULATORY_CARE_PROVIDER_SITE_OTHER): Payer: PPO | Admitting: Family Medicine

## 2017-02-18 ENCOUNTER — Encounter: Payer: Self-pay | Admitting: Family Medicine

## 2017-02-18 VITALS — BP 144/72 | HR 80 | Temp 98.3°F | Wt 140.0 lb

## 2017-02-18 DIAGNOSIS — J069 Acute upper respiratory infection, unspecified: Secondary | ICD-10-CM | POA: Diagnosis not present

## 2017-02-18 NOTE — Progress Notes (Signed)
BP (!) 144/72   Pulse 80   Temp 98.3 F (36.8 C) (Oral)   Wt 140 lb (63.5 kg)   SpO2 98%   BMI 23.30 kg/m    Subjective:    Patient ID: Deanna Bird, female    DOB: [birth year], 84 y.o.   MRN: [id number]  HPI: Deanna Bird is a 84 y.o. female  Chief Complaint  Patient presents with  . Cough  . Nasal Congestion    x 2 days   Patient with some head cold no real congestion just some runny nose some slight cough no fever been ongoing a week or so has no sudden onset no body aches no fevers chills. Relevant past medical, surgical, family and social history reviewed and updated as indicated. Interim medical history since our last visit reviewed. Allergies and medications reviewed and updated.  Review of Systems  Constitutional: Negative.   Respiratory: Negative.   Cardiovascular: Negative.     Per HPI unless specifically indicated above     Objective:    BP (!) 144/72   Pulse 80   Temp 98.3 F (36.8 C) (Oral)   Wt 140 lb (63.5 kg)   SpO2 98%   BMI 23.30 kg/m   Wt Readings from Last 3 Encounters:  02/18/17 140 lb (63.5 kg)  02/04/17 141 lb (64 kg)  11/30/16 145 lb 4.8 oz (65.9 kg)    Physical Exam  Constitutional: She is oriented to person, place, and time. She appears well-developed and well-nourished.  HENT:  Head: Normocephalic and atraumatic.  Eyes: Conjunctivae and EOM are normal.  Neck: Normal range of motion.  Cardiovascular: Normal rate, regular rhythm and normal heart sounds.  Pulmonary/Chest: Effort normal and breath sounds normal.  Musculoskeletal: Normal range of motion.  Neurological: She is alert and oriented to person, place, and time.  Skin: No erythema.  Psychiatric: She has a normal mood and affect. Her behavior is normal. Judgment and thought content normal.    Results for orders placed or performed in visit on 02/04/17  Bayer DCA Hb A1c Waived  Result Value Ref Range   Bayer DCA Hb A1c Waived 4.9 <4.0 %  Basic metabolic panel    Result Value Ref Range   Glucose 75 65 - 99 mg/dL   BUN 30 (H) 8 - 27 mg/dL   Creatinine, Ser 1.31 (H) 0.57 - 1.00 mg/dL   GFR calc non Af Amer 37 (L) >59 mL/min/1.73   GFR calc Af Amer 43 (L) >59 mL/min/1.73   BUN/Creatinine Ratio 23 12 - 28   Sodium 146 (H) 134 - 144 mmol/L   Potassium 4.4 3.5 - 5.2 mmol/L   Chloride 104 96 - 106 mmol/L   CO2 27 20 - 29 mmol/L   Calcium 10.1 8.7 - 10.3 mg/dL  LP+ALT+AST Piccolo, Waived  Result Value Ref Range   ALT (SGPT) Piccolo, Waived 27 10 - 47 U/L   AST (SGOT) Piccolo, Waived 34 11 - 38 U/L   Cholesterol Piccolo, Waived 116 <200 mg/dL   HDL Chol Piccolo, Waived 23 (L) >59 mg/dL   Triglycerides Piccolo,Waived 141 <150 mg/dL   Chol/HDL Ratio Piccolo,Waive 5.0 (H) mg/dL   LDL Chol Calc Piccolo Waived 64 <100 mg/dL   VLDL Chol Calc Piccolo,Waive 28 <30 mg/dL      Assessment & Plan:   Problem List Items Addressed This Visit    None    Visit Diagnoses    Viral upper respiratory tract infection    -  Primary    Discussed URI care and treatment over-the-counter medications Robitussin Tylenol Tylenol sinus Mucinex and chicken soup  Follow up plan: Return if symptoms worsen or fail to improve, for As scheduled.

## 2017-02-26 DIAGNOSIS — R269 Unspecified abnormalities of gait and mobility: Secondary | ICD-10-CM | POA: Insufficient documentation

## 2017-03-08 ENCOUNTER — Other Ambulatory Visit: Payer: Self-pay | Admitting: Family Medicine

## 2017-03-16 ENCOUNTER — Other Ambulatory Visit: Payer: Self-pay | Admitting: Family Medicine

## 2017-03-17 DIAGNOSIS — E559 Vitamin D deficiency, unspecified: Secondary | ICD-10-CM | POA: Diagnosis not present

## 2017-03-17 DIAGNOSIS — E538 Deficiency of other specified B group vitamins: Secondary | ICD-10-CM | POA: Diagnosis not present

## 2017-03-17 DIAGNOSIS — R2689 Other abnormalities of gait and mobility: Secondary | ICD-10-CM | POA: Diagnosis not present

## 2017-03-17 DIAGNOSIS — G729 Myopathy, unspecified: Secondary | ICD-10-CM | POA: Diagnosis not present

## 2017-03-18 ENCOUNTER — Other Ambulatory Visit: Payer: Self-pay | Admitting: Neurology

## 2017-03-18 ENCOUNTER — Other Ambulatory Visit: Payer: Self-pay | Admitting: Family Medicine

## 2017-03-18 DIAGNOSIS — R2689 Other abnormalities of gait and mobility: Secondary | ICD-10-CM

## 2017-03-18 DIAGNOSIS — G729 Myopathy, unspecified: Secondary | ICD-10-CM

## 2017-03-18 NOTE — Telephone Encounter (Signed)
LOV 02/04/17 with Dr. Jeananne Rama    [facility] [place], [place] - [address].

## 2017-03-18 NOTE — Telephone Encounter (Signed)
Routing to provider  

## 2017-03-19 DIAGNOSIS — R2689 Other abnormalities of gait and mobility: Secondary | ICD-10-CM | POA: Insufficient documentation

## 2017-03-29 ENCOUNTER — Ambulatory Visit
Admission: RE | Admit: 2017-03-29 | Discharge: 2017-03-29 | Disposition: A | Discharge status: Home / Self Care | Payer: PPO | Source: Ambulatory Visit | Attending: Neurology | Admitting: Neurology

## 2017-03-29 DIAGNOSIS — G729 Myopathy, unspecified: Secondary | ICD-10-CM | POA: Insufficient documentation

## 2017-03-29 DIAGNOSIS — R2689 Other abnormalities of gait and mobility: Secondary | ICD-10-CM | POA: Insufficient documentation

## 2017-03-29 DIAGNOSIS — M48061 Spinal stenosis, lumbar region without neurogenic claudication: Secondary | ICD-10-CM | POA: Insufficient documentation

## 2017-03-31 DIAGNOSIS — G6289 Other specified polyneuropathies: Secondary | ICD-10-CM | POA: Diagnosis not present

## 2017-03-31 DIAGNOSIS — M5410 Radiculopathy, site unspecified: Secondary | ICD-10-CM | POA: Diagnosis not present

## 2017-06-21 DIAGNOSIS — R2689 Other abnormalities of gait and mobility: Secondary | ICD-10-CM | POA: Diagnosis not present

## 2017-06-21 DIAGNOSIS — M5416 Radiculopathy, lumbar region: Secondary | ICD-10-CM | POA: Diagnosis not present

## 2017-06-24 DIAGNOSIS — M5416 Radiculopathy, lumbar region: Secondary | ICD-10-CM | POA: Insufficient documentation

## 2017-07-01 ENCOUNTER — Ambulatory Visit: Payer: PPO | Attending: Neurology | Admitting: Physical Therapy

## 2017-07-01 ENCOUNTER — Encounter: Payer: Self-pay | Admitting: Physical Therapy

## 2017-07-01 ENCOUNTER — Other Ambulatory Visit: Payer: Self-pay

## 2017-07-01 DIAGNOSIS — R2689 Other abnormalities of gait and mobility: Secondary | ICD-10-CM | POA: Diagnosis not present

## 2017-07-01 DIAGNOSIS — M6281 Muscle weakness (generalized): Secondary | ICD-10-CM

## 2017-07-01 DIAGNOSIS — R2681 Unsteadiness on feet: Secondary | ICD-10-CM | POA: Insufficient documentation

## 2017-07-01 NOTE — Therapy (Signed)
[place] MAIN [facility] SERVICES [address] [place], [place], [address] Phone: [phone]   Fax:  [phone]  Physical Therapy Evaluation  Patient Details  Name: Deanna Bird MRN: [id number] Date of Birth: October 23, 1928 Referring Provider: Vladimir Crofts, MD    Encounter Date: 07/01/2017  PT End of Session - 07/01/17 1406    Visit Number  1    Number of Visits  17    Date for PT Re-Evaluation  08/26/17    PT Start Time  1346    PT Stop Time  1454    PT Time Calculation (min)  68 min    Equipment Utilized During Treatment  Gait belt    Activity Tolerance  Patient tolerated treatment well;Patient limited by fatigue    Behavior During Therapy  [facility] for tasks assessed/performed       Past Medical History:  Diagnosis Date  . Achalasia   . Barrett's esophagus with dysplasia   . Diabetes mellitus without complication ([place])   . GERD (gastroesophageal reflux disease)   . Hyperlipidemia   . Hypertension   . MRSA infection    lung  . Thyroid disease     Past Surgical History:  Procedure Laterality Date  . ABDOMINAL HYSTERECTOMY    . achalasia    . APPENDECTOMY    . HIATAL HERNIA REPAIR    . KNEE ARTHROPLASTY Right 04/01/2016   Procedure: COMPUTER ASSISTED TOTAL KNEE ARTHROPLASTY;  Surgeon: Dereck Leep, MD;  Location: [facility] ORS;  Service: Orthopedics;  Laterality: Right;  . KNEE ARTHROPLASTY Left 11/30/2016   Procedure: COMPUTER ASSISTED TOTAL KNEE ARTHROPLASTY;  Surgeon: Dereck Leep, MD;  Location: [facility] ORS;  Service: Orthopedics;  Laterality: Left;  . PARATHYROIDECTOMY    . THYROID EXPLORATION    . TONSILLECTOMY      There were no vitals filed for this visit.   Subjective Assessment - 07/01/17 1358    Subjective  Pt presents with imbalance, weakness, and L buttocks pain    Pertinent History  Pt is a 84 y/o F who reports she has been diagnosed with spinal stenosis but that she is here for her balance.  Pt reports 3 falls in  the past month with one of them occurring yesterday and a total of 4 falls in the past 6 months.  Pt says she is weak in her LLE, causing her to pull on the rail when going up steps, placing a lot of weight through her RUE making her R arm hurt.  Pt denies weakness in LUE. Pt ambulates with her hurry cane at all times. Pt believes her imbalance began in 2017.  Goals: "to be able to walk without feeling like I am going to stumble", working in the yard without losing her balance. Pt reports pain in L buttocks with sit>stand and standing, no pain in sitting. Pain for several months on and off, currently on for 3-4 days. Says it started after sitting on pillow in car.  Pt says she is constipated and is taking dulcolax for this, pt denies night sweats and saddle anesthesia. Pt reports she is tired all of the time and feels "washed out" but denies suicidal thoughts, feeling down, or feeling depressed.  Told pt this therapist would like to notify pt's MD of pt's feeling of "washed out" but pt politely requests for pt to let MD know during her appointment on 7/12. Pt has lost 50 lbs over the course of 2 years (Dr. Manuella Ghazi  aware). Pt contributes this to pneumonia and being on antibiotics. EMG on 3/29 with Impression: "This is an abnormal electrodiagnostic study consistent with a 1) generalized polyneuropathy. 2) superimposed chronic L5 radiculopathy."     Limitations  Lifting;Standing;Walking;House hold activities    How long can you stand comfortably?  10 minutes without UE support    How long can you walk comfortably?  household distances due to fatigue    Diagnostic tests  MRI on 03/29/17: "L4-5 severe facet arthropathy with anterolisthesis. Spinal stenosis is advanced and there is left L4 foraminal impingement. Elsewhere mild for age degenerative changes as described above. Pt had EMG done on 3/29: "EMG & NCV Findings: Evaluation of the Left peroneal motor nerve showed no response (Ankle) and no response (Ankle). The  Left tibial motor nerve showed prolonged distal onset latency (7.0 ms) and reduced amplitude (1.0 mV). The Left ulnar motor nerve showed prolonged distal onset latency (5.0 ms). The Left radial sensory nerve showed prolonged distal peak latency (2.6 ms). The Left superficial peroneal sensory nerve showed no response (14 cm). The Left sural sensory nerve showed no response (Calf). The Left ulnar sensory nerve showed prolonged distal peak latency (3.8 ms) and decreased conduction velocity (Wrist-5th Digit, 32 m/s)".     Patient Stated Goals  see above    Currently in Pain?  No/denies         [facility] PT Assessment - 07/01/17 1407      Assessment   Medical Diagnosis  Gait    Referring Provider  Vladimir Crofts, MD     Onset Date/Surgical Date  07/02/15 ~2 years ago    Hand Dominance  Right    Next MD Visit  July 17th with PCP    Prior Therapy  Yes, for TKA      Precautions   Precautions  Fall      Restrictions   Weight Bearing Restrictions  No      Balance Screen   Has the patient fallen in the past 6 months  Yes    How many times?  4    Has the patient had a decrease in activity level because of a fear of falling?   Yes    Is the patient reluctant to leave their home because of a fear of falling?   Yes      [place]  Private residence    Living Arrangements  Spouse/significant other    Available Help at Discharge  Family    Type of [place] entrance    [place] of Steps  flight    Alternate [place] - manual;Cane - single point;Walker - 2 wheels;Toilet riser;Bedside commode;Grab bars - tub/shower;Tub bench      Prior Function   Level of Independence  Independent with household mobility with device    Vocation  Retired    Leisure  [employer], reading, playing the piano      Cognition   Overall Cognitive Status  Within  Functional Limits for tasks assessed      ROM / Strength   AROM / PROM / Strength  Strength      Strength   Overall Strength  Deficits    Strength Assessment Site  Hip;Knee;Ankle    Right/Left Hip  Right;Left    Right Hip Flexion  3/5  Right Hip External Rotation   3-/5    Right Hip Internal Rotation  3-/5    Right Hip ABduction  2/5    Right Hip ADduction  3/5    Left Hip Flexion  3/5    Left Hip External Rotation  2/5    Left Hip Internal Rotation  2-/5    Left Hip ABduction  2+/5    Left Hip ADduction  3/5    Right/Left Knee  Right;Left    Right Knee Flexion  4/5    Right Knee Extension  4+/5    Left Knee Flexion  2+/5 painful at buttocks region    Left Knee Extension  3/5    Right/Left Ankle  Right;Left    Right Ankle Dorsiflexion  3-/5    Left Ankle Dorsiflexion  3-/5      Balance   Balance Assessed  Yes      Standardized Balance Assessment   Standardized Balance Assessment  Berg Balance Test      Berg Balance Test   Sit to Stand  Able to stand using hands after several tries    Standing Unsupported  Able to stand 2 minutes with supervision    Sitting with Back Unsupported but Feet Supported on Floor or Stool  Able to sit safely and securely 2 minutes    Stand to Sit  Controls descent by using hands    Transfers  Able to transfer safely, definite need of hands    Standing Unsupported with Eyes Closed  Able to stand 10 seconds with supervision    Standing Ubsupported with Feet Together  Able to place feet together independently and stand for 1 minute with supervision    From Standing, Reach Forward with Outstretched Arm  Can reach forward >5 cm safely (2")    From Standing Position, Pick up Object from [place] to pick up shoe safely and easily    From Standing Position, Turn to Look Behind Over each Shoulder  Looks behind from both sides and weight shifts well    Turn 360 Degrees  Needs close supervision or verbal cueing    Standing Unsupported, Alternately  Place Feet on Step/Stool  Needs assistance to keep from falling or unable to try    Standing Unsupported, One Foot in [employer] balance while stepping or standing    Standing on One Leg  Unable to try or needs assist to prevent fall    Total Score  32        EXAMINATION   5xSTS: 24.98 seconds with BUE assist   68mWT: 0.50 m/s   Berg Balance Test: 32/56   Palpation: TTP L greater trochanter with bruise the size of a nickel from fall 6/26, negative palpation tenderness L ischial tuberosity   Sensation: dec sensation to light touch on dorsal and plantar surfaces of Bil feet   Reflexes: +2 Bil achilles and R patella, 0 L patella   Posture: rounded shoulders and forward head posture with posterior pelvic tilt in sitting   Gait Analysis using SPC: Dec weight shift to LLE and dec stance time to LLE. Dec DF Bil with less on LLE. Dec Bil hip E and L hip F.   Hamstring length: lacking 41 deg on LLE and reproducing pt's L buttocks pain with stretch, lacking 42 deg on the R side   SIJ Compression and Distraction Test: negative   Ober's test: negative Bil   SLR Test: negative BLE   FABER Test: negative  BLE   FADDIR Test: negative BLE        TREATMENT   Sit<>stand x 5 with UE assist (added to HEP)   Instructed pt to apply ice to proximal hamstring at site of pain for 15-20 minutes several times each day. Pt educated in proper safety technique using ice, pt verbalized understanding.          Objective measurements completed on examination: See above findings.              PT Education - 07/01/17 1645    Education Details  HEP and handout; role of PT; examination findings; POC    Person(s) Educated  Patient    Methods  Explanation;Verbal cues;Handout;Demonstration    Comprehension  Verbalized understanding;Verbal cues required;Need further instruction;Returned demonstration       PT Short Term Goals - 07/01/17 1611      PT SHORT TERM GOAL #1   Title   Pt will independent complete HEP at least 4 days/wk for carryover between sessions    Time  2    Period  Weeks    Status  New        PT Long Term Goals - 07/01/17 1612      PT LONG TERM GOAL #1   Title  [name] 5xSTS will improve to equal to or less than 16 seconds without UE assist to demonstrate improved BLE strength    Baseline  24.98 seconds with UE assist    Time  8    Period  Weeks    Status  New      PT LONG TERM GOAL #2   Title  [name] 28mWT time will improve to equal to or greater than 0.9 m/s to demonstrate improved safety ambulating in the community    Baseline  0.50 m/s     Time  6    Period  Weeks    Status  New      PT LONG TERM GOAL #3   Title  [name] Berg Balance Test score will improve to equal to or greater than 44/56 to demonstrate decreased likelihood of falling    Baseline  32/56    Time  8    Period  Weeks    Status  New      PT LONG TERM GOAL #4   Title  Pt will be able to walk at least 200 ft without a rest break using SPC to demonstrate improved ambulatory endurance    Baseline  Pt limited to ambulating household distances due to fatigue    Time  7    Period  Weeks    Status  New             Plan - 07/01/17 1606    Clinical Impression Statement  Pt is a 85 y/o F who presents with weakness and imbalance.  Pt reports 3 falls in the past 6 months.  Pt demonstrates significant BLE weakness with LLe more weak than the RLE.  This is likely due to multiple factors: spinal stenosis, recent sickness (pt reports PNA), L hamstring pain.  Suspect that pt has hamstring strain due to pain with L hamstring MMT as well as pain with hamstring length testing.  Pt scored a 32/56 on the Berg Balance Test indicating pt is at an increased risk of falling.  Pt demonstrates decreased gait speed of 0.50 m/s on the 61mWT.  [name] 5xSTS indicates decreased BLE strength.  Pt will benefit from skilled PT interventions for  decreased pain, improved balance, improved strength, and  improved gait mechanics.      History and Personal Factors relevant to plan of care:  (+) pt very motivated, pt with positive results from PT in the past  (-) multiple co-morbidities with spinal stenosis and likely hamstring strain, polyneuropathy    Clinical Presentation  Evolving    Clinical Presentation due to:  [name] balance continues to change due to likely hamstring straing, spinal stenosis and recently diagnosed polyneuropathy    Clinical Decision Making  Moderate    Rehab Potential  Good    PT Frequency  2x / week    PT Duration  8 weeks    PT Treatment/Interventions  ADLs/Self Care Home Management;Aquatic Therapy;Biofeedback;Cryotherapy;Electrical Stimulation;Iontophoresis 4mg /ml Dexamethasone;Moist Heat;Traction;Ultrasound;DME Instruction;Contrast Bath;Gait training;Stair training;Functional mobility training;Therapeutic activities;Therapeutic exercise;Balance training;Neuromuscular re-education;Patient/family education;Orthotic Fit/Training;Manual techniques;Compression bandaging;Passive range of motion;Dry needling;Energy conservation;Taping    PT Next Visit Plan  Semmes-Wienstein testing and discuss checking feet regularly, consider AFO in the future, balance and strength training, gait training, treatment for L hamstring strain     PT Home Exercise Plan  sit<>stand with UE assist    Recommended Other Services  follow up with pt about her feeling unmotivated and "washed out" after appointment with PCP on July 12th (per pt request)    Consulted and Agree with Plan of Care  Patient       Patient will benefit from skilled therapeutic intervention in order to improve the following deficits and impairments:  Abnormal gait, Decreased activity tolerance, Decreased balance, Decreased endurance, Decreased knowledge of use of DME, Decreased mobility, Decreased range of motion, Decreased safety awareness, Decreased strength, Difficulty walking, Hypomobility, Increased fascial restricitons, Impaired  perceived functional ability, Increased muscle spasms, Impaired flexibility, Impaired sensation, Improper body mechanics, Postural dysfunction, Pain  Visit Diagnosis: Unsteadiness on feet  Other abnormalities of gait and mobility  Muscle weakness (generalized)     Problem List Patient Active Problem List   Diagnosis Date Noted  . Advanced care planning/counseling discussion 08/04/2016  . S/P total knee arthroplasty 04/01/2016  . Primary osteoarthritis of right knee 03/19/2016  . Hyperlipidemia 11/19/2014  . Diabetes mellitus without complication ([place]) 35/45/6256  . Nonspecific abnormal results of thyroid function study 07/18/2014  . Hypertension 07/18/2014    Collie Siad PT, DPT 07/01/2017, 4:46 PM  [place] MAIN [facility] SERVICES [address] [place], [place], [address] Phone: [phone]   Fax:  [phone]  Name: Deanna Bird MRN: [id number] Date of Birth: December 03, 1928

## 2017-07-01 NOTE — Patient Instructions (Signed)
Access Code: [email or web address]  URL: [email or web address]  Date: 07/01/2017  Prepared by: Collie Siad   Exercises  Sit to Stand with Armchair - 5 reps - 2 sets - 2x daily - 7x weekly

## 2017-07-07 ENCOUNTER — Ambulatory Visit: Payer: PPO | Admitting: Physical Therapy

## 2017-07-13 ENCOUNTER — Ambulatory Visit: Payer: PPO | Attending: Neurology | Admitting: Physical Therapy

## 2017-07-13 ENCOUNTER — Encounter: Payer: Self-pay | Admitting: Physical Therapy

## 2017-07-13 VITALS — BP 134/50 | HR 73

## 2017-07-13 DIAGNOSIS — R2681 Unsteadiness on feet: Secondary | ICD-10-CM | POA: Diagnosis not present

## 2017-07-13 DIAGNOSIS — M6281 Muscle weakness (generalized): Secondary | ICD-10-CM | POA: Diagnosis not present

## 2017-07-13 DIAGNOSIS — R2689 Other abnormalities of gait and mobility: Secondary | ICD-10-CM | POA: Diagnosis not present

## 2017-07-13 NOTE — Patient Instructions (Signed)
Access Code: [email or web address]  URL: [email or web address]  Date: 07/13/2017  Prepared by: Collie Siad   Exercises  Romberg Stance with Head Rotation - 10 reps - 2 sets - 2x daily - 7x weekly  Tandem Stance with Support - 2 reps - 1 sets - 60 hold - 2x daily - 7x weekly  Standing Marching - 20 reps - 2 sets - 2x daily - 7x weekly

## 2017-07-13 NOTE — Therapy (Signed)
[place] MAIN [facility] SERVICES [address] [place], [place], [address] Phone: [phone]   Fax:  [phone]  Physical Therapy Treatment  Patient Details  Name: Deanna Bird MRN: [id number] Date of Birth: [birth year] Referring Provider: Vladimir Crofts, MD    Encounter Date: 07/13/2017  PT End of Session - 07/13/17 1431    Visit Number  2    Number of Visits  17    Date for PT Re-Evaluation  08/26/17    PT Start Time  2353    PT Stop Time  1513    PT Time Calculation (min)  42 min    Equipment Utilized During Treatment  Gait belt    Activity Tolerance  Patient tolerated treatment well;Patient limited by fatigue    Behavior During Therapy  [facility] for tasks assessed/performed       Past Medical History:  Diagnosis Date  . Achalasia   . Barrett's esophagus with dysplasia   . Diabetes mellitus without complication ([place])   . GERD (gastroesophageal reflux disease)   . Hyperlipidemia   . Hypertension   . MRSA infection    lung  . Thyroid disease     Past Surgical History:  Procedure Laterality Date  . ABDOMINAL HYSTERECTOMY    . achalasia    . APPENDECTOMY    . HIATAL HERNIA REPAIR    . KNEE ARTHROPLASTY Right 04/01/2016   Procedure: COMPUTER ASSISTED TOTAL KNEE ARTHROPLASTY;  Surgeon: Dereck Leep, MD;  Location: [facility] ORS;  Service: Orthopedics;  Laterality: Right;  . KNEE ARTHROPLASTY Left 11/30/2016   Procedure: COMPUTER ASSISTED TOTAL KNEE ARTHROPLASTY;  Surgeon: Dereck Leep, MD;  Location: [facility] ORS;  Service: Orthopedics;  Laterality: Left;  . PARATHYROIDECTOMY    . THYROID EXPLORATION    . TONSILLECTOMY      Vitals:   07/13/17 1437  BP: (!) 134/50  Pulse: 73  SpO2: 100%    Subjective Assessment - 07/13/17 1435    Subjective  Pt reports she is doing well and has been having less buttocks pain since she has been icing her buttocks 3x/day.  Pt denies any falls since last session.      Pertinent History  Pt is a 84 y/o  F who reports she has been diagnosed with spinal stenosis but that she is here for her balance.  Pt reports 3 falls in the past month with one of them occurring yesterday and a total of 4 falls in the past 6 months.  Pt says she is weak in her LLE, causing her to pull on the rail when going up steps, placing a lot of weight through her RUE making her R arm hurt.  Pt denies weakness in LUE. Pt ambulates with her hurry cane at all times. Pt believes her imbalance began in 2017.  Goals: "to be able to walk without feeling like I am going to stumble", working in the yard without losing her balance. Pt reports pain in L buttocks with sit>stand and standing, no pain in sitting. Pain for several months on and off, currently on for 3-4 days. Says it started after sitting on pillow in car.  Pt says she is constipated and is taking dulcolax for this, pt denies night sweats and saddle anesthesia. Pt reports she is tired all of the time and feels "washed out" but denies suicidal thoughts, feeling down, or feeling depressed.  Told pt this therapist would like to notify pt's MD of pt's feeling of "  washed out" but pt politely requests for pt to let MD know during her appointment on 7/12. Pt has lost 50 lbs over the course of 2 years (Dr. Manuella Ghazi aware). Pt contributes this to pneumonia and being on antibiotics. EMG on 3/29 with Impression: "This is an abnormal electrodiagnostic study consistent with a 1) generalized polyneuropathy. 2) superimposed chronic L5 radiculopathy."     Limitations  Lifting;Standing;Walking;House hold activities    How long can you stand comfortably?  10 minutes without UE support    How long can you walk comfortably?  household distances due to fatigue    Diagnostic tests  MRI on 03/29/17: "L4-5 severe facet arthropathy with anterolisthesis. Spinal stenosis is advanced and there is left L4 foraminal impingement. Elsewhere mild for age degenerative changes as described above. Pt had EMG done on 3/29: "EMG  & NCV Findings: Evaluation of the Left peroneal motor nerve showed no response (Ankle) and no response (Ankle). The Left tibial motor nerve showed prolonged distal onset latency (7.0 ms) and reduced amplitude (1.0 mV). The Left ulnar motor nerve showed prolonged distal onset latency (5.0 ms). The Left radial sensory nerve showed prolonged distal peak latency (2.6 ms). The Left superficial peroneal sensory nerve showed no response (14 cm). The Left sural sensory nerve showed no response (Calf). The Left ulnar sensory nerve showed prolonged distal peak latency (3.8 ms) and decreased conduction velocity (Wrist-5th Digit, 32 m/s)".     Patient Stated Goals  see above    Currently in Pain?  No/denies         TREATMENT   Seated marching with 5# ankles weights 2x10 each LE  LAQ with 5# ankle weight 3x10 each LE  Prone eccentric hamstring strengthening without weight 3x10 each LE. Pt requires assist with LLE due to weakness with tendency for hip ER  Sit<>stand 2x5 from slightly elevated mat table without UE assist the first 2 reps but then using hands on thighs for remaining 3 reps  Marching in standing with 1UE support x20 (added to HEP)  Romberg stance with eyes open with horizontal head turns x10 each direction. 2 sets. (added to HEP)  Tandem stance 2x60 seconds each LE (added to HEP)  Alternating toe taps to airex pad x10 each LE with intermittent UE support due to LOB                        PT Education - 07/13/17 1431    Education Details  Exercise technique; instructed pt to check the bottom of her feet each night either by crossing her legs or by using her full length mirror, checking every night    Person(s) Educated  Patient    Methods  Explanation;Demonstration;Verbal cues    Comprehension  Returned demonstration;Verbalized understanding;Verbal cues required;Need further instruction       PT Short Term Goals - 07/01/17 1611      PT SHORT TERM GOAL #1   Title  Pt  will independent complete HEP at least 4 days/wk for carryover between sessions    Time  2    Period  Weeks    Status  New        PT Long Term Goals - 07/01/17 1612      PT LONG TERM GOAL #1   Title  [name] 5xSTS will improve to equal to or less than 16 seconds without UE assist to demonstrate improved BLE strength    Baseline  24.98 seconds with UE assist  Time  8    Period  Weeks    Status  New      PT LONG TERM GOAL #2   Title  [name] 52mWT time will improve to equal to or greater than 0.9 m/s to demonstrate improved safety ambulating in the community    Baseline  0.50 m/s     Time  6    Period  Weeks    Status  New      PT LONG TERM GOAL #3   Title  [name] Berg Balance Test score will improve to equal to or greater than 44/56 to demonstrate decreased likelihood of falling    Baseline  32/56    Time  8    Period  Weeks    Status  New      PT LONG TERM GOAL #4   Title  Pt will be able to walk at least 200 ft without a rest break using SPC to demonstrate improved ambulatory endurance    Baseline  Pt limited to ambulating household distances due to fatigue    Time  7    Period  Weeks    Status  New            Plan - 07/13/17 1440    Clinical Impression Statement  Encouraged pt to continue with icing L hamstring at proximal insertion as she has been feeling relief with this.  Instructed pt to check the bottom of her feet each night either by crossing her legs or by using her full length mirror, checking every night.  Pt demonstrates Bil hamstring weakness and requires assist with L hamstring curls in prone. Pt demonstrates impaired balance with romberg and tandem stance and these exercises were added to her HEP as well as marching in standing.  Pt will benefit from continued skilled PT interventions for improved strength and decreased risk of falling.     Rehab Potential  Good    PT Frequency  2x / week    PT Duration  8 weeks    PT Treatment/Interventions  ADLs/Self Care  Home Management;Aquatic Therapy;Biofeedback;Cryotherapy;Electrical Stimulation;Iontophoresis 4mg /ml Dexamethasone;Moist Heat;Traction;Ultrasound;DME Instruction;Contrast Bath;Gait training;Stair training;Functional mobility training;Therapeutic activities;Therapeutic exercise;Balance training;Neuromuscular re-education;Patient/family education;Orthotic Fit/Training;Manual techniques;Compression bandaging;Passive range of motion;Dry needling;Energy conservation;Taping    PT Next Visit Plan  Semmes-Wienstein testing and discuss checking feet regularly, consider AFO in the future, balance and strength training, gait training, treatment for L hamstring strain     PT Home Exercise Plan  sit<>stand with UE assist, tandem stance, romberg with horizontal head turns, marching in standing    Consulted and Agree with Plan of Care  Patient       Patient will benefit from skilled therapeutic intervention in order to improve the following deficits and impairments:  Abnormal gait, Decreased activity tolerance, Decreased balance, Decreased endurance, Decreased knowledge of use of DME, Decreased mobility, Decreased range of motion, Decreased safety awareness, Decreased strength, Difficulty walking, Hypomobility, Increased fascial restricitons, Impaired perceived functional ability, Increased muscle spasms, Impaired flexibility, Impaired sensation, Improper body mechanics, Postural dysfunction, Pain  Visit Diagnosis: Unsteadiness on feet  Other abnormalities of gait and mobility  Muscle weakness (generalized)     Problem List Patient Active Problem List   Diagnosis Date Noted  . Advanced care planning/counseling discussion 08/04/2016  . S/P total knee arthroplasty 04/01/2016  . Primary osteoarthritis of right knee 03/19/2016  . Hyperlipidemia 11/19/2014  . Diabetes mellitus without complication ([place]) 94/70/9628  . Nonspecific abnormal results of thyroid function study 07/18/2014  .  Hypertension 07/18/2014     Collie Siad PT, DPT 07/13/2017, 3:14 PM  [place] MAIN [facility] SERVICES [address] [place], [place], [address] Phone: [phone]   Fax:  [phone]  Name: Deanna Bird MRN: [id number] Date of Birth: 01/08/1928

## 2017-07-15 ENCOUNTER — Encounter: Payer: Self-pay | Admitting: Physical Therapy

## 2017-07-15 ENCOUNTER — Ambulatory Visit: Payer: PPO | Admitting: Physical Therapy

## 2017-07-15 DIAGNOSIS — R2689 Other abnormalities of gait and mobility: Secondary | ICD-10-CM

## 2017-07-15 DIAGNOSIS — M6281 Muscle weakness (generalized): Secondary | ICD-10-CM

## 2017-07-15 DIAGNOSIS — R2681 Unsteadiness on feet: Secondary | ICD-10-CM | POA: Diagnosis not present

## 2017-07-15 NOTE — Therapy (Signed)
[place] MAIN [facility] SERVICES [address] [place], [place], [address] Phone: [phone]   Fax:  [phone]  Physical Therapy Treatment  Patient Details  Name: Deanna Bird MRN: [id number] Date of Birth: June 22, 1928 Referring Provider: Vladimir Crofts, MD    Encounter Date: 07/15/2017  PT End of Session - 07/15/17 1352    Visit Number  3    Number of Visits  17    Date for PT Re-Evaluation  08/26/17    PT Start Time  0147    PT Stop Time  0230    PT Time Calculation (min)  43 min    Equipment Utilized During Treatment  Gait belt    Activity Tolerance  Patient tolerated treatment well;Patient limited by fatigue    Behavior During Therapy  [facility] for tasks assessed/performed       Past Medical History:  Diagnosis Date  . Achalasia   . Barrett's esophagus with dysplasia   . Diabetes mellitus without complication ([place])   . GERD (gastroesophageal reflux disease)   . Hyperlipidemia   . Hypertension   . MRSA infection    lung  . Thyroid disease     Past Surgical History:  Procedure Laterality Date  . ABDOMINAL HYSTERECTOMY    . achalasia    . APPENDECTOMY    . HIATAL HERNIA REPAIR    . KNEE ARTHROPLASTY Right 04/01/2016   Procedure: COMPUTER ASSISTED TOTAL KNEE ARTHROPLASTY;  Surgeon: Dereck Leep, MD;  Location: [facility] ORS;  Service: Orthopedics;  Laterality: Right;  . KNEE ARTHROPLASTY Left 11/30/2016   Procedure: COMPUTER ASSISTED TOTAL KNEE ARTHROPLASTY;  Surgeon: Dereck Leep, MD;  Location: [facility] ORS;  Service: Orthopedics;  Laterality: Left;  . PARATHYROIDECTOMY    . THYROID EXPLORATION    . TONSILLECTOMY      There were no vitals filed for this visit.  Subjective Assessment - 07/15/17 1351    Subjective  Patient reports that her knees are tired but notthing is sore.     Currently in Pain?  No/denies    Multiple Pain Sites  No       Treatmetnt:    NEUROMUSCULAR RE-EDUCATION  1/2 foam flat side up and balance  with head turns left and right feet apart and feet together,x 2 mins  tandem standing on 1/2 foamflat side down with ball catch, and bounding ball and ball toss x 2 mins  side stepping left and right in parallel bars 10 feet x 3  Stepping over hurdle x 15  Fwd/bwd, side to side x 15, CGA with min assist  step ups from foam  to 6 inch stool x 20 bilateralwith CGA  Side stepping with air foam and CGA x 10 x 4 laps  Tandem stand on foam with rotation and ball hold left and right x 10 , min assist  Tapping from floor to stepping stones x 20   Patient needs occasional verbal cueing to improve posture and cueing to correctly perform exercises slowly, holding at end of range to increase motor firing of desired muscle to encourage fatigue.                   PT Education - 07/15/17 1351    Education Details  HEP    Person(s) Educated  Patient    Methods  Explanation    Comprehension  Verbalized understanding;Returned demonstration       PT Short Term Goals - 07/01/17 1611  PT SHORT TERM GOAL #1   Title  Pt will independent complete HEP at least 4 days/wk for carryover between sessions    Time  2    Period  Weeks    Status  New        PT Long Term Goals - 07/01/17 1612      PT LONG TERM GOAL #1   Title  [name] 5xSTS will improve to equal to or less than 16 seconds without UE assist to demonstrate improved BLE strength    Baseline  24.98 seconds with UE assist    Time  8    Period  Weeks    Status  New      PT LONG TERM GOAL #2   Title  [name] 88mWT time will improve to equal to or greater than 0.9 m/s to demonstrate improved safety ambulating in the community    Baseline  0.50 m/s     Time  6    Period  Weeks    Status  New      PT LONG TERM GOAL #3   Title  [name] Berg Balance Test score will improve to equal to or greater than 44/56 to demonstrate decreased likelihood of falling    Baseline  32/56    Time  8    Period  Weeks    Status   New      PT LONG TERM GOAL #4   Title  Pt will be able to walk at least 200 ft without a rest break using SPC to demonstrate improved ambulatory endurance    Baseline  Pt limited to ambulating household distances due to fatigue    Time  7    Period  Weeks    Status  New            Plan - 07/15/17 1353    Clinical Impression Statement  Patient demonstrates improved stability and strength allowing patient to perform short duration standing interventions with rest periods.  Patient performs beginning standing dynamic standing balance exercises to shift weight and perform single leg standing activities with mod assist. Patient fatigues quickly with exercises requiring rest breaks at this time. Patient will continue to benefit from skilled physical therapy to improve pain and mobility.    Rehab Potential  Good    PT Frequency  2x / week    PT Duration  8 weeks    PT Treatment/Interventions  ADLs/Self Care Home Management;Aquatic Therapy;Biofeedback;Cryotherapy;Electrical Stimulation;Iontophoresis 4mg /ml Dexamethasone;Moist Heat;Traction;Ultrasound;DME Instruction;Contrast Bath;Gait training;Stair training;Functional mobility training;Therapeutic activities;Therapeutic exercise;Balance training;Neuromuscular re-education;Patient/family education;Orthotic Fit/Training;Manual techniques;Compression bandaging;Passive range of motion;Dry needling;Energy conservation;Taping    PT Next Visit Plan  Semmes-Wienstein testing and discuss checking feet regularly, consider AFO in the future, balance and strength training, gait training, treatment for L hamstring strain     PT Home Exercise Plan  sit<>stand with UE assist, tandem stance, romberg with horizontal head turns, marching in standing    Consulted and Agree with Plan of Care  Patient       Patient will benefit from skilled therapeutic intervention in order to improve the following deficits and impairments:  Abnormal gait, Decreased activity  tolerance, Decreased balance, Decreased endurance, Decreased knowledge of use of DME, Decreased mobility, Decreased range of motion, Decreased safety awareness, Decreased strength, Difficulty walking, Hypomobility, Increased fascial restricitons, Impaired perceived functional ability, Increased muscle spasms, Impaired flexibility, Impaired sensation, Improper body mechanics, Postural dysfunction, Pain  Visit Diagnosis: Unsteadiness on feet  Other abnormalities of gait and mobility  Muscle  weakness (generalized)     Problem List Patient Active Problem List   Diagnosis Date Noted  . Advanced care planning/counseling discussion 08/04/2016  . S/P total knee arthroplasty 04/01/2016  . Primary osteoarthritis of right knee 03/19/2016  . Hyperlipidemia 11/19/2014  . Diabetes mellitus without complication ([place]) 75/44/9201  . Nonspecific abnormal results of thyroid function study 07/18/2014  . Hypertension 07/18/2014    Alanson Puls, PT DPT 07/15/2017, 1:57 PM  [place] MAIN [facility] SERVICES [address] [place], [place], [address] Phone: [phone]   Fax:  [phone]  Name: MAZELLA DEEN MRN: [id number] Date of Birth: [birth year]

## 2017-07-20 ENCOUNTER — Encounter: Payer: Self-pay | Admitting: Physical Therapy

## 2017-07-20 ENCOUNTER — Ambulatory Visit: Payer: PPO | Admitting: Physical Therapy

## 2017-07-20 VITALS — BP 94/41 | HR 74

## 2017-07-20 DIAGNOSIS — M6281 Muscle weakness (generalized): Secondary | ICD-10-CM

## 2017-07-20 DIAGNOSIS — R2681 Unsteadiness on feet: Secondary | ICD-10-CM

## 2017-07-20 DIAGNOSIS — R2689 Other abnormalities of gait and mobility: Secondary | ICD-10-CM

## 2017-07-20 NOTE — Therapy (Signed)
[place] MAIN [facility] SERVICES [address] [place], [place], [address] Phone: [phone]   Fax:  [phone]  Physical Therapy Treatment  Patient Details  Name: Deanna Bird MRN: [id number] Date of Birth: 1928-05-21 Referring Provider: Vladimir Crofts, MD    Encounter Date: 07/20/2017  PT End of Session - 07/20/17 1431    Visit Number  4    Number of Visits  17    Date for PT Re-Evaluation  08/26/17    PT Start Time  1430    PT Stop Time  1514    PT Time Calculation (min)  44 min    Equipment Utilized During Treatment  Gait belt    Activity Tolerance  Patient tolerated treatment well;Patient limited by fatigue    Behavior During Therapy  [facility] for tasks assessed/performed       Past Medical History:  Diagnosis Date  . Achalasia   . Barrett's esophagus with dysplasia   . Diabetes mellitus without complication ([place])   . GERD (gastroesophageal reflux disease)   . Hyperlipidemia   . Hypertension   . MRSA infection    lung  . Thyroid disease     Past Surgical History:  Procedure Laterality Date  . ABDOMINAL HYSTERECTOMY    . achalasia    . APPENDECTOMY    . HIATAL HERNIA REPAIR    . KNEE ARTHROPLASTY Right 04/01/2016   Procedure: COMPUTER ASSISTED TOTAL KNEE ARTHROPLASTY;  Surgeon: Dereck Leep, MD;  Location: [facility] ORS;  Service: Orthopedics;  Laterality: Right;  . KNEE ARTHROPLASTY Left 11/30/2016   Procedure: COMPUTER ASSISTED TOTAL KNEE ARTHROPLASTY;  Surgeon: Dereck Leep, MD;  Location: [facility] ORS;  Service: Orthopedics;  Laterality: Left;  . PARATHYROIDECTOMY    . THYROID EXPLORATION    . TONSILLECTOMY      Vitals:   07/20/17 1438  BP: (!) 94/41  Pulse: 74  SpO2: 100%    Subjective Assessment - 07/20/17 1436    Subjective  Pt reports she is "fair".  She says that she is still icing her L buttocks.  She is currently not having pain in her L buttocks but today she has had pain up to 4-5/10 with sit<>stand.  She says  she did not have any pain the two days prior to this.  Pt has not been completing her HEP consistently and only did her HEP x2 over the past week.  Pt cannot offer an explanation as to why but says, "I know what I need to do it's just a matter of doing it".   Pt had her medalert delivered today and wore it to her session.     Pertinent History  Pt is a 84 y/o F who reports she has been diagnosed with spinal stenosis but that she is here for her balance.  Pt reports 3 falls in the past month with one of them occurring yesterday and a total of 4 falls in the past 6 months.  Pt says she is weak in her LLE, causing her to pull on the rail when going up steps, placing a lot of weight through her RUE making her R arm hurt.  Pt denies weakness in LUE. Pt ambulates with her hurry cane at all times. Pt believes her imbalance began in 2017.  Goals: "to be able to walk without feeling like I am going to stumble", working in the yard without losing her balance. Pt reports pain in L buttocks with sit>stand and standing,  no pain in sitting. Pain for several months on and off, currently on for 3-4 days. Says it started after sitting on pillow in car.  Pt says she is constipated and is taking dulcolax for this, pt denies night sweats and saddle anesthesia. Pt reports she is tired all of the time and feels "washed out" but denies suicidal thoughts, feeling down, or feeling depressed.  Told pt this therapist would like to notify pt's MD of pt's feeling of "washed out" but pt politely requests for pt to let MD know during her appointment on 7/12. Pt has lost 50 lbs over the course of 2 years (Dr. Manuella Ghazi aware). Pt contributes this to pneumonia and being on antibiotics. EMG on 3/29 with Impression: "This is an abnormal electrodiagnostic study consistent with a 1) generalized polyneuropathy. 2) superimposed chronic L5 radiculopathy."     Limitations  Lifting;Standing;Walking;House hold activities    How long can you stand comfortably?   10 minutes without UE support    How long can you walk comfortably?  household distances due to fatigue    Diagnostic tests  MRI on 03/29/17: "L4-5 severe facet arthropathy with anterolisthesis. Spinal stenosis is advanced and there is left L4 foraminal impingement. Elsewhere mild for age degenerative changes as described above. Pt had EMG done on 3/29: "EMG & NCV Findings: Evaluation of the Left peroneal motor nerve showed no response (Ankle) and no response (Ankle). The Left tibial motor nerve showed prolonged distal onset latency (7.0 ms) and reduced amplitude (1.0 mV). The Left ulnar motor nerve showed prolonged distal onset latency (5.0 ms). The Left radial sensory nerve showed prolonged distal peak latency (2.6 ms). The Left superficial peroneal sensory nerve showed no response (14 cm). The Left sural sensory nerve showed no response (Calf). The Left ulnar sensory nerve showed prolonged distal peak latency (3.8 ms) and decreased conduction velocity (Wrist-5th Digit, 32 m/s)".     Patient Stated Goals  see above    Currently in Pain?  No/denies         TREATMENT  Marching in standing with 1UE support x20  Romberg stance with eyes open with horizontal head turns x10 each direction. 2 sets.  Romberg stance with eyes open with vertical head turns x10 each direction. 2 sets.  Tandem stance x60 seconds each LE  Alternating toe taps to airex pad x10 each LE with intermittent UE support due to LOB  Ambulated 20 ft with SPC in gym with dec DF Bil (less on LLE) and shuffle of L foot intermittently. Cues and demonstration for improved DF with heel strike, especially with LLE  Seated DF with 2.5# ankle weights on forefoot x30 each LE to fatigue  Prone eccentric hamstring strengthening without weight x30 each LE. Pt requires assist with LLE due to weakness with tendency for hip ER                         PT Education - 07/20/17 1430    Education Details  Exercise technique     Person(s) Educated  Patient    Methods  Explanation;Demonstration;Verbal cues    Comprehension  Verbalized understanding;Returned demonstration;Verbal cues required;Need further instruction       PT Short Term Goals - 07/01/17 1611      PT SHORT TERM GOAL #1   Title  Pt will independent complete HEP at least 4 days/wk for carryover between sessions    Time  2    Period  Weeks  Status  New        PT Long Term Goals - 07/01/17 1612      PT LONG TERM GOAL #1   Title  [name] 5xSTS will improve to equal to or less than 16 seconds without UE assist to demonstrate improved BLE strength    Baseline  24.98 seconds with UE assist    Time  8    Period  Weeks    Status  New      PT LONG TERM GOAL #2   Title  [name] 27mWT time will improve to equal to or greater than 0.9 m/s to demonstrate improved safety ambulating in the community    Baseline  0.50 m/s     Time  6    Period  Weeks    Status  New      PT LONG TERM GOAL #3   Title  [name] Berg Balance Test score will improve to equal to or greater than 44/56 to demonstrate decreased likelihood of falling    Baseline  32/56    Time  8    Period  Weeks    Status  New      PT LONG TERM GOAL #4   Title  Pt will be able to walk at least 200 ft without a rest break using SPC to demonstrate improved ambulatory endurance    Baseline  Pt limited to ambulating household distances due to fatigue    Time  7    Period  Weeks    Status  New            Plan - 07/20/17 1514    Clinical Impression Statement  [name] BP low but denies dizziness or lightheadedness and says it just feels like a normal day.  Followed up with pt about feeling "washed out" reported on initial evaluation and this [name] concern for feeling down or depressed.  The pt says, "I think it is just because I feel tired from working hard to walk".  This PT encouraged the pt to mention this washed out feeling to her PCP at her Wellness visit tomorrow, pt verbalized agreement.  Emphasized importance of adherence to HEP as pt has not been performing consistently.  Considered pt's possible need for L AFO due to reports of tripping due to L foot dragging.  Upon assessment the pt is able to actively perform DF at heel strike with verbal cues thus will focus on strengthening of dorsiflexors.  Pt instructed to practice heel strike with DF when ambulating.  Pt continues to require assist with L hamstring prone strengthening exercise.  Pt will benefit from continued skilled PT interventions for improved balance, decreased pain, and improved independence.      Rehab Potential  Good    PT Frequency  2x / week    PT Duration  8 weeks    PT Treatment/Interventions  ADLs/Self Care Home Management;Aquatic Therapy;Biofeedback;Cryotherapy;Electrical Stimulation;Iontophoresis 4mg /ml Dexamethasone;Moist Heat;Traction;Ultrasound;DME Instruction;Contrast Bath;Gait training;Stair training;Functional mobility training;Therapeutic activities;Therapeutic exercise;Balance training;Neuromuscular re-education;Patient/family education;Orthotic Fit/Training;Manual techniques;Compression bandaging;Passive range of motion;Dry needling;Energy conservation;Taping    PT Next Visit Plan  Semmes-Wienstein testing and discuss checking feet regularly, consider AFO in the future, balance and strength training, gait training, treatment for L hamstring strain     PT Home Exercise Plan  sit<>stand with UE assist, tandem stance, romberg with horizontal head turns, marching in standing    Consulted and Agree with Plan of Care  Patient       Patient will benefit from skilled  therapeutic intervention in order to improve the following deficits and impairments:  Abnormal gait, Decreased activity tolerance, Decreased balance, Decreased endurance, Decreased knowledge of use of DME, Decreased mobility, Decreased range of motion, Decreased safety awareness, Decreased strength, Difficulty walking, Hypomobility, Increased fascial  restricitons, Impaired perceived functional ability, Increased muscle spasms, Impaired flexibility, Impaired sensation, Improper body mechanics, Postural dysfunction, Pain  Visit Diagnosis: Unsteadiness on feet  Other abnormalities of gait and mobility  Muscle weakness (generalized)     Problem List Patient Active Problem List   Diagnosis Date Noted  . Advanced care planning/counseling discussion 08/04/2016  . S/P total knee arthroplasty 04/01/2016  . Primary osteoarthritis of right knee 03/19/2016  . Hyperlipidemia 11/19/2014  . Diabetes mellitus without complication ([place]) 93/96/8864  . Nonspecific abnormal results of thyroid function study 07/18/2014  . Hypertension 07/18/2014    Collie Siad PT, DPT 07/20/2017, 3:15 PM  [place] MAIN [facility] SERVICES [address] [place], [place], [address] Phone: [phone]   Fax:  [phone]  Name: Deanna Bird MRN: [id number] Date of Birth: 11-Jun-1928

## 2017-07-21 ENCOUNTER — Ambulatory Visit (INDEPENDENT_AMBULATORY_CARE_PROVIDER_SITE_OTHER): Payer: PPO

## 2017-07-21 VITALS — BP 118/64 | HR 69 | Temp 98.3°F | Resp 16 | Ht 63.0 in | Wt 124.6 lb

## 2017-07-21 DIAGNOSIS — Z0000 Encounter for general adult medical examination without abnormal findings: Secondary | ICD-10-CM

## 2017-07-21 NOTE — Patient Instructions (Addendum)
Deanna Bird , Thank you for taking time to come for your Medicare Wellness Visit. I appreciate your ongoing commitment to your health goals. Please review the following plan we discussed and let me know if I can assist you in the future.   Screening recommendations/referrals: Colonoscopy: no longer required Mammogram: no longer required Bone Density: completed 07/04/2013, no loner required Recommended yearly ophthalmology/optometry visit for glaucoma screening and checkup Recommended yearly dental visit for hygiene and checkup  Vaccinations: Influenza vaccine: due 09/2017 Pneumococcal vaccine: completed series Tdap vaccine: due, check with your insurance company for coverage  Shingles vaccine: shingrix eligible, check with your insurance company for coverage     Advanced directives: copy on file.   Conditions/risks identified: Recommend drinking at least 6-8 glasses of water a day   Next appointment: Follow up on 08/10/2017 at 10:00 am with Dr.Crissman. Follow up in one year for your annual wellness exam.    Preventive Care 65 Years and Older, Female Preventive care refers to lifestyle choices and visits with your health care provider that can promote health and wellness. What does preventive care include?  A yearly physical exam. This is also called an annual well check.  Dental exams once or twice a year.  Routine eye exams. Ask your health care provider how often you should have your eyes checked.  Personal lifestyle choices, including:  Daily care of your teeth and gums.  Regular physical activity.  Eating a healthy diet.  Avoiding tobacco and drug use.  Limiting alcohol use.  Practicing safe sex.  Taking low-dose aspirin every day.  Taking vitamin and mineral supplements as recommended by your health care provider. What happens during an annual well check? The services and screenings done by your health care provider during your annual well check will depend on your  age, overall health, lifestyle risk factors, and family history of disease. Counseling  Your health care provider may ask you questions about your:  Alcohol use.  Tobacco use.  Drug use.  Emotional well-being.  Home and relationship well-being.  Sexual activity.  Eating habits.  History of falls.  Memory and ability to understand (cognition).  Work and work Statistician.  Reproductive health. Screening  You may have the following tests or measurements:  Height, weight, and BMI.  Blood pressure.  Lipid and cholesterol levels. These may be checked every 5 years, or more frequently if you are over 74 years old.  Skin check.  Lung cancer screening. You may have this screening every year starting at age 78 if you have a 30-pack-year history of smoking and currently smoke or have quit within the past 15 years.  Fecal occult blood test (FOBT) of the stool. You may have this test every year starting at age 62.  Flexible sigmoidoscopy or colonoscopy. You may have a sigmoidoscopy every 5 years or a colonoscopy every 10 years starting at age 77.  Hepatitis C blood test.  Hepatitis B blood test.  Sexually transmitted disease (STD) testing.  Diabetes screening. This is done by checking your blood sugar (glucose) after you have not eaten for a while (fasting). You may have this done every 1-3 years.  Bone density scan. This is done to screen for osteoporosis. You may have this done starting at age 54.  Mammogram. This may be done every 1-2 years. Talk to your health care provider about how often you should have regular mammograms. Talk with your health care provider about your test results, treatment options, and if necessary, the  need for more tests. Vaccines  Your health care provider may recommend certain vaccines, such as:  Influenza vaccine. This is recommended every year.  Tetanus, diphtheria, and acellular pertussis (Tdap, Td) vaccine. You may need a Td booster  every 10 years.  Zoster vaccine. You may need this after age 2.  Pneumococcal 13-valent conjugate (PCV13) vaccine. One dose is recommended after age 49.  Pneumococcal polysaccharide (PPSV23) vaccine. One dose is recommended after age 64. Talk to your health care provider about which screenings and vaccines you need and how often you need them. This information is not intended to replace advice given to you by your health care provider. Make sure you discuss any questions you have with your health care provider. Document Released: 01/18/2015 Document Revised: 09/11/2015 Document Reviewed: 10/23/2014 Elsevier Interactive Patient Education  2017 [place] Prevention in the Home Falls can cause injuries. They can happen to people of all ages. There are many things you can do to make your home safe and to help prevent falls. What can I do on the outside of my home?  Regularly fix the edges of walkways and driveways and fix any cracks.  Remove anything that might make you trip as you walk through a door, such as a raised step or threshold.  Trim any bushes or trees on the path to your home.  Use bright outdoor lighting.  Clear any walking paths of anything that might make someone trip, such as rocks or tools.  Regularly check to see if handrails are loose or broken. Make sure that both sides of any steps have handrails.  Any raised decks and porches should have guardrails on the edges.  Have any leaves, snow, or ice cleared regularly.  Use sand or salt on walking paths during winter.  Clean up any spills in your garage right away. This includes oil or grease spills. What can I do in the bathroom?  Use night lights.  Install grab bars by the toilet and in the tub and shower. Do not use towel bars as grab bars.  Use non-skid mats or decals in the tub or shower.  If you need to sit down in the shower, use a plastic, non-slip stool.  Keep the floor dry. Clean up any  water that spills on the floor as soon as it happens.  Remove soap buildup in the tub or shower regularly.  Attach bath mats securely with double-sided non-slip rug tape.  Do not have throw rugs and other things on the floor that can make you trip. What can I do in the bedroom?  Use night lights.  Make sure that you have a light by your bed that is easy to reach.  Do not use any sheets or blankets that are too big for your bed. They should not hang down onto the floor.  Have a firm chair that has side arms. You can use this for support while you get dressed.  Do not have throw rugs and other things on the floor that can make you trip. What can I do in the kitchen?  Clean up any spills right away.  Avoid walking on wet floors.  Keep items that you use a lot in easy-to-reach places.  If you need to reach something above you, use a strong step stool that has a grab bar.  Keep electrical cords out of the way.  Do not use floor polish or wax that makes floors slippery. If you must use wax,  use non-skid floor wax.  Do not have throw rugs and other things on the floor that can make you trip. What can I do with my stairs?  Do not leave any items on the stairs.  Make sure that there are handrails on both sides of the stairs and use them. Fix handrails that are broken or loose. Make sure that handrails are as long as the stairways.  Check any carpeting to make sure that it is firmly attached to the stairs. Fix any carpet that is loose or worn.  Avoid having throw rugs at the top or bottom of the stairs. If you do have throw rugs, attach them to the floor with carpet tape.  Make sure that you have a light switch at the top of the stairs and the bottom of the stairs. If you do not have them, ask someone to add them for you. What else can I do to help prevent falls?  Wear shoes that:  Do not have high heels.  Have rubber bottoms.  Are comfortable and fit you well.  Are closed  at the toe. Do not wear sandals.  If you use a stepladder:  Make sure that it is fully opened. Do not climb a closed stepladder.  Make sure that both sides of the stepladder are locked into place.  Ask someone to hold it for you, if possible.  Clearly mark and make sure that you can see:  Any grab bars or handrails.  First and last steps.  Where the edge of each step is.  Use tools that help you move around (mobility aids) if they are needed. These include:  Canes.  Walkers.  Scooters.  Crutches.  Turn on the lights when you go into a dark area. Replace any light bulbs as soon as they burn out.  Set up your furniture so you have a clear path. Avoid moving your furniture around.  If any of your floors are uneven, fix them.  If there are any pets around you, be aware of where they are.  Review your medicines with your doctor. Some medicines can make you feel dizzy. This can increase your chance of falling. Ask your doctor what other things that you can do to help prevent falls. This information is not intended to replace advice given to you by your health care provider. Make sure you discuss any questions you have with your health care provider. Document Released: 10/18/2008 Document Revised: 05/30/2015 Document Reviewed: 01/26/2014 Elsevier Interactive Patient Education  2017 [employer].

## 2017-07-21 NOTE — Progress Notes (Signed)
Subjective:   Deanna Bird is a 84 y.o. female who presents for Medicare Annual (Subsequent) preventive examination.  Review of Systems:   Cardiac Risk Factors include: advanced age (>45men, >44 women);dyslipidemia;hypertension;diabetes mellitus     Objective:     Vitals: BP 118/64 (BP Location: Left Arm, Patient Position: Sitting)   Pulse 69   Temp 98.3 F (36.8 C) (Temporal)   Resp 16   Ht 5\' 3"  (1.6 m)   Wt 124 lb 9.6 oz (56.5 kg)   BMI 22.07 kg/m   Body mass index is 22.07 kg/m.  Advanced Directives 07/21/2017 07/01/2017 11/30/2016 11/19/2016 07/15/2016 04/01/2016 04/01/2016  Does Patient Have a Medical Advance Directive? Yes Yes Yes Yes Yes Yes -  Type of Advance Directive Living will;Healthcare Power of [place];Living will [place];Living will [place];Living will Healthcare Power of [place]  Does patient want to make changes to medical advance directive? - No - Patient declined No - Patient declined No - Patient declined - No - Patient declined -  Copy of [place] in Chart? - - No - copy requested No - copy requested Yes Yes Yes    Tobacco Social History   Tobacco Use  Smoking Status Never Smoker  Smokeless Tobacco Never Used     Counseling given: Not Answered   Clinical Intake:  Pre-visit preparation completed: Yes  Pain : No/denies pain     Nutritional Status: BMI of 19-24  Normal Nutritional Risks: Unintentional weight loss Diabetes: Yes CBG done?: No Did pt. bring in CBG monitor from home?: No  How often do you need to have someone help you when you read instructions, pamphlets, or other written materials from your doctor or pharmacy?: 1 - Never What is the last grade level you completed in school?: 12th grade  Interpreter Needed?: No  Information entered by :: Ilka Lovick,LPN   Past Medical History:  Diagnosis Date  .  Achalasia   . Barrett's esophagus with dysplasia   . Diabetes mellitus without complication ([place])   . GERD (gastroesophageal reflux disease)   . Hyperlipidemia   . Hypertension   . MRSA infection    lung  . Thyroid disease    Past Surgical History:  Procedure Laterality Date  . ABDOMINAL HYSTERECTOMY    . achalasia    . APPENDECTOMY    . HIATAL HERNIA REPAIR    . KNEE ARTHROPLASTY Right 04/01/2016   Procedure: COMPUTER ASSISTED TOTAL KNEE ARTHROPLASTY;  Surgeon: Dereck Leep, MD;  Location: [facility] ORS;  Service: Orthopedics;  Laterality: Right;  . KNEE ARTHROPLASTY Left 11/30/2016   Procedure: COMPUTER ASSISTED TOTAL KNEE ARTHROPLASTY;  Surgeon: Dereck Leep, MD;  Location: [facility] ORS;  Service: Orthopedics;  Laterality: Left;  . PARATHYROIDECTOMY    . THYROID EXPLORATION    . TONSILLECTOMY     Family History  Problem Relation Age of Onset  . Heart disease Mother    Social History   Socioeconomic History  . Marital status: Married    Spouse name: Not on file  . Number of children: Not on file  . Years of education: 41  . Highest education level: High school graduate  Occupational History  . Not on file  Social Needs  . Financial resource strain: Not hard at all  . Food insecurity:    Worry: Never true    Inability: Never true  . Transportation needs:  Medical: No    Non-medical: No  Tobacco Use  . Smoking status: Never Smoker  . Smokeless tobacco: Never Used  Substance and Sexual Activity  . Alcohol use: No  . Drug use: No  . Sexual activity: Not on file  Lifestyle  . Physical activity:    Days per week: 0 days    Minutes per session: 0 min  . Stress: Not at all  Relationships  . Social connections:    Talks on phone: More than three times a week    Gets together: More than three times a week    Attends religious service: More than 4 times per year    Active member of club or organization: No    Attends meetings of clubs or organizations: Never     Relationship status: Married  Other Topics Concern  . Not on file  Social History Narrative  . Not on file    Outpatient Encounter Medications as of 07/21/2017  Medication Sig  . Alpha-Lipoic Acid 200 MG TABS Take 200 mg by mouth 2 (two) times daily.   . Cholecalciferol (VITAMIN D-3) 5000 units TABS Take 5,000 Units daily with supper by mouth.   . docusate sodium (COLACE) 100 MG capsule Take 200 mg by mouth at bedtime.  . Fenofibric Acid 105 MG TABS Take 1 tablet (105 mg total) by mouth daily.  Marland Kitchen glimepiride (AMARYL) 1 MG tablet Take by mouth.  . losartan-hydrochlorothiazide (HYZAAR) 100-25 MG tablet Take 1 tablet by mouth daily.  . Melatonin 1 MG CAPS Take 1 capsule at bedtime as needed by mouth.  . metoprolol succinate (TOPROL-XL) 100 MG 24 hr tablet Take 1 tablet (100 mg total) by mouth daily. Take with or immediately following a meal.  . Multiple Vitamin (MULTIVITAMINS PO) Take 1 packet by mouth 2 (two) times daily. Forward Gold Daily Regimen (multivitamin/bone&balance/omega 3) By Dr. Lanell Matar  . Probiotic Product (PROBIOTIC PO) Take 1 capsule by mouth daily before breakfast.  . PROCTO-MED HC 2.5 % rectal cream Place 1 application rectally 2 (two) times daily.  Marland Kitchen glimepiride (AMARYL) 1 MG tablet Take 1 tablet (1 mg total) by mouth daily with breakfast. (Patient not taking: Reported on 07/21/2017)   No facility-administered encounter medications on file as of 07/21/2017.     Activities of Daily Living In your present state of health, do you have any difficulty performing the following activities: 07/21/2017 11/30/2016  Hearing? N N  Vision? N N  Difficulty concentrating or making decisions? N N  Walking or climbing stairs? Y Y  Dressing or bathing? N Y  Doing errands, shopping? Y N  Preparing Food and eating ? N -  Using the Toilet? N -  In the past six months, have you accidently leaked urine? N -  Do you have problems with loss of bowel control? N -  Managing your Medications?  N -  Managing your Finances? N -  Housekeeping or managing your Housekeeping? N -  Some recent data might be hidden    Patient Care Team: Guadalupe Maple, MD as PCP - General (Family Medicine)    Assessment:   This is a routine wellness examination for Silas.  Exercise Activities and Dietary recommendations Current Exercise Habits: Structured exercise class(PT), Type of exercise: strength training/weights, Time (Minutes): 45, Frequency (Times/Week): 2, Weekly Exercise (Minutes/Week): 90, Intensity: Mild, Exercise limited by: neurologic condition(s)  Goals    . DIET - INCREASE WATER INTAKE     Recommend drinking at least 6-8 glasses  of water a day        Fall Risk Fall Risk  07/21/2017 02/04/2017 07/15/2016 02/05/2016 11/19/2014  Falls in the past year? Yes No No Yes No  Number falls in past yr: 2 or more - - 2 or more -  Injury with Fall? Yes - - No -  Risk Factor Category  High Fall Risk - - - -  Risk for fall due to : History of fall(s) - - - -  Follow up Falls evaluation completed;Falls prevention discussed;Education provided - - Falls evaluation completed -   Is the patient's home free of loose throw rugs in walkways, pet beds, electrical cords, etc?   yes      Grab bars in the bathroom? yes      Handrails on the stairs?   yes      Adequate lighting?   yes  Timed Get Up and Go performed: Completed in 8 seconds with no use of assistive devices, steady gait. No intervention needed at this time.   Depression Screen PHQ 2/9 Scores 07/21/2017 02/04/2017 07/15/2016 11/19/2014  PHQ - 2 Score 2 0 0 1  PHQ- 9 Score 6 - - -     Cognitive Function     6CIT Screen 07/21/2017 07/15/2016  What Year? 0 points 0 points  What month? 0 points 0 points  What time? 0 points 0 points  Count back from 20 0 points 0 points  Months in reverse 0 points 0 points  Repeat phrase 2 points 0 points  Total Score 2 0    Immunization History  Administered Date(s) Administered  .  Influenza,inj,Quad PF,6+ Mos 11/19/2014  . Influenza,inj,quad, With Preservative 11/20/2016  . Influenza-Unspecified 10/17/2015, 11/18/2016  . Pneumococcal Conjugate-13 06/12/2013  . Pneumococcal-Unspecified 09/06/1995, 02/05/2001  . Td 04/01/2006  . Zoster 02/11/2009    Qualifies for Shingles Vaccine? Yes, discussed shingrix vaccine   Screening Tests Health Maintenance  Topic Date Due  . FOOT EXAM  11/19/2015  . TETANUS/TDAP  03/31/2016  . HEMOGLOBIN A1C  08/04/2017  . INFLUENZA VACCINE  08/05/2017  . OPHTHALMOLOGY EXAM  10/01/2017  . DEXA SCAN  Completed  . PNA vac Low Risk Adult  Completed    Cancer Screenings: Lung: Low Dose CT Chest recommended if Age 20-80 years, 30 pack-year currently smoking OR have quit w/in 15years. Patient does not qualify. Breast:  Up to date on Mammogram? Yes  No longer required Up to date of Bone Density/Dexa? Yes no longer required Colorectal: no longer required  Additional Screenings:  Hepatitis C Screening: not indicated      Plan:    I have personally reviewed and addressed the Medicare Annual Wellness questionnaire and have noted the following in the patient's chart:  A. Medical and social history B. Use of alcohol, tobacco or illicit drugs  C. Current medications and supplements D. Functional ability and status E.  Nutritional status F.  Physical activity G. Advance directives H. List of other physicians I.  Hospitalizations, surgeries, and ER visits in previous 12 months J.  [place] such as hearing and vision if needed, cognitive and depression L. Referrals and appointments   In addition, I have reviewed and discussed with patient certain preventive protocols, quality metrics, and best practice recommendations. A written personalized care plan for preventive services as well as general preventive health recommendations were provided to patient.   Signed,  Tyler Aas, LPN Nurse Health Advisor   Nurse Notes:  due for diabetic foot exam-  cpe 08/10/2017 with Dr.Crissman.    Patient has lost over 20 pounds in the last 5 months. States nothing sounds good, it all sounds to heavy. No appetite.  Has been very fatigued lately. She is currently doing PT twice a week and feels she has no energy.

## 2017-07-22 ENCOUNTER — Ambulatory Visit: Payer: PPO | Admitting: Physical Therapy

## 2017-07-26 ENCOUNTER — Ambulatory Visit: Payer: PPO | Admitting: Physical Therapy

## 2017-07-26 ENCOUNTER — Encounter: Payer: Self-pay | Admitting: Physical Therapy

## 2017-07-26 DIAGNOSIS — R2681 Unsteadiness on feet: Secondary | ICD-10-CM

## 2017-07-26 DIAGNOSIS — M6281 Muscle weakness (generalized): Secondary | ICD-10-CM

## 2017-07-26 DIAGNOSIS — R2689 Other abnormalities of gait and mobility: Secondary | ICD-10-CM

## 2017-07-26 NOTE — Therapy (Signed)
[place] [facility] SERVICES [address] [place], [place], [address] Phone: [phone]   Fax:  [phone]  Physical Therapy Treatment  Patient Details  Name: Deanna Bird MRN: [id number] Date of Birth: 1928-12-08 Referring Provider: Vladimir Crofts, MD    Encounter Date: 07/26/2017  PT End of Session - 07/26/17 1527    Visit Number  5    Number of Visits  17    Date for PT Re-Evaluation  08/26/17    PT Start Time  0320    PT Stop Time  0400    PT Time Calculation (min)  40 min    Equipment Utilized During Treatment  Gait belt    Activity Tolerance  Patient tolerated treatment well;Patient limited by fatigue    Behavior During Therapy  [facility] for tasks assessed/performed       Past Medical History:  Diagnosis Date  . Achalasia   . Barrett's esophagus with dysplasia   . Diabetes mellitus without complication ([place])   . GERD (gastroesophageal reflux disease)   . Hyperlipidemia   . Hypertension   . MRSA infection    lung  . Thyroid disease     Past Surgical History:  Procedure Laterality Date  . ABDOMINAL HYSTERECTOMY    . achalasia    . APPENDECTOMY    . HIATAL HERNIA REPAIR    . KNEE ARTHROPLASTY Right 04/01/2016   Procedure: COMPUTER ASSISTED TOTAL KNEE ARTHROPLASTY;  Surgeon: Dereck Leep, MD;  Location: [facility] ORS;  Service: Orthopedics;  Laterality: Right;  . KNEE ARTHROPLASTY Left 11/30/2016   Procedure: COMPUTER ASSISTED TOTAL KNEE ARTHROPLASTY;  Surgeon: Dereck Leep, MD;  Location: [facility] ORS;  Service: Orthopedics;  Laterality: Left;  . PARATHYROIDECTOMY    . THYROID EXPLORATION    . TONSILLECTOMY      There were no vitals filed for this visit.  Subjective Assessment - 07/26/17 1526    Subjective  Patient is doing well and is not having any pain today.     Pertinent History  Pt is a 84 y/o F who reports she has been diagnosed with spinal stenosis but that she is here for her balance.  Pt reports 3 falls in the  past month with one of them occurring yesterday and a total of 4 falls in the past 6 months.  Pt says she is weak in her LLE, causing her to pull on the rail when going up steps, placing a lot of weight through her RUE making her R arm hurt.  Pt denies weakness in LUE. Pt ambulates with her hurry cane at all times. Pt believes her imbalance began in 2017.  Goals: "to be able to walk without feeling like I am going to stumble", working in the yard without losing her balance. Pt reports pain in L buttocks with sit>stand and standing, no pain in sitting. Pain for several months on and off, currently on for 3-4 days. Says it started after sitting on pillow in car.  Pt says she is constipated and is taking dulcolax for this, pt denies night sweats and saddle anesthesia. Pt reports she is tired all of the time and feels "washed out" but denies suicidal thoughts, feeling down, or feeling depressed.  Told pt this therapist would like to notify pt's MD of pt's feeling of "washed out" but pt politely requests for pt to let MD know during her appointment on 82/12. Pt has lost 50 lbs over the course of 2 years (  Dr. Manuella Ghazi aware). Pt contributes this to pneumonia and being on antibiotics. EMG on 3/29 with Impression: "This is an abnormal electrodiagnostic study consistent with a 1) generalized polyneuropathy. 2) superimposed chronic L5 radiculopathy."     Limitations  Lifting;Standing;Walking;House hold activities    How long can you stand comfortably?  10 minutes without UE support    How long can you walk comfortably?  household distances due to fatigue    Diagnostic tests  MRI on 03/29/17: "L4-5 severe facet arthropathy with anterolisthesis. Spinal stenosis is advanced and there is left L4 foraminal impingement. Elsewhere mild for age degenerative changes as described above. Pt had EMG done on 3/29: "EMG & NCV Findings: Evaluation of the Left peroneal motor nerve showed no response (Ankle) and no response (Ankle). The Left  tibial motor nerve showed prolonged distal onset latency (7.0 ms) and reduced amplitude (1.0 mV). The Left ulnar motor nerve showed prolonged distal onset latency (5.0 ms). The Left radial sensory nerve showed prolonged distal peak latency (2.6 ms). The Left superficial peroneal sensory nerve showed no response (14 cm). The Left sural sensory nerve showed no response (Calf). The Left ulnar sensory nerve showed prolonged distal peak latency (3.8 ms) and decreased conduction velocity (Wrist-5th Digit, 32 m/s)".     Patient Stated Goals  see above    Currently in Pain?  No/denies    Multiple Pain Sites  No         Therapeutic exercise and neuromuscular training: foam stand balance with head turns left and right feet apart and feet together,cues for better posture Side stepping floor left and right x 10 lengths, cues for going slowly and she needs UE support with LOB backwards modified tandem standing foam  , cues for better posture  standing hip abd with YTB x 20  , cues to keep her shoulders upright  side stepping left and right YTB in parallel bars 10 feet x 3 step ups from floor to 6 inch stool x 20 bilateral marching in parallel bars x 20, cues for bigger steps Stepping over 1/2 foam left and right and fwd/bwd, needs UE support with LOB backwards   min assist and Min  verbal cues used throughout with increased in postural sway and LOB most seen with narrow base of support and while on uneven surfaces. Continues to have balance deficits typical with diagnosis. Patient performs intermediate level exercises without pain behaviors and needs verbal cuing for postural alignment and head positioning                     PT Education - 07/26/17 1526    Education Details  HEP    Person(s) Educated  Patient    Methods  Explanation    Comprehension  Verbalized understanding       PT Short Term Goals - 07/01/17 1611      PT SHORT TERM GOAL #1   Title  Pt will independent complete  HEP at least 4 days/wk for carryover between sessions    Time  2    Period  Weeks    Status  New        PT Long Term Goals - 07/01/17 1612      PT LONG TERM GOAL #1   Title  [name] 5xSTS will improve to equal to or less than 16 seconds without UE assist to demonstrate improved BLE strength    Baseline  24.98 seconds with UE assist    Time  8  Period  Weeks    Status  New      PT LONG TERM GOAL #2   Title  [name] 7mWT time will improve to equal to or greater than 0.9 m/s to demonstrate improved safety ambulating in the community    Baseline  0.50 m/s     Time  6    Period  Weeks    Status  New      PT LONG TERM GOAL #3   Title  [name] Berg Balance Test score will improve to equal to or greater than 44/56 to demonstrate decreased likelihood of falling    Baseline  32/56    Time  8    Period  Weeks    Status  New      PT LONG TERM GOAL #4   Title  Pt will be able to walk at least 200 ft without a rest break using SPC to demonstrate improved ambulatory endurance    Baseline  Pt limited to ambulating household distances due to fatigue    Time  7    Period  Weeks    Status  New            Plan - 07/26/17 1528    Clinical Impression Statement  Patient instructed in intermediate strengthening and balance exercise.  Patient requires min Vcs for correct exercise technique including to improve LE control with standing exercise. Patient demonstrates better quad control with SLS tasks with rail assist. Patient would benefit from additional skilled PT intervention to improve balance/gait safety and reduce fall risk.  (Pended)     Rehab Potential  Good  (Pended)     PT Frequency  2x / week  (Pended)     PT Duration  8 weeks  (Pended)     PT Treatment/Interventions  ADLs/Self Care Home Management;Aquatic Therapy;Biofeedback;Cryotherapy;Electrical Stimulation;Iontophoresis 4mg /ml Dexamethasone;Moist Heat;Traction;Ultrasound;DME Instruction;Contrast Bath;Gait training;Stair  training;Functional mobility training;Therapeutic activities;Therapeutic exercise;Balance training;Neuromuscular re-education;Patient/family education;Orthotic Fit/Training;Manual techniques;Compression bandaging;Passive range of motion;Dry needling;Energy conservation;Taping  (Pended)     PT Next Visit Plan  Semmes-Wienstein testing and discuss checking feet regularly, consider AFO in the future, balance and strength training, gait training, treatment for L hamstring strain   (Pended)     PT Home Exercise Plan  sit<>stand with UE assist, tandem stance, romberg with horizontal head turns, marching in standing  (Pended)     Consulted and Agree with Plan of Care  Patient  (Pended)        Patient will benefit from skilled therapeutic intervention in order to improve the following deficits and impairments:  (P) Abnormal gait, Decreased activity tolerance, Decreased balance, Decreased endurance, Decreased knowledge of use of DME, Decreased mobility, Decreased range of motion, Decreased safety awareness, Decreased strength, Difficulty walking, Hypomobility, Increased fascial restricitons, Impaired perceived functional ability, Increased muscle spasms, Impaired flexibility, Impaired sensation, Improper body mechanics, Postural dysfunction, Pain  Visit Diagnosis: Unsteadiness on feet  Other abnormalities of gait and mobility  Muscle weakness (generalized)     Problem List Patient Active Problem List   Diagnosis Date Noted  . Advanced care planning/counseling discussion 08/04/2016  . S/P total knee arthroplasty 04/01/2016  . Primary osteoarthritis of right knee 03/19/2016  . Hyperlipidemia 11/19/2014  . Diabetes mellitus without complication ([place]) 41/66/0630  . Nonspecific abnormal results of thyroid function study 07/18/2014  . Hypertension 07/18/2014    Alanson Puls, PT DPT 07/26/2017, 3:54 PM  [place] [facility] SERVICES [place], [place], [address]  Phone: [phone]   Fax:  [phone]  Name: COURTNI BALASH MRN: [id number] Date of Birth: 31-Oct-1928

## 2017-07-28 ENCOUNTER — Encounter: Payer: Self-pay | Admitting: Physical Therapy

## 2017-07-28 ENCOUNTER — Ambulatory Visit: Payer: PPO | Admitting: Physical Therapy

## 2017-07-28 DIAGNOSIS — R2681 Unsteadiness on feet: Secondary | ICD-10-CM | POA: Diagnosis not present

## 2017-07-28 DIAGNOSIS — R2689 Other abnormalities of gait and mobility: Secondary | ICD-10-CM

## 2017-07-28 DIAGNOSIS — M6281 Muscle weakness (generalized): Secondary | ICD-10-CM

## 2017-07-28 NOTE — Therapy (Signed)
[place] MAIN [facility] SERVICES [address] [place], [place], [address] Phone: [phone]   Fax:  [phone]  Physical Therapy Treatment  Patient Details  Name: Deanna Bird MRN: [id number] Date of Birth: 1928-08-06 Referring Provider: Vladimir Crofts, MD    Encounter Date: 07/28/2017  PT End of Session - 07/28/17 1528    Visit Number  6    Number of Visits  17    Date for PT Re-Evaluation  08/26/17    PT Start Time  0320    PT Stop Time  0400    PT Time Calculation (min)  40 min    Equipment Utilized During Treatment  Gait belt    Activity Tolerance  Patient tolerated treatment well;Patient limited by fatigue    Behavior During Therapy  [facility] for tasks assessed/performed       Past Medical History:  Diagnosis Date  . Achalasia   . Barrett's esophagus with dysplasia   . Diabetes mellitus without complication ([place])   . GERD (gastroesophageal reflux disease)   . Hyperlipidemia   . Hypertension   . MRSA infection    lung  . Thyroid disease     Past Surgical History:  Procedure Laterality Date  . ABDOMINAL HYSTERECTOMY    . achalasia    . APPENDECTOMY    . HIATAL HERNIA REPAIR    . KNEE ARTHROPLASTY Right 04/01/2016   Procedure: COMPUTER ASSISTED TOTAL KNEE ARTHROPLASTY;  Surgeon: Dereck Leep, MD;  Location: [facility] ORS;  Service: Orthopedics;  Laterality: Right;  . KNEE ARTHROPLASTY Left 11/30/2016   Procedure: COMPUTER ASSISTED TOTAL KNEE ARTHROPLASTY;  Surgeon: Dereck Leep, MD;  Location: [facility] ORS;  Service: Orthopedics;  Laterality: Left;  . PARATHYROIDECTOMY    . THYROID EXPLORATION    . TONSILLECTOMY      There were no vitals filed for this visit.  Subjective Assessment - 07/28/17 1528    Subjective  Patient is doing well and is not having any pain today.     Pertinent History  Pt is a 84 y/o F who reports she has been diagnosed with spinal stenosis but that she is here for her balance.  Pt reports 3 falls in the  past month with one of them occurring yesterday and a total of 4 falls in the past 6 months.  Pt says she is weak in her LLE, causing her to pull on the rail when going up steps, placing a lot of weight through her RUE making her R arm hurt.  Pt denies weakness in LUE. Pt ambulates with her hurry cane at all times. Pt believes her imbalance began in 2017.  Goals: "to be able to walk without feeling like I am going to stumble", working in the yard without losing her balance. Pt reports pain in L buttocks with sit>stand and standing, no pain in sitting. Pain for several months on and off, currently on for 3-4 days. Says it started after sitting on pillow in car.  Pt says she is constipated and is taking dulcolax for this, pt denies night sweats and saddle anesthesia. Pt reports she is tired all of the time and feels "washed out" but denies suicidal thoughts, feeling down, or feeling depressed.  Told pt this therapist would like to notify pt's MD of pt's feeling of "washed out" but pt politely requests for pt to let MD know during her appointment on 7/12. Pt has lost 50 lbs over the course of 2 years (  Dr. Manuella Ghazi aware). Pt contributes this to pneumonia and being on antibiotics. EMG on 3/29 with Impression: "This is an abnormal electrodiagnostic study consistent with a 1) generalized polyneuropathy. 2) superimposed chronic L5 radiculopathy."     Limitations  Lifting;Standing;Walking;House hold activities    How long can you stand comfortably?  10 minutes without UE support    How long can you walk comfortably?  household distances due to fatigue    Diagnostic tests  MRI on 03/29/17: "L4-5 severe facet arthropathy with anterolisthesis. Spinal stenosis is advanced and there is left L4 foraminal impingement. Elsewhere mild for age degenerative changes as described above. Pt had EMG done on 3/29: "EMG & NCV Findings: Evaluation of the Left peroneal motor nerve showed no response (Ankle) and no response (Ankle). The Left  tibial motor nerve showed prolonged distal onset latency (7.0 ms) and reduced amplitude (1.0 mV). The Left ulnar motor nerve showed prolonged distal onset latency (5.0 ms). The Left radial sensory nerve showed prolonged distal peak latency (2.6 ms). The Left superficial peroneal sensory nerve showed no response (14 cm). The Left sural sensory nerve showed no response (Calf). The Left ulnar sensory nerve showed prolonged distal peak latency (3.8 ms) and decreased conduction velocity (Wrist-5th Digit, 32 m/s)".     Patient Stated Goals  see above    Currently in Pain?  No/denies    Multiple Pain Sites  No       Therapeutic exercise and neuromuscular training: foam standing  and balance with head turns left and right feet apart and feet together,cues for better posture Side stepping on blue balance beam left and right x 10 lengths, cues for going slowly and she ups from floor to 6 inch stool x 20 bilateral marching in parallel bars x 20, cues for bigger steps, needs UE support with LOB backwards  tandem standing on floor  , cues for better posture  standing hip abd with YTB x 20  , cues to keep her shoulders upright  side stepping left and right YTB in parallel bars 10 feet x 3 Tilt board fwd/bwd, side to side left and right, needs UE support Stepping over hurdle  left and right and fwd/bwd x 15 x 2 , needs UE support with LOB backwards Sorting balls on foam feet together and modified tandem  Leg press 90 lbs x 20 x 2 Heel raises x 20 x 2  Squats with 5 sec hold x 10                         PT Education - 07/28/17 1528    Education Details  HEP    Person(s) Educated  Patient    Methods  Explanation;Verbal cues    Comprehension  Verbalized understanding;Returned demonstration;Tactile cues required       PT Short Term Goals - 07/01/17 1611      PT SHORT TERM GOAL #1   Title  Pt will independent complete HEP at least 4 days/wk for carryover between sessions    Time  2     Period  Weeks    Status  New        PT Long Term Goals - 07/01/17 1612      PT LONG TERM GOAL #1   Title  [name] 5xSTS will improve to equal to or less than 16 seconds without UE assist to demonstrate improved BLE strength    Baseline  24.98 seconds with UE assist  Time  8    Period  Weeks    Status  New      PT LONG TERM GOAL #2   Title  [name] 75mWT time will improve to equal to or greater than 0.9 m/s to demonstrate improved safety ambulating in the community    Baseline  0.50 m/s     Time  6    Period  Weeks    Status  New      PT LONG TERM GOAL #3   Title  [name] Berg Balance Test score will improve to equal to or greater than 44/56 to demonstrate decreased likelihood of falling    Baseline  32/56    Time  8    Period  Weeks    Status  New      PT LONG TERM GOAL #4   Title  Pt will be able to walk at least 200 ft without a rest break using SPC to demonstrate improved ambulatory endurance    Baseline  Pt limited to ambulating household distances due to fatigue    Time  7    Period  Weeks    Status  New            Plan - 07/28/17 1535    Clinical Impression Statement  Pt was able to perform all exercises today with CGA.Marland Kitchen Pt was able to perform all balance and strength exercises, demonstrating improvements in LE strength and stability.  Pt was able to complete dynamic balance exercises, showing ability to stand on uneven surfaces without LOB, min asssit and improve postural reactions to correct self during activities.  Pt requires verbal, visual and tactile cues during exercise in order to complete tasks with proper form and technique, as well as to stay on task.  Pt would continue to benefit from skilled PT services in order to further strengthen LE's, improve static and dynamic balance, and improve coordination in order to increase functional mobility and decrease risk of falls    Rehab Potential  Good    PT Frequency  2x / week    PT Duration  8 weeks    PT  Treatment/Interventions  ADLs/Self Care Home Management;Aquatic Therapy;Biofeedback;Cryotherapy;Electrical Stimulation;Iontophoresis 4mg /ml Dexamethasone;Moist Heat;Traction;Ultrasound;DME Instruction;Contrast Bath;Gait training;Stair training;Functional mobility training;Therapeutic activities;Therapeutic exercise;Balance training;Neuromuscular re-education;Patient/family education;Orthotic Fit/Training;Manual techniques;Compression bandaging;Passive range of motion;Dry needling;Energy conservation;Taping    PT Next Visit Plan  Semmes-Wienstein testing and discuss checking feet regularly, consider AFO in the future, balance and strength training, gait training, treatment for L hamstring strain     PT Home Exercise Plan  sit<>stand with UE assist, tandem stance, romberg with horizontal head turns, marching in standing    Consulted and Agree with Plan of Care  Patient       Patient will benefit from skilled therapeutic intervention in order to improve the following deficits and impairments:  Abnormal gait, Decreased activity tolerance, Decreased balance, Decreased endurance, Decreased knowledge of use of DME, Decreased mobility, Decreased range of motion, Decreased safety awareness, Decreased strength, Difficulty walking, Hypomobility, Increased fascial restricitons, Impaired perceived functional ability, Increased muscle spasms, Impaired flexibility, Impaired sensation, Improper body mechanics, Postural dysfunction, Pain  Visit Diagnosis: Unsteadiness on feet  Other abnormalities of gait and mobility  Muscle weakness (generalized)     Problem List Patient Active Problem List   Diagnosis Date Noted  . Advanced care planning/counseling discussion 08/04/2016  . S/P total knee arthroplasty 04/01/2016  . Primary osteoarthritis of right knee 03/19/2016  . Hyperlipidemia 11/19/2014  . Diabetes mellitus  without complication ([place]) 82/88/3374  . Nonspecific abnormal results of thyroid function study  07/18/2014  . Hypertension 07/18/2014    Alanson Puls, PT DPT 07/28/2017, 3:44 PM  [place] MAIN [facility] SERVICES [address] [place], [place], [address] Phone: [phone]   Fax:  [phone]  Name: Deanna Bird MRN: [id number] Date of Birth: [birth year]

## 2017-08-03 ENCOUNTER — Ambulatory Visit: Payer: PPO | Admitting: Physical Therapy

## 2017-08-03 ENCOUNTER — Encounter: Payer: Self-pay | Admitting: Physical Therapy

## 2017-08-03 DIAGNOSIS — M6281 Muscle weakness (generalized): Secondary | ICD-10-CM

## 2017-08-03 DIAGNOSIS — R2681 Unsteadiness on feet: Secondary | ICD-10-CM

## 2017-08-03 DIAGNOSIS — R2689 Other abnormalities of gait and mobility: Secondary | ICD-10-CM

## 2017-08-03 NOTE — Therapy (Signed)
[place] MAIN [facility] SERVICES [address] [place], [place], [address] Phone: [phone]   Fax:  [phone]  Physical Therapy Treatment  Patient Details  Name: Deanna Bird MRN: [id number] Date of Birth: [birth year] Referring Provider: Vladimir Crofts, MD    Encounter Date: 08/03/2017  PT End of Session - 08/03/17 1354    Visit Number  7    Number of Visits  17    Date for PT Re-Evaluation  08/26/17    PT Start Time  1346    PT Stop Time  1430    PT Time Calculation (min)  44 min    Equipment Utilized During Treatment  Gait belt    Activity Tolerance  Patient tolerated treatment well;Patient limited by fatigue    Behavior During Therapy  [facility] for tasks assessed/performed       Past Medical History:  Diagnosis Date  . Achalasia   . Barrett's esophagus with dysplasia   . Diabetes mellitus without complication ([place])   . GERD (gastroesophageal reflux disease)   . Hyperlipidemia   . Hypertension   . MRSA infection    lung  . Thyroid disease     Past Surgical History:  Procedure Laterality Date  . ABDOMINAL HYSTERECTOMY    . achalasia    . APPENDECTOMY    . HIATAL HERNIA REPAIR    . KNEE ARTHROPLASTY Right 04/01/2016   Procedure: COMPUTER ASSISTED TOTAL KNEE ARTHROPLASTY;  Surgeon: Dereck Leep, MD;  Location: [facility] ORS;  Service: Orthopedics;  Laterality: Right;  . KNEE ARTHROPLASTY Left 11/30/2016   Procedure: COMPUTER ASSISTED TOTAL KNEE ARTHROPLASTY;  Surgeon: Dereck Leep, MD;  Location: [facility] ORS;  Service: Orthopedics;  Laterality: Left;  . PARATHYROIDECTOMY    . THYROID EXPLORATION    . TONSILLECTOMY      There were no vitals filed for this visit.  Subjective Assessment - 08/03/17 1353    Subjective  Patient reports having increased left tailbone soreness. She reports taking an oxycotin this morning and hopes that it gets better.     Pertinent History  Pt is a 84 y/o F who reports she has been diagnosed with spinal  stenosis but that she is here for her balance.  Pt reports 3 falls in the past month with one of them occurring yesterday and a total of 4 falls in the past 6 months.  Pt says she is weak in her LLE, causing her to pull on the rail when going up steps, placing a lot of weight through her RUE making her R arm hurt.  Pt denies weakness in LUE. Pt ambulates with her hurry cane at all times. Pt believes her imbalance began in 2017.  Goals: "to be able to walk without feeling like I am going to stumble", working in the yard without losing her balance. Pt reports pain in L buttocks with sit>stand and standing, no pain in sitting. Pain for several months on and off, currently on for 3-4 days. Says it started after sitting on pillow in car.  Pt says she is constipated and is taking dulcolax for this, pt denies night sweats and saddle anesthesia. Pt reports she is tired all of the time and feels "washed out" but denies suicidal thoughts, feeling down, or feeling depressed.  Told pt this therapist would like to notify pt's MD of pt's feeling of "washed out" but pt politely requests for pt to let MD know during her appointment on 7/12. Pt has  lost 50 lbs over the course of 2 years (Dr. Manuella Ghazi aware). Pt contributes this to pneumonia and being on antibiotics. EMG on 3/29 with Impression: "This is an abnormal electrodiagnostic study consistent with a 1) generalized polyneuropathy. 2) superimposed chronic L5 radiculopathy."     Limitations  Lifting;Standing;Walking;House hold activities    How long can you stand comfortably?  10 minutes without UE support    How long can you walk comfortably?  household distances due to fatigue    Diagnostic tests  MRI on 03/29/17: "L4-5 severe facet arthropathy with anterolisthesis. Spinal stenosis is advanced and there is left L4 foraminal impingement. Elsewhere mild for age degenerative changes as described above. Pt had EMG done on 3/29: "EMG & NCV Findings: Evaluation of the Left peroneal  motor nerve showed no response (Ankle) and no response (Ankle). The Left tibial motor nerve showed prolonged distal onset latency (7.0 ms) and reduced amplitude (1.0 mV). The Left ulnar motor nerve showed prolonged distal onset latency (5.0 ms). The Left radial sensory nerve showed prolonged distal peak latency (2.6 ms). The Left superficial peroneal sensory nerve showed no response (14 cm). The Left sural sensory nerve showed no response (Calf). The Left ulnar sensory nerve showed prolonged distal peak latency (3.8 ms) and decreased conduction velocity (Wrist-5th Digit, 32 m/s)".     Patient Stated Goals  see above    Currently in Pain?  Yes    Pain Score  8     Pain Location  Coccyx    Pain Orientation  Left    Pain Descriptors / Indicators  Aching;Sore    Pain Type  Chronic pain    Pain Onset  More than a month ago    Pain Frequency  Intermittent    Aggravating Factors   unsure    Pain Relieving Factors  oxycotin/ice    Effect of Pain on Daily Activities  decreased activity tolerance;     Multiple Pain Sites  No       TREATMENT: Warm up on Nustep BUE/BLE level 2 x4 min (unbilled);  Standing on airex: -alternate toe taps to 6 inch step, with rail assist x10 bilaterally; -standing one foot on airex, one foot on 6 inch step with BUE ball pass side/side x5 reps each foot in front; -tandem stance, unsupported head turns side/side, up/down x5 reps each foot in front, min A for safety;  -heel/toe raises x15 with rail assist for safety;  Patient required min VCs for balance stability, including to increase trunk control for less loss of balance with smaller base of support   Exercise: Seated: Red tband around BLE: -hip flexion march x15 bilaterally;  -hip abduction/ER x15 bilaterally; Patient required min-moderate verbal/tactile cues for correct exercise technique.   Attempted leg press, but patient unable to tolerate;   Hooklying: Single knee to chest stretch 20 sec hold x2 reps   Bilaterally; Hamstring neural stretch with hip/knee flexion at 90 degrees 2x5 reps bilaterally; Hamstring stretch, SLR with ankle flossing to stretch nerve x10 reps bilaterally;   Patient reports slightly less discomfort at end of session to 6/10. She states, "I think its a little better."                    PT Education - 08/03/17 1354    Education Details  HEP, balance, gait safety;     Person(s) Educated  Patient    Methods  Explanation;Demonstration;Verbal cues    Comprehension  Verbalized understanding;Returned demonstration;Verbal cues required;Need further instruction  PT Short Term Goals - 07/01/17 1611      PT SHORT TERM GOAL #1   Title  Pt will independent complete HEP at least 4 days/wk for carryover between sessions    Time  2    Period  Weeks    Status  New        PT Long Term Goals - 07/01/17 1612      PT LONG TERM GOAL #1   Title  [name] 5xSTS will improve to equal to or less than 16 seconds without UE assist to demonstrate improved BLE strength    Baseline  24.98 seconds with UE assist    Time  8    Period  Weeks    Status  New      PT LONG TERM GOAL #2   Title  [name] 66mWT time will improve to equal to or greater than 0.9 m/s to demonstrate improved safety ambulating in the community    Baseline  0.50 m/s     Time  6    Period  Weeks    Status  New      PT LONG TERM GOAL #3   Title  [name] Berg Balance Test score will improve to equal to or greater than 44/56 to demonstrate decreased likelihood of falling    Baseline  32/56    Time  8    Period  Weeks    Status  New      PT LONG TERM GOAL #4   Title  Pt will be able to walk at least 200 ft without a rest break using SPC to demonstrate improved ambulatory endurance    Baseline  Pt limited to ambulating household distances due to fatigue    Time  7    Period  Weeks    Status  New            Plan - 08/03/17 1419    Clinical Impression Statement  Patient reports increased  LLE posterior leg soreness today which was worse with weight bearing tasks. Patient required short rest breaks to reduce discomfort. Patient instructed in LE stretches to reduce discomfort. She tolerated stretches well reporting less pain at end of session. Patient was able to exhibit less antalgic gait at end of session. She would benefit from additional skilled PT intervention to improve balance, gait safety and reduce pain with ADLs;     Rehab Potential  Good    PT Frequency  2x / week    PT Duration  8 weeks    PT Treatment/Interventions  ADLs/Self Care Home Management;Aquatic Therapy;Biofeedback;Cryotherapy;Electrical Stimulation;Iontophoresis 4mg /ml Dexamethasone;Moist Heat;Traction;Ultrasound;DME Instruction;Contrast Bath;Gait training;Stair training;Functional mobility training;Therapeutic activities;Therapeutic exercise;Balance training;Neuromuscular re-education;Patient/family education;Orthotic Fit/Training;Manual techniques;Compression bandaging;Passive range of motion;Dry needling;Energy conservation;Taping    PT Next Visit Plan  Semmes-Wienstein testing and discuss checking feet regularly, consider AFO in the future, balance and strength training, gait training, treatment for L hamstring strain     PT Home Exercise Plan  sit<>stand with UE assist, tandem stance, romberg with horizontal head turns, marching in standing    Consulted and Agree with Plan of Care  Patient       Patient will benefit from skilled therapeutic intervention in order to improve the following deficits and impairments:  Abnormal gait, Decreased activity tolerance, Decreased balance, Decreased endurance, Decreased knowledge of use of DME, Decreased mobility, Decreased range of motion, Decreased safety awareness, Decreased strength, Difficulty walking, Hypomobility, Increased fascial restricitons, Impaired perceived functional ability, Increased muscle spasms, Impaired flexibility, Impaired sensation,  Improper body  mechanics, Postural dysfunction, Pain  Visit Diagnosis: Unsteadiness on feet  Other abnormalities of gait and mobility  Muscle weakness (generalized)     Problem List Patient Active Problem List   Diagnosis Date Noted  . Lumbar radiculopathy 06/24/2017  . Waddling gait 03/19/2017  . Gait abnormality 02/26/2017  . Advanced care planning/counseling discussion 08/04/2016  . S/P total knee replacement using cement, left 04/01/2016  . Primary osteoarthritis of right knee 03/19/2016  . Hyperlipidemia 11/19/2014  . Diabetes mellitus without complication ([place]) 12/24/7586  . Nonspecific abnormal results of thyroid function study 07/18/2014  . Hypertension 07/18/2014  . Dysphagia 05/11/2013    Trotter,Margaret PT, DPT 08/03/2017, 2:57 PM  [place] MAIN [facility] SERVICES [address] [place], [place], [address] Phone: [phone]   Fax:  [phone]  Name: Deanna Bird MRN: [id number] Date of Birth: [birth year]

## 2017-08-05 ENCOUNTER — Ambulatory Visit: Payer: PPO | Attending: Neurology

## 2017-08-05 DIAGNOSIS — R2681 Unsteadiness on feet: Secondary | ICD-10-CM | POA: Diagnosis not present

## 2017-08-05 DIAGNOSIS — R2689 Other abnormalities of gait and mobility: Secondary | ICD-10-CM | POA: Diagnosis not present

## 2017-08-05 DIAGNOSIS — M6281 Muscle weakness (generalized): Secondary | ICD-10-CM

## 2017-08-05 NOTE — Therapy (Addendum)
[place] [facility] SERVICES [address] [place], [place], [address] Phone: [phone]   Fax:  [phone]  Physical Therapy Treatment  Patient Details  Name: Deanna Bird MRN: [id number] Date of Birth: 17-Nov-1928 Referring Provider: Vladimir Crofts, MD    Encounter Date: 08/05/2017  PT End of Session - 08/05/17 1650    Visit Number  8    Number of Visits  17    Date for PT Re-Evaluation  08/26/17    PT Start Time  1350    PT Stop Time  1430    PT Time Calculation (min)  40 min    Equipment Utilized During Treatment  Gait belt    Activity Tolerance  Patient tolerated treatment well;Patient limited by fatigue    Behavior During Therapy  [facility] for tasks assessed/performed       Past Medical History:  Diagnosis Date  . Achalasia   . Barrett's esophagus with dysplasia   . Diabetes mellitus without complication ([place])   . GERD (gastroesophageal reflux disease)   . Hyperlipidemia   . Hypertension   . MRSA infection    lung  . Thyroid disease     Past Surgical History:  Procedure Laterality Date  . ABDOMINAL HYSTERECTOMY    . achalasia    . APPENDECTOMY    . HIATAL HERNIA REPAIR    . KNEE ARTHROPLASTY Right 04/01/2016   Procedure: COMPUTER ASSISTED TOTAL KNEE ARTHROPLASTY;  Surgeon: Dereck Leep, MD;  Location: [facility] ORS;  Service: Orthopedics;  Laterality: Right;  . KNEE ARTHROPLASTY Left 11/30/2016   Procedure: COMPUTER ASSISTED TOTAL KNEE ARTHROPLASTY;  Surgeon: Dereck Leep, MD;  Location: [facility] ORS;  Service: Orthopedics;  Laterality: Left;  . PARATHYROIDECTOMY    . THYROID EXPLORATION    . TONSILLECTOMY      There were no vitals filed for this visit.  Subjective Assessment - 08/05/17 1649    Subjective  Pt reports continued hamstring pain. No specific questions upon arrival. Does not rate pain.     Pertinent History  Pt is a 84 y/o F who reports she has been diagnosed with spinal stenosis but that she is here for her  balance.  Pt reports 3 falls in the past month with one of them occurring yesterday and a total of 4 falls in the past 6 months.  Pt says she is weak in her LLE, causing her to pull on the rail when going up steps, placing a lot of weight through her RUE making her R arm hurt.  Pt denies weakness in LUE. Pt ambulates with her hurry cane at all times. Pt believes her imbalance began in 2017.  Goals: "to be able to walk without feeling like I am going to stumble", working in the yard without losing her balance. Pt reports pain in L buttocks with sit>stand and standing, no pain in sitting. Pain for several months on and off, currently on for 3-4 days. Says it started after sitting on pillow in car.  Pt says she is constipated and is taking dulcolax for this, pt denies night sweats and saddle anesthesia. Pt reports she is tired all of the time and feels "washed out" but denies suicidal thoughts, feeling down, or feeling depressed.  Told pt this therapist would like to notify pt's MD of pt's feeling of "washed out" but pt politely requests for pt to let MD know during her appointment on 7/12. Pt has lost 50 lbs over the course  of 2 years (Dr. Manuella Ghazi aware). Pt contributes this to pneumonia and being on antibiotics. EMG on 3/29 with Impression: "This is an abnormal electrodiagnostic study consistent with a 1) generalized polyneuropathy. 2) superimposed chronic L5 radiculopathy."     Limitations  Lifting;Standing;Walking;House hold activities    How long can you stand comfortably?  10 minutes without UE support    How long can you walk comfortably?  household distances due to fatigue    Diagnostic tests  MRI on 03/29/17: "L4-5 severe facet arthropathy with anterolisthesis. Spinal stenosis is advanced and there is left L4 foraminal impingement. Elsewhere mild for age degenerative changes as described above. Pt had EMG done on 3/29: "EMG & NCV Findings: Evaluation of the Left peroneal motor nerve showed no response (Ankle)  and no response (Ankle). The Left tibial motor nerve showed prolonged distal onset latency (7.0 ms) and reduced amplitude (1.0 mV). The Left ulnar motor nerve showed prolonged distal onset latency (5.0 ms). The Left radial sensory nerve showed prolonged distal peak latency (2.6 ms). The Left superficial peroneal sensory nerve showed no response (14 cm). The Left sural sensory nerve showed no response (Calf). The Left ulnar sensory nerve showed prolonged distal peak latency (3.8 ms) and decreased conduction velocity (Wrist-5th Digit, 32 m/s)".     Patient Stated Goals  see above    Currently in Pain?  Yes Does not rate at this time          TREATMENT:  Ther-ex  Warm up on Nustep BUE/BLE level 2 x4 min (unbilled); Standing HS stretch on step 30s hold bilateral; Hooklying L 90/90 HS stretch with positive reproduction of proximal hamstring pain 30s hold x 2; Hooklying adductor squeeze with manual resistance 2 x 10; Hooklying abduction with manual resistance 2 x 10; Hooklying SLR 2 x 10 bilateral;  Manual Therapy  Extensive STM to L proximal hamstrings especially near ischial tuberosity as well as posterior hip external rotators, no notable trigger points with inconsistent reports of tenderness. Applied Biofreeze following STM;  Pt reports improvement in her pain at the end of session but does not rate;                        PT Education - 08/05/17 1650    Education Details  benefits of soft tissue mobilization for hamstring strain    Person(s) Educated  Patient    Methods  Explanation    Comprehension  Verbalized understanding       PT Short Term Goals - 07/01/17 1611      PT SHORT TERM GOAL #1   Title  Pt will independent complete HEP at least 4 days/wk for carryover between sessions    Time  2    Period  Weeks    Status  New        PT Long Term Goals - 07/01/17 1612      PT LONG TERM GOAL #1   Title  [name] 5xSTS will improve to equal to or less than 16  seconds without UE assist to demonstrate improved BLE strength    Baseline  24.98 seconds with UE assist    Time  8    Period  Weeks    Status  New      PT LONG TERM GOAL #2   Title  [name] 33mWT time will improve to equal to or greater than 0.9 m/s to demonstrate improved safety ambulating in the community    Baseline  0.50  m/s     Time  6    Period  Weeks    Status  New      PT LONG TERM GOAL #3   Title  [name] Berg Balance Test score will improve to equal to or greater than 44/56 to demonstrate decreased likelihood of falling    Baseline  32/56    Time  8    Period  Weeks    Status  New      PT LONG TERM GOAL #4   Title  Pt will be able to walk at least 200 ft without a rest break using SPC to demonstrate improved ambulatory endurance    Baseline  Pt limited to ambulating household distances due to fatigue    Time  7    Period  Weeks    Status  New         Clinical Impression: Unable to find any notable trigger points during STM of hamstrings however pt does report decrease in pain at end of session. She does report positive reproduction of pain with distal hamstring stretch at 90 degrees of hip flexion. Pt encouraged to continue HEP and follow-up as scheduled.    Plan - 08/05/17 1651    Rehab Potential  Good    PT Frequency  2x / week    PT Duration  8 weeks    PT Treatment/Interventions  ADLs/Self Care Home Management;Aquatic Therapy;Biofeedback;Cryotherapy;Electrical Stimulation;Iontophoresis 4mg /ml Dexamethasone;Moist Heat;Traction;Ultrasound;DME Instruction;Contrast Bath;Gait training;Stair training;Functional mobility training;Therapeutic activities;Therapeutic exercise;Balance training;Neuromuscular re-education;Patient/family education;Orthotic Fit/Training;Manual techniques;Compression bandaging;Passive range of motion;Dry needling;Energy conservation;Taping    PT Next Visit Plan  Semmes-Wienstein testing and discuss checking feet regularly, consider AFO in the future,  balance and strength training, gait training, treatment for L hamstring strain     PT Home Exercise Plan  sit<>stand with UE assist, tandem stance, romberg with horizontal head turns, marching in standing    Consulted and Agree with Plan of Care  Patient       Patient will benefit from skilled therapeutic intervention in order to improve the following deficits and impairments:  Abnormal gait, Decreased activity tolerance, Decreased balance, Decreased endurance, Decreased knowledge of use of DME, Decreased mobility, Decreased range of motion, Decreased safety awareness, Decreased strength, Difficulty walking, Hypomobility, Increased fascial restricitons, Impaired perceived functional ability, Increased muscle spasms, Impaired flexibility, Impaired sensation, Improper body mechanics, Postural dysfunction, Pain  Visit Diagnosis: Other abnormalities of gait and mobility  Muscle weakness (generalized)     Problem List Patient Active Problem List   Diagnosis Date Noted  . Lumbar radiculopathy 06/24/2017  . Waddling gait 03/19/2017  . Gait abnormality 02/26/2017  . Advanced care planning/counseling discussion 08/04/2016  . S/P total knee replacement using cement, left 04/01/2016  . Primary osteoarthritis of right knee 03/19/2016  . Hyperlipidemia 11/19/2014  . Diabetes mellitus without complication ([place]) 40/98/1191  . Nonspecific abnormal results of thyroid function study 07/18/2014  . Hypertension 07/18/2014  . Dysphagia 05/11/2013   Phillips Grout PT, DPT, GCS  Reniyah Gootee 08/05/2017, 4:52 PM  [place] [facility] SERVICES [address] [place], [place], [address] Phone: [phone]   Fax:  [phone]  Name: Deanna Bird MRN: [id number] Date of Birth: July 09, 1928

## 2017-08-10 ENCOUNTER — Ambulatory Visit: Payer: PPO

## 2017-08-10 ENCOUNTER — Encounter: Payer: Self-pay | Admitting: Family Medicine

## 2017-08-10 ENCOUNTER — Ambulatory Visit (INDEPENDENT_AMBULATORY_CARE_PROVIDER_SITE_OTHER): Payer: PPO | Admitting: Family Medicine

## 2017-08-10 VITALS — BP 136/61 | HR 105 | Ht 63.0 in | Wt 124.0 lb

## 2017-08-10 DIAGNOSIS — Z7189 Other specified counseling: Secondary | ICD-10-CM

## 2017-08-10 DIAGNOSIS — E119 Type 2 diabetes mellitus without complications: Secondary | ICD-10-CM | POA: Diagnosis not present

## 2017-08-10 DIAGNOSIS — M6281 Muscle weakness (generalized): Secondary | ICD-10-CM

## 2017-08-10 DIAGNOSIS — R2689 Other abnormalities of gait and mobility: Secondary | ICD-10-CM | POA: Diagnosis not present

## 2017-08-10 DIAGNOSIS — E782 Mixed hyperlipidemia: Secondary | ICD-10-CM | POA: Diagnosis not present

## 2017-08-10 DIAGNOSIS — I10 Essential (primary) hypertension: Secondary | ICD-10-CM | POA: Diagnosis not present

## 2017-08-10 DIAGNOSIS — R7989 Other specified abnormal findings of blood chemistry: Secondary | ICD-10-CM

## 2017-08-10 DIAGNOSIS — E785 Hyperlipidemia, unspecified: Secondary | ICD-10-CM | POA: Diagnosis not present

## 2017-08-10 DIAGNOSIS — R2681 Unsteadiness on feet: Secondary | ICD-10-CM

## 2017-08-10 LAB — URINALYSIS, ROUTINE W REFLEX MICROSCOPIC
BILIRUBIN UA: NEGATIVE
Glucose, UA: NEGATIVE
Ketones, UA: NEGATIVE
Nitrite, UA: NEGATIVE
PH UA: 5.5 (ref 5.0–7.5)
Protein, UA: NEGATIVE
RBC UA: NEGATIVE
Specific Gravity, UA: 1.015 (ref 1.005–1.030)
Urobilinogen, Ur: 0.2 mg/dL (ref 0.2–1.0)

## 2017-08-10 LAB — MICROSCOPIC EXAMINATION: Bacteria, UA: NONE SEEN

## 2017-08-10 LAB — BAYER DCA HB A1C WAIVED: HB A1C (BAYER DCA - WAIVED): 5.5 % (ref <7.0)

## 2017-08-10 MED ORDER — GLIMEPIRIDE 1 MG PO TABS: 1.0000 mg | ORAL_TABLET | Freq: Every day | ORAL | 12 refills | Status: DC

## 2017-08-10 MED ORDER — LOSARTAN POTASSIUM-HCTZ 100-25 MG PO TABS: 1.0000 | ORAL_TABLET | Freq: Every day | ORAL | 12 refills | Status: DC

## 2017-08-10 MED ORDER — METOPROLOL SUCCINATE ER 100 MG PO TB24: 100.0000 mg | ORAL_TABLET | Freq: Every day | ORAL | 12 refills | Status: DC

## 2017-08-10 MED ORDER — FENOFIBRIC ACID 105 MG PO TABS: 1.0000 | ORAL_TABLET | Freq: Every day | ORAL | 12 refills | Status: DC

## 2017-08-10 NOTE — Progress Notes (Signed)
BP 136/61   Pulse (!) 105   Ht 5\' 3"  (1.6 m)   Wt 124 lb (56.2 kg)   SpO2 97%   BMI 21.97 kg/m    Subjective:    Patient ID: Deanna Bird, female    DOB: 1928-03-27, 84 y.o.   MRN: [id number]  HPI: Deanna Bird is a 84 y.o. female  Chief Complaint  Patient presents with  . Annual Exam   Patient's primary complaints are ongoing problems with her legs radiculopathy and waddling gait.  Reviewed notes from [place] and MRI showing spinal stenosis and degenerative arthritis of her spine. Patient is using her cane and has been going to physical therapy to help with gait training and some strengthening.  Patient admits this is not helping but may be is keeping her from advancing any faster. Also diabetes doing well occasionally takes Amaryl half a tablet. Pressure doing well with good control of blood pressure medicines Cholesterol doing okay with fenofibric acid.  Relevant past medical, surgical, family and social history reviewed and updated as indicated. Interim medical history since our last visit reviewed. Allergies and medications reviewed and updated.  Review of Systems  Constitutional: Negative.   HENT: Negative.   Eyes: Negative.   Respiratory: Negative.   Cardiovascular: Negative.   Gastrointestinal: Negative.   Endocrine: Negative.   Genitourinary: Negative.   Musculoskeletal: Positive for gait problem.  Skin: Negative.   Allergic/Immunologic: Negative.   Hematological: Negative.   Psychiatric/Behavioral: Negative.     Per HPI unless specifically indicated above     Objective:    BP 136/61   Pulse (!) 105   Ht 5\' 3"  (1.6 m)   Wt 124 lb (56.2 kg)   SpO2 97%   BMI 21.97 kg/m   Wt Readings from Last 3 Encounters:  08/10/17 124 lb (56.2 kg)  07/21/17 124 lb 9.6 oz (56.5 kg)  02/18/17 140 lb (63.5 kg)    Physical Exam  Constitutional: She is oriented to person, place, and time. She appears well-developed and well-nourished.  HENT:  Head: Normocephalic  and atraumatic.  Right Ear: External ear normal.  Left Ear: External ear normal.  Nose: Nose normal.  Mouth/Throat: Oropharynx is clear and moist.  Eyes: Pupils are equal, round, and reactive to light. Conjunctivae and EOM are normal.  Neck: Normal range of motion. Neck supple. Carotid bruit is not present.  Cardiovascular: Normal rate, regular rhythm and normal heart sounds.  No murmur heard. Pulmonary/Chest: Effort normal and breath sounds normal. She exhibits no mass. Right breast exhibits no mass, no skin change and no tenderness. Left breast exhibits no mass, no skin change and no tenderness. Breasts are symmetrical.  Abdominal: Soft. Bowel sounds are normal. There is no hepatosplenomegaly.  Musculoskeletal: Normal range of motion.  Neurological: She is alert and oriented to person, place, and time.  Skin: No rash noted.  Psychiatric: She has a normal mood and affect. Her behavior is normal. Judgment and thought content normal.    Results for orders placed or performed in visit on 02/04/17  Bayer DCA Hb A1c Waived  Result Value Ref Range   HB A1C (BAYER DCA - WAIVED) 4.9 <7.2 %  Basic metabolic panel  Result Value Ref Range   Glucose 75 65 - 99 mg/dL   BUN 30 (H) 8 - 27 mg/dL   Creatinine, Ser 1.31 (H) 0.57 - 1.00 mg/dL   GFR calc non Af Amer 37 (L) >59 mL/min/1.73   GFR calc Af Wyvonnia Lora  43 (L) >59 mL/min/1.73   BUN/Creatinine Ratio 23 12 - 28   Sodium 146 (H) 134 - 144 mmol/L   Potassium 4.4 3.5 - 5.2 mmol/L   Chloride 104 96 - 106 mmol/L   CO2 27 20 - 29 mmol/L   Calcium 10.1 8.7 - 10.3 mg/dL  LP+ALT+AST Piccolo, Waived  Result Value Ref Range   ALT (SGPT) Piccolo, Waived 27 10 - 47 U/L   AST (SGOT) Piccolo, Waived 34 11 - 38 U/L   Cholesterol Piccolo, Waived 116 <200 mg/dL   HDL Chol Piccolo, Waived 23 (L) >59 mg/dL   Triglycerides Piccolo,Waived 141 <150 mg/dL   Chol/HDL Ratio Piccolo,Waive 5.0 (H) mg/dL   LDL Chol Calc Piccolo Waived 64 <100 mg/dL   VLDL Chol Calc  Piccolo,Waive 28 <30 mg/dL      Assessment & Plan:   Problem List Items Addressed This Visit      Cardiovascular and Mediastinum   Hypertension - Primary   Relevant Medications   metoprolol succinate (TOPROL-XL) 100 MG 24 hr tablet   losartan-hydrochlorothiazide (HYZAAR) 100-25 MG tablet   Fenofibric Acid 105 MG TABS   Other Relevant Orders   CBC with Differential/Platelet   Comprehensive metabolic panel   Lipid panel   Urinalysis, Routine w reflex microscopic     Endocrine   Diabetes mellitus without complication (HCC)   Relevant Medications   losartan-hydrochlorothiazide (HYZAAR) 100-25 MG tablet   glimepiride (AMARYL) 1 MG tablet   Other Relevant Orders   CBC with Differential/Platelet   Comprehensive metabolic panel   Lipid panel   Urinalysis, Routine w reflex microscopic   Bayer DCA Hb A1c Waived     Other   Hyperlipidemia   Relevant Medications   metoprolol succinate (TOPROL-XL) 100 MG 24 hr tablet   losartan-hydrochlorothiazide (HYZAAR) 100-25 MG tablet   Fenofibric Acid 105 MG TABS   Other Relevant Orders   CBC with Differential/Platelet   Comprehensive metabolic panel   Lipid panel   Urinalysis, Routine w reflex microscopic   Advanced care planning/counseling discussion    A voluntary discussion about advanced care planning including explanation and discussion of advanced directives was extentively discussed with the patient.  Explained about the healthcare proxy and living will was reviewed and packet with forms with expiration of how to fill them out was given.  Time spent: Encounter 16+ min individuals present: Patient       Other Visit Diagnoses    Elevated TSH       Relevant Orders   TSH       Follow up plan: Return in about 6 months (around 02/10/2018) for BMP,  Lipids, ALT, AST, Hemoglobin A1c.

## 2017-08-10 NOTE — Assessment & Plan Note (Signed)
A voluntary discussion about advanced care planning including explanation and discussion of advanced directives was extentively discussed with the patient.  Explained about the healthcare proxy and living will was reviewed and packet with forms with expiration of how to fill them out was given.  Time spent: Encounter 16+ min individuals present: Patient 

## 2017-08-10 NOTE — Therapy (Signed)
[place] MAIN [facility] SERVICES [address] [place], [place], [address] Phone: [phone]   Fax:  [phone]  Physical Therapy Treatment  Patient Details  Name: Deanna Bird MRN: [id number] Date of Birth: [birth year] Referring Provider: Vladimir Crofts, MD    Encounter Date: 08/10/2017  PT End of Session - 08/10/17 1419    Visit Number  9    Number of Visits  17    Date for PT Re-Evaluation  08/26/17    Authorization Type  Progress note 9/10    Authorization Time Period  Last goals: 07/01/17    PT Start Time  1415    PT Stop Time  1500    PT Time Calculation (min)  45 min    Equipment Utilized During Treatment  Gait belt    Activity Tolerance  Patient tolerated treatment well    Behavior During Therapy  [facility] for tasks assessed/performed       Past Medical History:  Diagnosis Date  . Achalasia   . Barrett's esophagus with dysplasia   . Diabetes mellitus without complication ([place])   . GERD (gastroesophageal reflux disease)   . Hyperlipidemia   . Hypertension   . MRSA infection    lung  . Thyroid disease     Past Surgical History:  Procedure Laterality Date  . ABDOMINAL HYSTERECTOMY    . achalasia    . APPENDECTOMY    . HIATAL HERNIA REPAIR    . KNEE ARTHROPLASTY Right 04/01/2016   Procedure: COMPUTER ASSISTED TOTAL KNEE ARTHROPLASTY;  Surgeon: Dereck Leep, MD;  Location: [facility] ORS;  Service: Orthopedics;  Laterality: Right;  . KNEE ARTHROPLASTY Left 11/30/2016   Procedure: COMPUTER ASSISTED TOTAL KNEE ARTHROPLASTY;  Surgeon: Dereck Leep, MD;  Location: [facility] ORS;  Service: Orthopedics;  Laterality: Left;  . PARATHYROIDECTOMY    . THYROID EXPLORATION    . TONSILLECTOMY      There were no vitals filed for this visit.  Subjective Assessment - 08/10/17 1417    Subjective  Pt denies LLE pain upon arrival today. HEP is going well. No specific questions or concerns at this time.     Pertinent History  Pt is a 84 y/o F who  reports she has been diagnosed with spinal stenosis but that she is here for her balance.  Pt reports 3 falls in the past month with one of them occurring yesterday and a total of 4 falls in the past 6 months.  Pt says she is weak in her LLE, causing her to pull on the rail when going up steps, placing a lot of weight through her RUE making her R arm hurt.  Pt denies weakness in LUE. Pt ambulates with her hurry cane at all times. Pt believes her imbalance began in 2017.  Goals: "to be able to walk without feeling like I am going to stumble", working in the yard without losing her balance. Pt reports pain in L buttocks with sit>stand and standing, no pain in sitting. Pain for several months on and off, currently on for 3-4 days. Says it started after sitting on pillow in car.  Pt says she is constipated and is taking dulcolax for this, pt denies night sweats and saddle anesthesia. Pt reports she is tired all of the time and feels "washed out" but denies suicidal thoughts, feeling down, or feeling depressed.  Told pt this therapist would like to notify pt's MD of pt's feeling of "washed out" but pt  politely requests for pt to let MD know during her appointment on 7/12. Pt has lost 50 lbs over the course of 2 years (Dr. Manuella Ghazi aware). Pt contributes this to pneumonia and being on antibiotics. EMG on 3/29 with Impression: "This is an abnormal electrodiagnostic study consistent with a 1) generalized polyneuropathy. 2) superimposed chronic L5 radiculopathy."     Limitations  Lifting;Standing;Walking;House hold activities    How long can you stand comfortably?  10 minutes without UE support    How long can you walk comfortably?  household distances due to fatigue    Diagnostic tests  MRI on 03/29/17: "L4-5 severe facet arthropathy with anterolisthesis. Spinal stenosis is advanced and there is left L4 foraminal impingement. Elsewhere mild for age degenerative changes as described above. Pt had EMG done on 3/29: "EMG & NCV  Findings: Evaluation of the Left peroneal motor nerve showed no response (Ankle) and no response (Ankle). The Left tibial motor nerve showed prolonged distal onset latency (7.0 ms) and reduced amplitude (1.0 mV). The Left ulnar motor nerve showed prolonged distal onset latency (5.0 ms). The Left radial sensory nerve showed prolonged distal peak latency (2.6 ms). The Left superficial peroneal sensory nerve showed no response (14 cm). The Left sural sensory nerve showed no response (Calf). The Left ulnar sensory nerve showed prolonged distal peak latency (3.8 ms) and decreased conduction velocity (Wrist-5th Digit, 32 m/s)".     Patient Stated Goals  see above    Currently in Pain?  No/denies Pt denies pain currently but has had pain intermittently throughout the day with activity          TREATMENT:  Ther-ex  Warm up on Nustep BUE/BLE level 2 x 4 min during history (2 minutes unbilled); Hooklying bridges 2 x 10; Hooklying SLR 2 x 10 bilateral; Hooklying adductor squeeze with manual resistance 2 x 10; Hooklying abduction with manual resistance 2 x 10; Hooklying L SLR stretch with added dorsiflexion/plantarflexion for sciatic nerve mobility; Hooklying manually resisted trunk rotation 5s hold x 10 each direction, repeated twice; R sidelying L abduction straight leg raise attempted however pt unable due to weakness; Supine L hip abduction with light manual resistance x 10; Sit to stand from low mat table 2 x 10;  Manual Therapy  L hip long axis distraction oscillation with belt assist 30s x 2; R sidelying gapping mobilization with pillow under side, grade II-III at L4/5 30s/bout x 4 bouts; Seated sciatic nerve glides instructed to patient and performed x 10                        PT Education - 08/10/17 1419    Education Details  exercise form/technique    Person(s) Educated  Patient    Methods  Explanation    Comprehension  Verbalized understanding       PT  Short Term Goals - 07/01/17 1611      PT SHORT TERM GOAL #1   Title  Pt will independent complete HEP at least 4 days/wk for carryover between sessions    Time  2    Period  Weeks    Status  New        PT Long Term Goals - 07/01/17 1612      PT LONG TERM GOAL #1   Title  [name] 5xSTS will improve to equal to or less than 16 seconds without UE assist to demonstrate improved BLE strength    Baseline  24.98 seconds with UE  assist    Time  8    Period  Weeks    Status  New      PT LONG TERM GOAL #2   Title  [name] 57mWT time will improve to equal to or greater than 0.9 m/s to demonstrate improved safety ambulating in the community    Baseline  0.50 m/s     Time  6    Period  Weeks    Status  New      PT LONG TERM GOAL #3   Title  [name] Berg Balance Test score will improve to equal to or greater than 44/56 to demonstrate decreased likelihood of falling    Baseline  32/56    Time  8    Period  Weeks    Status  New      PT LONG TERM GOAL #4   Title  Pt will be able to walk at least 200 ft without a rest break using SPC to demonstrate improved ambulatory endurance    Baseline  Pt limited to ambulating household distances due to fatigue    Time  7    Period  Weeks    Status  New            Plan - 08/10/17 1419    Clinical Impression Statement  Patient denies posterior L thigh pain today upon arrival. Positive reproduction with L posterior thigh pain with both left and right SLR. Performed long axis L hip distraction as well as R sidelying L lumbar gapping mobilization with therapy. Pt instructed in seated sciatic nerve glides. Pt encouraged to continue HEP and follow-up as scheduled. She would benefit from additional skilled PT intervention to improve balance, gait safety and reduce pain with ADLs.    Rehab Potential  Good    PT Frequency  2x / week    PT Duration  8 weeks    PT Treatment/Interventions  ADLs/Self Care Home Management;Aquatic  Therapy;Biofeedback;Cryotherapy;Electrical Stimulation;Iontophoresis 4mg /ml Dexamethasone;Moist Heat;Traction;Ultrasound;DME Instruction;Contrast Bath;Gait training;Stair training;Functional mobility training;Therapeutic activities;Therapeutic exercise;Balance training;Neuromuscular re-education;Patient/family education;Orthotic Fit/Training;Manual techniques;Compression bandaging;Passive range of motion;Dry needling;Energy conservation;Taping    PT Next Visit Plan  Outcome measures, goals, and progress note    PT Home Exercise Plan  sit<>stand with UE assist, tandem stance, romberg with horizontal head turns, marching in standing    Consulted and Agree with Plan of Care  Patient       Patient will benefit from skilled therapeutic intervention in order to improve the following deficits and impairments:  Abnormal gait, Decreased activity tolerance, Decreased balance, Decreased endurance, Decreased knowledge of use of DME, Decreased mobility, Decreased range of motion, Decreased safety awareness, Decreased strength, Difficulty walking, Hypomobility, Increased fascial restricitons, Impaired perceived functional ability, Increased muscle spasms, Impaired flexibility, Impaired sensation, Improper body mechanics, Postural dysfunction, Pain  Visit Diagnosis: Other abnormalities of gait and mobility  Muscle weakness (generalized)  Unsteadiness on feet     Problem List Patient Active Problem List   Diagnosis Date Noted  . Lumbar radiculopathy 06/24/2017  . Waddling gait 03/19/2017  . Gait abnormality 02/26/2017  . Advanced care planning/counseling discussion 08/04/2016  . S/P total knee replacement using cement, left 04/01/2016  . Primary osteoarthritis of right knee 03/19/2016  . Hyperlipidemia 11/19/2014  . Diabetes mellitus without complication ([place]) 96/29/5284  . Nonspecific abnormal results of thyroid function study 07/18/2014  . Hypertension 07/18/2014  . Dysphagia 05/11/2013   Lyndel Safe  Kanyon Bunn PT, DPT, GCS  Adeena Bernabe 08/10/2017, 3:15 PM  [place]  [place], [place], [address] Phone: [phone]   Fax:  [phone]  Name: Deanna Bird MRN: [id number] Date of Birth: Apr 29, 1928

## 2017-08-11 LAB — CBC WITH DIFFERENTIAL/PLATELET
BASOS ABS: 0 10*3/uL (ref 0.0–0.2)
BASOS: 0 %
EOS (ABSOLUTE): 0.1 10*3/uL (ref 0.0–0.4)
Eos: 1 %
Hematocrit: 34.3 % (ref 34.0–46.6)
Hemoglobin: 11.1 g/dL (ref 11.1–15.9)
IMMATURE GRANS (ABS): 0 10*3/uL (ref 0.0–0.1)
IMMATURE GRANULOCYTES: 0 %
LYMPHS: 11 %
Lymphocytes Absolute: 1.2 10*3/uL (ref 0.7–3.1)
MCH: 29.4 pg (ref 26.6–33.0)
MCHC: 32.4 g/dL (ref 31.5–35.7)
MCV: 91 fL (ref 79–97)
MONOCYTES: 5 %
Monocytes Absolute: 0.5 10*3/uL (ref 0.1–0.9)
NEUTROS PCT: 83 %
Neutrophils Absolute: 9 10*3/uL — ABNORMAL HIGH (ref 1.4–7.0)
PLATELETS: 322 10*3/uL (ref 150–450)
RBC: 3.78 x10E6/uL (ref 3.77–5.28)
RDW: 15.1 % (ref 12.3–15.4)
WBC: 10.9 10*3/uL — ABNORMAL HIGH (ref 3.4–10.8)

## 2017-08-11 LAB — COMPREHENSIVE METABOLIC PANEL
ALT: 14 IU/L (ref 0–32)
AST: 27 IU/L (ref 0–40)
Albumin/Globulin Ratio: 1.3 (ref 1.2–2.2)
Albumin: 3.6 g/dL (ref 3.5–4.7)
Alkaline Phosphatase: 46 IU/L (ref 39–117)
BUN/Creatinine Ratio: 20 (ref 12–28)
BUN: 23 mg/dL (ref 8–27)
Bilirubin Total: 0.7 mg/dL (ref 0.0–1.2)
CALCIUM: 9.3 mg/dL (ref 8.7–10.3)
CO2: 24 mmol/L (ref 20–29)
CREATININE: 1.17 mg/dL — AB (ref 0.57–1.00)
Chloride: 101 mmol/L (ref 96–106)
GFR, EST AFRICAN AMERICAN: 49 mL/min/{1.73_m2} — AB (ref >59)
GFR, EST NON AFRICAN AMERICAN: 42 mL/min/{1.73_m2} — AB (ref >59)
GLUCOSE: 96 mg/dL (ref 65–99)
Globulin, Total: 2.8 g/dL (ref 1.5–4.5)
Potassium: 4.1 mmol/L (ref 3.5–5.2)
Sodium: 141 mmol/L (ref 134–144)
Total Protein: 6.4 g/dL (ref 6.0–8.5)

## 2017-08-11 LAB — LIPID PANEL
CHOL/HDL RATIO: 4.7 ratio — AB (ref 0.0–4.4)
Cholesterol, Total: 80 mg/dL — ABNORMAL LOW (ref 100–199)
HDL: 17 mg/dL — AB (ref >39)
LDL Calculated: 40 mg/dL (ref 0–99)
Triglycerides: 114 mg/dL (ref 0–149)
VLDL CHOLESTEROL CAL: 23 mg/dL (ref 5–40)

## 2017-08-11 LAB — TSH: TSH: 5.37 u[IU]/mL — AB (ref 0.450–4.500)

## 2017-08-12 ENCOUNTER — Telehealth: Payer: Self-pay | Admitting: Family Medicine

## 2017-08-12 ENCOUNTER — Ambulatory Visit: Payer: PPO | Admitting: Physical Therapy

## 2017-08-12 DIAGNOSIS — R7989 Other specified abnormal findings of blood chemistry: Secondary | ICD-10-CM

## 2017-08-12 DIAGNOSIS — D72829 Elevated white blood cell count, unspecified: Secondary | ICD-10-CM

## 2017-08-12 NOTE — Telephone Encounter (Signed)
Phone call Discussed with patient slight elevation of white blood cells and slight elevation of thyroid. Patient with no complaints or symptoms. We will recheck CBC and TSH in 2 months or so.

## 2017-08-12 NOTE — Telephone Encounter (Signed)
-----   Message from Amada Kingfisher, [place] sent at 08/12/2017 12:24 PM EDT ----- Patient was transferred to provider for telephone conversation.

## 2017-08-17 ENCOUNTER — Encounter: Payer: Self-pay | Admitting: Physical Therapy

## 2017-08-17 ENCOUNTER — Ambulatory Visit: Payer: PPO | Admitting: Physical Therapy

## 2017-08-17 DIAGNOSIS — R2689 Other abnormalities of gait and mobility: Secondary | ICD-10-CM

## 2017-08-17 DIAGNOSIS — R2681 Unsteadiness on feet: Secondary | ICD-10-CM

## 2017-08-17 DIAGNOSIS — M6281 Muscle weakness (generalized): Secondary | ICD-10-CM

## 2017-08-17 NOTE — Therapy (Signed)
[place] MAIN [facility] SERVICES [address] [place], [place], [address] Phone: [phone]   Fax:  [phone]  Physical Therapy Treatment Physical Therapy Progress Note   Dates of reporting period  07/01/17   to   08/17/17   Patient Details  Name: Deanna Bird MRN: [id number] Date of Birth: April 11, 1928 Referring Provider: Vladimir Crofts, MD    Encounter Date: 08/17/2017  PT End of Session - 08/17/17 1430    Visit Number  10    Number of Visits  17    Date for PT Re-Evaluation  08/26/17    Authorization Type  Progress note 10/10    Authorization Time Period  Last goals: 07/01/17    PT Start Time  1342    PT Stop Time  1426    PT Time Calculation (min)  44 min    Equipment Utilized During Treatment  Gait belt    Activity Tolerance  Patient tolerated treatment well    Behavior During Therapy  [facility] for tasks assessed/performed       Past Medical History:  Diagnosis Date  . Achalasia   . Barrett's esophagus with dysplasia   . Diabetes mellitus without complication ([place])   . GERD (gastroesophageal reflux disease)   . Hyperlipidemia   . Hypertension   . MRSA infection    lung  . Thyroid disease     Past Surgical History:  Procedure Laterality Date  . ABDOMINAL HYSTERECTOMY    . achalasia    . APPENDECTOMY    . HIATAL HERNIA REPAIR    . KNEE ARTHROPLASTY Right 04/01/2016   Procedure: COMPUTER ASSISTED TOTAL KNEE ARTHROPLASTY;  Surgeon: Dereck Leep, MD;  Location: [facility] ORS;  Service: Orthopedics;  Laterality: Right;  . KNEE ARTHROPLASTY Left 11/30/2016   Procedure: COMPUTER ASSISTED TOTAL KNEE ARTHROPLASTY;  Surgeon: Dereck Leep, MD;  Location: [facility] ORS;  Service: Orthopedics;  Laterality: Left;  . PARATHYROIDECTOMY    . THYROID EXPLORATION    . TONSILLECTOMY      There were no vitals filed for this visit.  Subjective Assessment - 08/17/17 1348    Subjective  Patient reports less pain down LLE into thigh but is still  having posterior hip pain; Reports no change in walking;     Pertinent History  Pt is a 84 y/o F who reports she has been diagnosed with spinal stenosis but that she is here for her balance.  Pt reports 3 falls in the past month with one of them occurring yesterday and a total of 4 falls in the past 6 months.  Pt says she is weak in her LLE, causing her to pull on the rail when going up steps, placing a lot of weight through her RUE making her R arm hurt.  Pt denies weakness in LUE. Pt ambulates with her hurry cane at all times. Pt believes her imbalance began in 2017.  Goals: "to be able to walk without feeling like I am going to stumble", working in the yard without losing her balance. Pt reports pain in L buttocks with sit>stand and standing, no pain in sitting. Pain for several months on and off, currently on for 3-4 days. Says it started after sitting on pillow in car.  Pt says she is constipated and is taking dulcolax for this, pt denies night sweats and saddle anesthesia. Pt reports she is tired all of the time and feels "washed out" but denies suicidal thoughts, feeling down, or feeling  depressed.  Told pt this therapist would like to notify pt's MD of pt's feeling of "washed out" but pt politely requests for pt to let MD know during her appointment on 7/12. Pt has lost 50 lbs over the course of 2 years (Dr. Manuella Ghazi aware). Pt contributes this to pneumonia and being on antibiotics. EMG on 3/29 with Impression: "This is an abnormal electrodiagnostic study consistent with a 1) generalized polyneuropathy. 2) superimposed chronic L5 radiculopathy."     Limitations  Lifting;Standing;Walking;House hold activities    How long can you stand comfortably?  10 minutes without UE support    How long can you walk comfortably?  household distances due to fatigue    Diagnostic tests  MRI on 03/29/17: "L4-5 severe facet arthropathy with anterolisthesis. Spinal stenosis is advanced and there is left L4 foraminal  impingement. Elsewhere mild for age degenerative changes as described above. Pt had EMG done on 3/29: "EMG & NCV Findings: Evaluation of the Left peroneal motor nerve showed no response (Ankle) and no response (Ankle). The Left tibial motor nerve showed prolonged distal onset latency (7.0 ms) and reduced amplitude (1.0 mV). The Left ulnar motor nerve showed prolonged distal onset latency (5.0 ms). The Left radial sensory nerve showed prolonged distal peak latency (2.6 ms). The Left superficial peroneal sensory nerve showed no response (14 cm). The Left sural sensory nerve showed no response (Calf). The Left ulnar sensory nerve showed prolonged distal peak latency (3.8 ms) and decreased conduction velocity (Wrist-5th Digit, 32 m/s)".     Patient Stated Goals  see above    Currently in Pain?  Yes    Pain Score  4     Pain Location  Hip    Pain Orientation  Left    Pain Descriptors / Indicators  Aching;Sore    Pain Type  Chronic pain    Pain Onset  More than a month ago    Pain Frequency  Intermittent    Aggravating Factors   unsure    Pain Relieving Factors  pain meds    Effect of Pain on Daily Activities  decreased activity tolerance;     Multiple Pain Sites  No           TREATMENT: Patient hooklying: Instructed patient in LLE hamstring neural stretch with hip/knee flexion 90 degrees with knee extension and ankle DF 5 sec hold 2x5 reps; LLE SLR hamstring stretch with ankle DF/PF AROM 2x10 reps for neural flossing;    Had patient walk around gym following initial stretches; She reports less LLE discomfort following stretches;   Instructed patient in seated stretches: -LLE hamstring neural stretch with LAQ x10 reps; -LLE long sitting ankle DF/PF for neural flossing x10 reps;  Seated piriformis stretch LLE only 20 sec hold x2 reps;  Hooklying: Single knee to chest 20 sec hold x2 reps bilaterally; Piriformis stretch 20 sec hold x2 reps bilaterally;  Sidelying: Hip abduction  clamshells red tband 2x10 bilaterally; Patient required min VCs for correct positioning for better hip strengthening;   Following sidelying, instructed patient in hooklying stretches: nstructed patient in LLE hamstring neural stretch with hip/knee flexion 90 degrees with knee extension and ankle DF 5 sec hold x5 reps; LLE SLR hamstring stretch with ankle DF/PF AROM x10 reps for neural flossing;    Patient reports no pain at end of session;         PT Education - 08/17/17 1430    Education Details  Stretches, HEP reinforced;     Person(s)  Educated  Patient    Methods  Explanation;Verbal cues    Comprehension  Verbalized understanding;Returned demonstration;Verbal cues required;Need further instruction       PT Short Term Goals - 08/17/17 1705      PT SHORT TERM GOAL #1   Title  Pt will independent complete HEP at least 4 days/wk for carryover between sessions    Time  2    Period  Weeks    Status  On-going        PT Long Term Goals - 08/17/17 1705      PT LONG TERM GOAL #1   Title  [name] 5xSTS will improve to equal to or less than 16 seconds without UE assist to demonstrate improved BLE strength    Baseline  24.98 seconds with UE assist    Time  8    Period  Weeks    Status  Partially Met    Target Date  08/26/17      PT LONG TERM GOAL #2   Title  [name] 28mT time will improve to equal to or greater than 0.9 m/s to demonstrate improved safety ambulating in the community    Baseline  0.50 m/s     Time  6    Period  Weeks    Status  Partially Met    Target Date  08/26/17      PT LONG TERM GOAL #3   Title  [name] Berg Balance Test score will improve to equal to or greater than 44/56 to demonstrate decreased likelihood of falling    Baseline  32/56    Time  8    Period  Weeks    Status  Partially Met    Target Date  08/26/17      PT LONG TERM GOAL #4   Title  Pt will be able to walk at least 200 ft without a rest break using SPC to demonstrate improved ambulatory  endurance    Baseline  Pt limited to ambulating household distances due to fatigue    Time  7    Period  Weeks    Status  Partially Met    Target Date  08/26/17            Plan - 08/17/17 1622    Clinical Impression Statement  Patient reports less pain in left leg which has centralized to posterior hip; PT instructed patient in advanced LE stretches to facilitate better tissue extensibility; Patient tolerated stretches well reporting no pain in LLE following stretches; patient instructed in LE exercise but then started having increased hip discomfort. Re-educated in stretches which helped resolve discomfort. Patient provided with written HEP for better adherence. She reports no pain at end of session;  Patient's condition has the potential to improve in response to therapy. Maximum improvement is yet to be obtained. The anticipated improvement is attainable and reasonable in a generally predictable time. Start date of reporting period 06/27/19end date of reporting period08/13/19. Patient reports less pain in LLE. She is still unsteady with  Gait due to LE weakness;     Rehab Potential  Good    PT Frequency  2x / week    PT Duration  8 weeks    PT Treatment/Interventions  ADLs/Self Care Home Management;Aquatic Therapy;Biofeedback;Cryotherapy;Electrical Stimulation;Iontophoresis 440mml Dexamethasone;Moist Heat;Traction;Ultrasound;DME Instruction;Contrast Bath;Gait training;Stair training;Functional mobility training;Therapeutic activities;Therapeutic exercise;Balance training;Neuromuscular re-education;Patient/family education;Orthotic Fit/Training;Manual techniques;Compression bandaging;Passive range of motion;Dry needling;Energy conservation;Taping    PT Next Visit Plan  Outcome measures, goals, and progress note  PT Home Exercise Plan  sit<>stand with UE assist, tandem stance, romberg with horizontal head turns, marching in standing    Consulted and Agree with Plan of Care  Patient        Patient will benefit from skilled therapeutic intervention in order to improve the following deficits and impairments:  Abnormal gait, Decreased activity tolerance, Decreased balance, Decreased endurance, Decreased knowledge of use of DME, Decreased mobility, Decreased range of motion, Decreased safety awareness, Decreased strength, Difficulty walking, Hypomobility, Increased fascial restricitons, Impaired perceived functional ability, Increased muscle spasms, Impaired flexibility, Impaired sensation, Improper body mechanics, Postural dysfunction, Pain  Visit Diagnosis: Other abnormalities of gait and mobility  Muscle weakness (generalized)  Unsteadiness on feet     Problem List Patient Active Problem List   Diagnosis Date Noted  . Lumbar radiculopathy 06/24/2017  . Waddling gait 03/19/2017  . Gait abnormality 02/26/2017  . Advanced care planning/counseling discussion 08/04/2016  . S/P total knee replacement using cement, left 04/01/2016  . Primary osteoarthritis of right knee 03/19/2016  . Hyperlipidemia 11/19/2014  . Diabetes mellitus without complication ([place]) 97/53/0051  . Nonspecific abnormal results of thyroid function study 07/18/2014  . Hypertension 07/18/2014  . Dysphagia 05/11/2013    Rielyn Krupinski PT, DPT 08/17/2017, 5:06 PM  [place] MAIN [facility] SERVICES [address] [place], [place], [address] Phone: [phone]   Fax:  [phone]  Name: Deanna Bird MRN: [id number] Date of Birth: 1928-10-16

## 2017-08-17 NOTE — Patient Instructions (Signed)
Access Code: [email or web address]  URL: [email or web address]  Date: 08/17/2017  Prepared by: Blanche East   Exercises  Seated Table Hamstring Stretch - 10 reps - 1 sets - 5x daily - 7x weekly  Seated Long Arc Quad - 10 reps - 1 sets - 5x daily - 7x weekly  Seated Piriformis Stretch - 3 reps - 1 sets - 20 hold - 5x daily - 7x weekly

## 2017-08-19 ENCOUNTER — Ambulatory Visit: Payer: PPO | Admitting: Physical Therapy

## 2017-08-19 ENCOUNTER — Encounter: Payer: Self-pay | Admitting: Physical Therapy

## 2017-08-19 DIAGNOSIS — R2689 Other abnormalities of gait and mobility: Secondary | ICD-10-CM

## 2017-08-19 DIAGNOSIS — M6281 Muscle weakness (generalized): Secondary | ICD-10-CM

## 2017-08-19 DIAGNOSIS — R2681 Unsteadiness on feet: Secondary | ICD-10-CM

## 2017-08-19 NOTE — Therapy (Signed)
[place] MAIN [facility] SERVICES [address] [place], [place], [address] Phone: [phone]   Fax:  [phone]  Physical Therapy Treatment  Patient Details  Name: Deanna Bird MRN: [id number] Date of Birth: Aug 22, 1928 Referring Provider: Vladimir Crofts, MD    Encounter Date: 08/19/2017  PT End of Session - 08/19/17 1350    Visit Number  11    Number of Visits  17    Date for PT Re-Evaluation  08/26/17    Authorization Type  Progress note 1/10    Authorization Time Period  Last goals: 08/19/17    PT Start Time  0145    PT Stop Time  0230    PT Time Calculation (min)  45 min    Equipment Utilized During Treatment  Gait belt    Activity Tolerance  Patient tolerated treatment well    Behavior During Therapy  [facility] for tasks assessed/performed       Past Medical History:  Diagnosis Date  . Achalasia   . Barrett's esophagus with dysplasia   . Diabetes mellitus without complication ([place])   . GERD (gastroesophageal reflux disease)   . Hyperlipidemia   . Hypertension   . MRSA infection    lung  . Thyroid disease     Past Surgical History:  Procedure Laterality Date  . ABDOMINAL HYSTERECTOMY    . achalasia    . APPENDECTOMY    . HIATAL HERNIA REPAIR    . KNEE ARTHROPLASTY Right 04/01/2016   Procedure: COMPUTER ASSISTED TOTAL KNEE ARTHROPLASTY;  Surgeon: Dereck Leep, MD;  Location: [facility] ORS;  Service: Orthopedics;  Laterality: Right;  . KNEE ARTHROPLASTY Left 11/30/2016   Procedure: COMPUTER ASSISTED TOTAL KNEE ARTHROPLASTY;  Surgeon: Dereck Leep, MD;  Location: [facility] ORS;  Service: Orthopedics;  Laterality: Left;  . PARATHYROIDECTOMY    . THYROID EXPLORATION    . TONSILLECTOMY      There were no vitals filed for this visit.  Subjective Assessment - 08/19/17 1350    Subjective  Patient reports no pain today.  Reports no change in walking;     Pertinent History  Pt is a 84 y/o F who reports she has been diagnosed with spinal  stenosis but that she is here for her balance.  Pt reports 3 falls in the past month with one of them occurring yesterday and a total of 4 falls in the past 6 months.  Pt says she is weak in her LLE, causing her to pull on the rail when going up steps, placing a lot of weight through her RUE making her R arm hurt.  Pt denies weakness in LUE. Pt ambulates with her hurry cane at all times. Pt believes her imbalance began in 2017.  Goals: "to be able to walk without feeling like I am going to stumble", working in the yard without losing her balance. Pt reports pain in L buttocks with sit>stand and standing, no pain in sitting. Pain for several months on and off, currently on for 3-4 days. Says it started after sitting on pillow in car.  Pt says she is constipated and is taking dulcolax for this, pt denies night sweats and saddle anesthesia. Pt reports she is tired all of the time and feels "washed out" but denies suicidal thoughts, feeling down, or feeling depressed.  Told pt this therapist would like to notify pt's MD of pt's feeling of "washed out" but pt politely requests for pt to let MD know  during her appointment on 82/12. Pt has lost 50 lbs over the course of 2 years (Dr. Manuella Ghazi aware). Pt contributes this to pneumonia and being on antibiotics. EMG on 3/29 with Impression: "This is an abnormal electrodiagnostic study consistent with a 1) generalized polyneuropathy. 2) superimposed chronic L5 radiculopathy."     Limitations  Lifting;Standing;Walking;House hold activities    How long can you stand comfortably?  10 minutes without UE support    How long can you walk comfortably?  household distances due to fatigue    Diagnostic tests  MRI on 03/29/17: "L4-5 severe facet arthropathy with anterolisthesis. Spinal stenosis is advanced and there is left L4 foraminal impingement. Elsewhere mild for age degenerative changes as described above. Pt had EMG done on 3/29: "EMG & NCV Findings: Evaluation of the Left peroneal  motor nerve showed no response (Ankle) and no response (Ankle). The Left tibial motor nerve showed prolonged distal onset latency (7.0 ms) and reduced amplitude (1.0 mV). The Left ulnar motor nerve showed prolonged distal onset latency (5.0 ms). The Left radial sensory nerve showed prolonged distal peak latency (2.6 ms). The Left superficial peroneal sensory nerve showed no response (14 cm). The Left sural sensory nerve showed no response (Calf). The Left ulnar sensory nerve showed prolonged distal peak latency (3.8 ms) and decreased conduction velocity (Wrist-5th Digit, 32 m/s)".     Patient Stated Goals  see above    Currently in Pain?  No/denies    Pain Score  0-No pain    Pain Onset  More than a month ago       Treatment:  Patient performs outcome measures and they improved including 10 MW, 5 x sti to stand, berg balance test.  Gait training with SPC 50 feet x 2, 200 feet x 1 with SBA with limp and decreased gait speed  Leg press 60 lbs x 20 x 2.                        PT Education - 08/19/17 1350    Education Details  HEP    Person(s) Educated  Patient    Methods  Explanation    Comprehension  Verbalized understanding       PT Short Term Goals - 08/19/17 1351      PT SHORT TERM GOAL #1   Title  Pt will independent complete HEP at least 4 days/wk for carryover between sessions    Time  2    Period  Weeks    Status  Achieved    Target Date  08/19/17        PT Long Term Goals - 08/19/17 1352      PT LONG TERM GOAL #1   Title  [name] 5xSTS will improve to equal to or less than 16 seconds without UE assist to demonstrate improved BLE strength    Baseline  24.98 seconds with UE assist: 08/19/17 20.71    Time  8    Period  Weeks    Status  Partially Met    Target Date  08/26/17      PT LONG TERM GOAL #2   Title  [name] 60mT time will improve to equal to or greater than 0.9 m/s to demonstrate improved safety ambulating in the community    Baseline  0.50  m/s;   08/19/17  .55 m/sec    Time  6    Period  Weeks    Status  Partially Met  Target Date  08/26/17      PT LONG TERM GOAL #3   Title  [name] Berg Balance Test score will improve to equal to or greater than 44/56 to demonstrate decreased likelihood of falling    Baseline  32/56 08/26/17 = 40 /56     Time  8    Period  Weeks    Status  Partially Met    Target Date  08/26/17      PT LONG TERM GOAL #4   Title  Pt will be able to walk at least 200 ft without a rest break using SPC to demonstrate improved ambulatory endurance    Baseline  Pt limited to ambulating household distances due to fatigue, able to ambulate 200 feet with spc poor quality gait    Time  7    Period  Weeks    Status  Partially Met    Target Date  08/26/17            Plan - 08/19/17 1415    Clinical Impression Statement  Patient is having no pain today for the first time in years. She was tested today on her outcome measures including Berg balance test, 5 x sit to stand, 10 MW and improved in all her outcom measures. She will conitnue to benefit from skillled PT to improve her mobility and decrease her falls risk.     Rehab Potential  Good    PT Frequency  2x / week    PT Duration  8 weeks    PT Treatment/Interventions  ADLs/Self Care Home Management;Aquatic Therapy;Biofeedback;Cryotherapy;Electrical Stimulation;Iontophoresis 64m/ml Dexamethasone;Moist Heat;Traction;Ultrasound;DME Instruction;Contrast Bath;Gait training;Stair training;Functional mobility training;Therapeutic activities;Therapeutic exercise;Balance training;Neuromuscular re-education;Patient/family education;Orthotic Fit/Training;Manual techniques;Compression bandaging;Passive range of motion;Dry needling;Energy conservation;Taping    PT Next Visit Plan  Outcome measures, goals, and progress note    PT Home Exercise Plan  sit<>stand with UE assist, tandem stance, romberg with horizontal head turns, marching in standing    Consulted and Agree with  Plan of Care  Patient       Patient will benefit from skilled therapeutic intervention in order to improve the following deficits and impairments:  Abnormal gait, Decreased activity tolerance, Decreased balance, Decreased endurance, Decreased knowledge of use of DME, Decreased mobility, Decreased range of motion, Decreased safety awareness, Decreased strength, Difficulty walking, Hypomobility, Increased fascial restricitons, Impaired perceived functional ability, Increased muscle spasms, Impaired flexibility, Impaired sensation, Improper body mechanics, Postural dysfunction, Pain  Visit Diagnosis: Other abnormalities of gait and mobility  Muscle weakness (generalized)  Unsteadiness on feet     Problem List Patient Active Problem List   Diagnosis Date Noted  . Lumbar radiculopathy 06/24/2017  . Waddling gait 03/19/2017  . Gait abnormality 02/26/2017  . Advanced care planning/counseling discussion 08/04/2016  . S/P total knee replacement using cement, left 04/01/2016  . Primary osteoarthritis of right knee 03/19/2016  . Hyperlipidemia 11/19/2014  . Diabetes mellitus without complication ([place] 016/10/9602 . Nonspecific abnormal results of thyroid function study 07/18/2014  . Hypertension 07/18/2014  . Dysphagia 05/11/2013    MAlanson Puls PT DPT 08/19/2017, 2:28 PM  [place] [address] [place] [address]Phone: [phone]  Fax:  [phone] Name: Deanna BARKALOWMRN: [id number] of Birth: 9May 01, 1930

## 2017-08-24 ENCOUNTER — Ambulatory Visit: Payer: PPO | Admitting: Physical Therapy

## 2017-08-24 ENCOUNTER — Encounter: Payer: Self-pay | Admitting: Physical Therapy

## 2017-08-24 DIAGNOSIS — R2689 Other abnormalities of gait and mobility: Secondary | ICD-10-CM | POA: Diagnosis not present

## 2017-08-24 DIAGNOSIS — M6281 Muscle weakness (generalized): Secondary | ICD-10-CM

## 2017-08-24 DIAGNOSIS — R2681 Unsteadiness on feet: Secondary | ICD-10-CM

## 2017-08-24 NOTE — Patient Instructions (Signed)
Access Code: [email or web address]  URL: [email or web address]  Date: 08/24/2017  Prepared by: Blanche East   Exercises  Clamshell - 15 reps - 3 sets - 1x daily - 7x weekly  Seated Hip Abduction with Resistance - 15 reps - 3 sets - 1x daily - 7x weekly  Supine Quad Set - 15 reps - 3 sets - 5 hold - 1x daily - 7x weekly

## 2017-08-24 NOTE — Therapy (Signed)
[place] MAIN [facility] SERVICES [address] [place], [place], [address] Phone: [phone]   Fax:  [phone]  Physical Therapy Treatment  Patient Details  Name: Deanna Bird MRN: [id number] Date of Birth: 06-29-1928 Referring Provider: Vladimir Crofts, MD    Encounter Date: 08/24/2017  PT End of Session - 08/24/17 1351    Visit Number  12    Number of Visits  25    Date for PT Re-Evaluation  09/21/17    Authorization Type  Progress note 2/10    Authorization Time Period  Last goals: 08/19/17    PT Start Time  1345    PT Stop Time  1430    PT Time Calculation (min)  45 min    Equipment Utilized During Treatment  Gait belt    Activity Tolerance  Patient tolerated treatment well    Behavior During Therapy  [facility] for tasks assessed/performed       Past Medical History:  Diagnosis Date  . Achalasia   . Barrett's esophagus with dysplasia   . Diabetes mellitus without complication ([place])   . GERD (gastroesophageal reflux disease)   . Hyperlipidemia   . Hypertension   . MRSA infection    lung  . Thyroid disease     Past Surgical History:  Procedure Laterality Date  . ABDOMINAL HYSTERECTOMY    . achalasia    . APPENDECTOMY    . HIATAL HERNIA REPAIR    . KNEE ARTHROPLASTY Right 04/01/2016   Procedure: COMPUTER ASSISTED TOTAL KNEE ARTHROPLASTY;  Surgeon: Dereck Leep, MD;  Location: [facility] ORS;  Service: Orthopedics;  Laterality: Right;  . KNEE ARTHROPLASTY Left 11/30/2016   Procedure: COMPUTER ASSISTED TOTAL KNEE ARTHROPLASTY;  Surgeon: Dereck Leep, MD;  Location: [facility] ORS;  Service: Orthopedics;  Laterality: Left;  . PARATHYROIDECTOMY    . THYROID EXPLORATION    . TONSILLECTOMY      There were no vitals filed for this visit.  Subjective Assessment - 08/24/17 1350    Subjective  Patient reports no pain currently; She reports still having trouble with walking. She reports that her walking has been impaired for last few years.      Pertinent History  Pt is a 84 y/o F who reports she has been diagnosed with spinal stenosis but that she is here for her balance.  Pt reports 3 falls in the past month with one of them occurring yesterday and a total of 4 falls in the past 6 months.  Pt says she is weak in her LLE, causing her to pull on the rail when going up steps, placing a lot of weight through her RUE making her R arm hurt.  Pt denies weakness in LUE. Pt ambulates with her hurry cane at all times. Pt believes her imbalance began in 2017.  Goals: "to be able to walk without feeling like I am going to stumble", working in the yard without losing her balance. Pt reports pain in L buttocks with sit>stand and standing, no pain in sitting. Pain for several months on and off, currently on for 3-4 days. Says it started after sitting on pillow in car.  Pt says she is constipated and is taking dulcolax for this, pt denies night sweats and saddle anesthesia. Pt reports she is tired all of the time and feels "washed out" but denies suicidal thoughts, feeling down, or feeling depressed.  Told pt this therapist would like to notify pt's MD of pt's feeling  of "washed out" but pt politely requests for pt to let MD know during her appointment on 7/12. Pt has lost 50 lbs over the course of 2 years (Dr. Manuella Ghazi aware). Pt contributes this to pneumonia and being on antibiotics. EMG on 3/29 with Impression: "This is an abnormal electrodiagnostic study consistent with a 1) generalized polyneuropathy. 2) superimposed chronic L5 radiculopathy."     Limitations  Lifting;Standing;Walking;House hold activities    How long can you stand comfortably?  10 minutes without UE support    How long can you walk comfortably?  household distances due to fatigue    Diagnostic tests  MRI on 03/29/17: "L4-5 severe facet arthropathy with anterolisthesis. Spinal stenosis is advanced and there is left L4 foraminal impingement. Elsewhere mild for age degenerative changes as described  above. Pt had EMG done on 3/29: "EMG & NCV Findings: Evaluation of the Left peroneal motor nerve showed no response (Ankle) and no response (Ankle). The Left tibial motor nerve showed prolonged distal onset latency (7.0 ms) and reduced amplitude (1.0 mV). The Left ulnar motor nerve showed prolonged distal onset latency (5.0 ms). The Left radial sensory nerve showed prolonged distal peak latency (2.6 ms). The Left superficial peroneal sensory nerve showed no response (14 cm). The Left sural sensory nerve showed no response (Calf). The Left ulnar sensory nerve showed prolonged distal peak latency (3.8 ms) and decreased conduction velocity (Wrist-5th Digit, 32 m/s)".     Patient Stated Goals  see above    Currently in Pain?  No/denies    Pain Onset  More than a month ago    Multiple Pain Sites  No         TREATMENT: Seated LAQ LLE only 2 sec hold x12 reps with cues to increase hold time for better knee control;  Standing LLE terminal knee extension red tband x15 with min VCs for positioning;  Standing LLE terminal knee extension with small ball against wall 2x15  Sidelying: LLE hip abduction clamshells 2x12;  Supine: LLE SAQ with small ball under leg, 2x15 with min A to help keep ball stable as LE was unstable due to weakness; LLE SLR flexion x15 reps with min Vcs for positioning;  Bridge with LLE (single leg) x10 reps, required increased time due to weakness in hip;    Instructed patient in LLE hamstring neural stretch with hip/knee flexion 90 degrees with knee extension and ankle DF 5 sec hold x5 reps; LLE SLR hamstring stretch with ankle DF/PF AROM 2x10 reps for neural flossing;  LLE piriformis stretch 20 sec hold x1 rep;  Seated BLE hip abduction/ER 2x15 with red tband;   Advanced HEP- see patient instructions;  When walking patient encouraged to increase hip/knee extension on LLE due to stance for better stance control prior to right swing phase for less trendelenburg/unstable gait;                    PT Education - 08/24/17 1351    Education Details  LE strength, stretches    Person(s) Educated  Patient    Methods  Explanation;Verbal cues    Comprehension  Verbalized understanding;Returned demonstration;Verbal cues required;Need further instruction       PT Short Term Goals - 08/19/17 1351      PT SHORT TERM GOAL #1   Title  Pt will independent complete HEP at least 4 days/wk for carryover between sessions    Time  2    Period  Weeks    Status  Achieved    Target Date  08/19/17        PT Long Term Goals - 08/24/17 1401      PT LONG TERM GOAL #1   Title  [name] 5xSTS will improve to equal to or less than 16 seconds without UE assist to demonstrate improved BLE strength    Baseline  24.98 seconds with UE assist: 08/19/17 20.71    Time  4    Period  Weeks    Status  Partially Met    Target Date  09/21/17      PT LONG TERM GOAL #2   Title  [name] 79mT time will improve to equal to or greater than 0.9 m/s to demonstrate improved safety ambulating in the community    Baseline  0.50 m/s;   08/19/17  .55 m/sec    Time  4    Period  Weeks    Status  Partially Met    Target Date  09/21/17      PT LONG TERM GOAL #3   Title  [name] Berg Balance Test score will improve to equal to or greater than 44/56 to demonstrate decreased likelihood of falling    Baseline  32/56 08/26/17 = 40 /56     Time  4    Period  Weeks    Status  Partially Met    Target Date  09/21/17      PT LONG TERM GOAL #4   Title  Pt will be able to walk at least 200 ft without a rest break using SPC to demonstrate improved ambulatory endurance    Baseline  Pt limited to ambulating household distances due to fatigue, able to ambulate 200 feet with spc poor quality gait    Time  4    Period  Weeks    Status  Partially Met    Target Date  09/21/17            Plan - 08/24/17 1356    Clinical Impression Statement  Patient instructed in advanced LLE knee strengthening  exercise to improve knee control in stance. She required min VCs for correct exercise position and muscle activation for better knee control. She reports no increase in pain with exercise. When ambulating, patient exhibits decreased knee control on LLE with increased knee flexion in stance; She would benefit from additional skilled PT intervention to improve knee strength and mobility for better gait safety;     Rehab Potential  Good    PT Frequency  2x / week    PT Duration  4 weeks    PT Treatment/Interventions  ADLs/Self Care Home Management;Aquatic Therapy;Biofeedback;Cryotherapy;Electrical Stimulation;Iontophoresis 473mml Dexamethasone;Moist Heat;Traction;Ultrasound;DME Instruction;Contrast Bath;Gait training;Stair training;Functional mobility training;Therapeutic activities;Therapeutic exercise;Balance training;Neuromuscular re-education;Patient/family education;Orthotic Fit/Training;Manual techniques;Compression bandaging;Passive range of motion;Dry needling;Energy conservation;Taping    PT Next Visit Plan  Outcome measures, goals, and progress note    PT Home Exercise Plan  sit<>stand with UE assist, tandem stance, romberg with horizontal head turns, marching in standing    Consulted and Agree with Plan of Care  Patient       Patient will benefit from skilled therapeutic intervention in order to improve the following deficits and impairments:  Abnormal gait, Decreased activity tolerance, Decreased balance, Decreased endurance, Decreased knowledge of use of DME, Decreased mobility, Decreased range of motion, Decreased safety awareness, Decreased strength, Difficulty walking, Hypomobility, Increased fascial restricitons, Impaired perceived functional ability, Increased muscle spasms, Impaired flexibility, Impaired sensation, Improper body mechanics, Postural dysfunction, Pain  Visit Diagnosis: Other  abnormalities of gait and mobility  Muscle weakness (generalized)  Unsteadiness on  feet     Problem List Patient Active Problem List   Diagnosis Date Noted  . Lumbar radiculopathy 06/24/2017  . Waddling gait 03/19/2017  . Gait abnormality 02/26/2017  . Advanced care planning/counseling discussion 08/04/2016  . S/P total knee replacement using cement, left 04/01/2016  . Primary osteoarthritis of right knee 03/19/2016  . Hyperlipidemia 11/19/2014  . Diabetes mellitus without complication ([place]) 03/00/9233  . Nonspecific abnormal results of thyroid function study 07/18/2014  . Hypertension 07/18/2014  . Dysphagia 05/11/2013    Jayline Kilburg PT, DPT 08/24/2017, 2:29 PM  [place] MAIN [facility] SERVICES [address] [place], [place], [address] Phone: [phone]   Fax:  [phone]  Name: Deanna Bird MRN: [id number] Date of Birth: 1928-08-13

## 2017-08-26 ENCOUNTER — Ambulatory Visit: Payer: PPO | Admitting: Physical Therapy

## 2017-08-31 ENCOUNTER — Ambulatory Visit: Payer: PPO | Admitting: Physical Therapy

## 2017-08-31 ENCOUNTER — Encounter: Payer: Self-pay | Admitting: Physical Therapy

## 2017-08-31 DIAGNOSIS — R2689 Other abnormalities of gait and mobility: Secondary | ICD-10-CM

## 2017-08-31 DIAGNOSIS — M6281 Muscle weakness (generalized): Secondary | ICD-10-CM

## 2017-08-31 DIAGNOSIS — R2681 Unsteadiness on feet: Secondary | ICD-10-CM

## 2017-08-31 NOTE — Therapy (Signed)
[place] MAIN [facility] SERVICES [address] [place], [place], [address] Phone: [phone]   Fax:  [phone]  Physical Therapy Treatment  Patient Details  Name: Deanna Bird MRN: [id number] Date of Birth: 1928-08-05 Referring Provider: Vladimir Crofts, MD    Encounter Date: 08/31/2017  PT End of Session - 08/31/17 1309    Visit Number  13    Number of Visits  25    Date for PT Re-Evaluation  09/21/17    Authorization Type  Progress note 3/10    Authorization Time Period  Last goals: 08/19/17    PT Start Time  1302    PT Stop Time  1345    PT Time Calculation (min)  43 min    Equipment Utilized During Treatment  Gait belt    Activity Tolerance  Patient tolerated treatment well    Behavior During Therapy  [facility] for tasks assessed/performed       Past Medical History:  Diagnosis Date  . Achalasia   . Barrett's esophagus with dysplasia   . Diabetes mellitus without complication ([place])   . GERD (gastroesophageal reflux disease)   . Hyperlipidemia   . Hypertension   . MRSA infection    lung  . Thyroid disease     Past Surgical History:  Procedure Laterality Date  . ABDOMINAL HYSTERECTOMY    . achalasia    . APPENDECTOMY    . HIATAL HERNIA REPAIR    . KNEE ARTHROPLASTY Right 04/01/2016   Procedure: COMPUTER ASSISTED TOTAL KNEE ARTHROPLASTY;  Surgeon: Dereck Leep, MD;  Location: [facility] ORS;  Service: Orthopedics;  Laterality: Right;  . KNEE ARTHROPLASTY Left 11/30/2016   Procedure: COMPUTER ASSISTED TOTAL KNEE ARTHROPLASTY;  Surgeon: Dereck Leep, MD;  Location: [facility] ORS;  Service: Orthopedics;  Laterality: Left;  . PARATHYROIDECTOMY    . THYROID EXPLORATION    . TONSILLECTOMY      There were no vitals filed for this visit.  Subjective Assessment - 08/31/17 1308    Subjective  Patient reports no pain; Reports doing HEP faithfully for a few days and then things came up and she wasn't able to do them over the weekend;     Pertinent History  Pt is a 84 y/o F who reports she has been diagnosed with spinal stenosis but that she is here for her balance.  Pt reports 3 falls in the past month with one of them occurring yesterday and a total of 4 falls in the past 6 months.  Pt says she is weak in her LLE, causing her to pull on the rail when going up steps, placing a lot of weight through her RUE making her R arm hurt.  Pt denies weakness in LUE. Pt ambulates with her hurry cane at all times. Pt believes her imbalance began in 2017.  Goals: "to be able to walk without feeling like I am going to stumble", working in the yard without losing her balance. Pt reports pain in L buttocks with sit>stand and standing, no pain in sitting. Pain for several months on and off, currently on for 3-4 days. Says it started after sitting on pillow in car.  Pt says she is constipated and is taking dulcolax for this, pt denies night sweats and saddle anesthesia. Pt reports she is tired all of the time and feels "washed out" but denies suicidal thoughts, feeling down, or feeling depressed.  Told pt this therapist would like to notify pt's MD of  pt's feeling of "washed out" but pt politely requests for pt to let MD know during her appointment on 7/12. Pt has lost 50 lbs over the course of 2 years (Dr. Manuella Ghazi aware). Pt contributes this to pneumonia and being on antibiotics. EMG on 3/29 with Impression: "This is an abnormal electrodiagnostic study consistent with a 1) generalized polyneuropathy. 2) superimposed chronic L5 radiculopathy."     Limitations  Lifting;Standing;Walking;House hold activities    How long can you stand comfortably?  10 minutes without UE support    How long can you walk comfortably?  household distances due to fatigue    Diagnostic tests  MRI on 03/29/17: "L4-5 severe facet arthropathy with anterolisthesis. Spinal stenosis is advanced and there is left L4 foraminal impingement. Elsewhere mild for age degenerative changes as described  above. Pt had EMG done on 3/29: "EMG & NCV Findings: Evaluation of the Left peroneal motor nerve showed no response (Ankle) and no response (Ankle). The Left tibial motor nerve showed prolonged distal onset latency (7.0 ms) and reduced amplitude (1.0 mV). The Left ulnar motor nerve showed prolonged distal onset latency (5.0 ms). The Left radial sensory nerve showed prolonged distal peak latency (2.6 ms). The Left superficial peroneal sensory nerve showed no response (14 cm). The Left sural sensory nerve showed no response (Calf). The Left ulnar sensory nerve showed prolonged distal peak latency (3.8 ms) and decreased conduction velocity (Wrist-5th Digit, 32 m/s)".     Patient Stated Goals  see above    Currently in Pain?  No/denies    Pain Onset  More than a month ago    Multiple Pain Sites  No           TREATMENT: Standing with red tband around BLE: -hip abduction x10 bilaterally with cues to improve erect posture for better stance control  -hip extension x10 bilaterally; patient had difficulty keeping knee extended for better hip strengthening;  -Side stepping x10 feet, x2 laps each direction with rail assist for safety;  Supine: LLE SAQ with small bolster leg, x15  LLE SLR flexion x15 reps with min Vcs for positioning; Required min VCs to move leg slowly for better motor control;  Bridge with LLE (single leg) x15 reps, required increased time due to weakness in hip;    Instructed patient in LLE hamstring neural stretch with hip/knee flexion 90 degrees with knee extension and ankle DF 5 sec holdx5 reps; LLE SLR hamstring stretch with ankle DF/PF AROMx10 reps for neural flossing;  Sidelying: LLE hip abduction clamshells 2x15;  Leg press: BLE plate 75# [id number], required min VCs for positioning and to slow down LE movement for better strengthening; LLE only plate 45# [id number]   Balance: Instructed patient in stance control exercise: Standing on airex: -toe taps to 6 inch step x10  reps each LE with 1 rail assist; patient required min A with right toe taps due to poor hip control with left stance; -standing one foot on step, one on airex- BUE arm lifts x5 reps with CGA with right foot on step, able to exhibit better LLE stance control with right foot on step;    When walking 125 feet with SPC on level ground, CGA to close supervision patient encouraged to increase hip/knee extension on LLE to improve better stance control prior to right swing phase for less trendelenburg/unstable gait;                       PT Education - 08/31/17 1308  Education Details  LE strength, stretches, HEP reinforced;     Person(s) Educated  Patient    Methods  Explanation;Demonstration;Verbal cues    Comprehension  Verbalized understanding;Returned demonstration;Verbal cues required;Need further instruction       PT Short Term Goals - 08/19/17 1351      PT SHORT TERM GOAL #1   Title  Pt will independent complete HEP at least 4 days/wk for carryover between sessions    Time  2    Period  Weeks    Status  Achieved    Target Date  08/19/17        PT Long Term Goals - 08/24/17 1401      PT LONG TERM GOAL #1   Title  [name] 5xSTS will improve to equal to or less than 16 seconds without UE assist to demonstrate improved BLE strength    Baseline  24.98 seconds with UE assist: 08/19/17 20.71    Time  4    Period  Weeks    Status  Partially Met    Target Date  09/21/17      PT LONG TERM GOAL #2   Title  [name] 52mT time will improve to equal to or greater than 0.9 m/s to demonstrate improved safety ambulating in the community    Baseline  0.50 m/s;   08/19/17  .55 m/sec    Time  4    Period  Weeks    Status  Partially Met    Target Date  09/21/17      PT LONG TERM GOAL #3   Title  [name] Berg Balance Test score will improve to equal to or greater than 44/56 to demonstrate decreased likelihood of falling    Baseline  32/56 08/26/17 = 40 /56     Time  4    Period   Weeks    Status  Partially Met    Target Date  09/21/17      PT LONG TERM GOAL #4   Title  Pt will be able to walk at least 200 ft without a rest break using SPC to demonstrate improved ambulatory endurance    Baseline  Pt limited to ambulating household distances due to fatigue, able to ambulate 200 feet with spc poor quality gait    Time  4    Period  Weeks    Status  Partially Met    Target Date  09/21/17            Plan - 08/31/17 1311    Clinical Impression Statement  Patient instructed in advanced LLE strengthening. She requires min VCs for correct exercise technique including to slow down LE movement for better motor control; Progressed strengthening with increased repetition. Patient able to exhibit slight improvement in gait with less antalgic pattern with improved stance control on left side; Patient would benefit from additional skilled PT intervention to improve strength, balance and gait safety;     Rehab Potential  Good    PT Frequency  2x / week    PT Duration  4 weeks    PT Treatment/Interventions  ADLs/Self Care Home Management;Aquatic Therapy;Biofeedback;Cryotherapy;Electrical Stimulation;Iontophoresis 471mml Dexamethasone;Moist Heat;Traction;Ultrasound;DME Instruction;Contrast Bath;Gait training;Stair training;Functional mobility training;Therapeutic activities;Therapeutic exercise;Balance training;Neuromuscular re-education;Patient/family education;Orthotic Fit/Training;Manual techniques;Compression bandaging;Passive range of motion;Dry needling;Energy conservation;Taping    PT Next Visit Plan  Outcome measures, goals, and progress note    PT Home Exercise Plan  sit<>stand with UE assist, tandem stance, romberg with horizontal head turns, marching in standing  Consulted and Agree with Plan of Care  Patient       Patient will benefit from skilled therapeutic intervention in order to improve the following deficits and impairments:  Abnormal gait, Decreased balance,  Decreased endurance, Decreased knowledge of use of DME, Decreased mobility, Decreased range of motion, Decreased safety awareness, Decreased strength, Difficulty walking, Hypomobility, Increased fascial restricitons, Impaired perceived functional ability, Increased muscle spasms, Impaired flexibility, Impaired sensation, Improper body mechanics, Postural dysfunction, Pain, Decreased activity tolerance  Visit Diagnosis: Other abnormalities of gait and mobility  Muscle weakness (generalized)  Unsteadiness on feet     Problem List Patient Active Problem List   Diagnosis Date Noted  . Lumbar radiculopathy 06/24/2017  . Waddling gait 03/19/2017  . Gait abnormality 02/26/2017  . Advanced care planning/counseling discussion 08/04/2016  . S/P total knee replacement using cement, left 04/01/2016  . Primary osteoarthritis of right knee 03/19/2016  . Hyperlipidemia 11/19/2014  . Diabetes mellitus without complication ([place]) 20/94/7096  . Nonspecific abnormal results of thyroid function study 07/18/2014  . Hypertension 07/18/2014  . Dysphagia 05/11/2013    Hang Ammon PT, DPT 08/31/2017, 1:49 PM  [place] [facility] MAIN [facility] SERVICES [address] [place], [place], [address] Phone: [phone]   Fax:  [phone]  Name: Deanna Bird MRN: [id number] Date of Birth: 1928/01/10

## 2017-09-07 ENCOUNTER — Ambulatory Visit: Payer: PPO | Attending: Neurology | Admitting: Physical Therapy

## 2017-09-07 DIAGNOSIS — M6281 Muscle weakness (generalized): Secondary | ICD-10-CM | POA: Insufficient documentation

## 2017-09-07 DIAGNOSIS — R2689 Other abnormalities of gait and mobility: Secondary | ICD-10-CM | POA: Diagnosis not present

## 2017-09-07 DIAGNOSIS — R2681 Unsteadiness on feet: Secondary | ICD-10-CM

## 2017-09-07 NOTE — Therapy (Signed)
[place] MAIN [facility] SERVICES [address] [place], [place], [address] Phone: [phone]   Fax:  [phone]  Physical Therapy Treatment  Patient Details  Name: Deanna Bird MRN: [id number] Date of Birth: 1928/05/24 Referring Provider: Vladimir Crofts, MD    Encounter Date: 09/07/2017  PT End of Session - 09/07/17 1444    Visit Number  14    Number of Visits  25    Date for PT Re-Evaluation  09/21/17    Authorization Type  Progress note 3/10    Authorization Time Period  Last goals: 08/19/17       Past Medical History:  Diagnosis Date  . Achalasia   . Barrett's esophagus with dysplasia   . Diabetes mellitus without complication ([place])   . GERD (gastroesophageal reflux disease)   . Hyperlipidemia   . Hypertension   . MRSA infection    lung  . Thyroid disease     Past Surgical History:  Procedure Laterality Date  . ABDOMINAL HYSTERECTOMY    . achalasia    . APPENDECTOMY    . HIATAL HERNIA REPAIR    . KNEE ARTHROPLASTY Right 04/01/2016   Procedure: COMPUTER ASSISTED TOTAL KNEE ARTHROPLASTY;  Surgeon: Dereck Leep, MD;  Location: [facility] ORS;  Service: Orthopedics;  Laterality: Right;  . KNEE ARTHROPLASTY Left 11/30/2016   Procedure: COMPUTER ASSISTED TOTAL KNEE ARTHROPLASTY;  Surgeon: Dereck Leep, MD;  Location: [facility] ORS;  Service: Orthopedics;  Laterality: Left;  . PARATHYROIDECTOMY    . THYROID EXPLORATION    . TONSILLECTOMY      There were no vitals filed for this visit.  Subjective Assessment - 09/07/17 1438    Subjective  Pt reports heavy compliance with HEP over the weekend. She continues to remain motivated to improve her function.     Pertinent History  Pt is a 84 y/o F who reports she has been diagnosed with spinal stenosis but that she is here for her balance.  Pt reports 3 falls in the past month with one of them occurring yesterday and a total of 4 falls in the past 6 months.  Pt says she is weak in her LLE,  causing her to pull on the rail when going up steps, placing a lot of weight through her RUE making her R arm hurt.  Pt denies weakness in LUE. Pt ambulates with her hurry cane at all times. Pt believes her imbalance began in 2017.  Goals: "to be able to walk without feeling like I am going to stumble", working in the yard without losing her balance. Pt reports pain in L buttocks with sit>stand and standing, no pain in sitting. Pain for several months on and off, currently on for 3-4 days. Says it started after sitting on pillow in car.  Pt says she is constipated and is taking dulcolax for this, pt denies night sweats and saddle anesthesia. Pt reports she is tired all of the time and feels "washed out" but denies suicidal thoughts, feeling down, or feeling depressed.  Told pt this therapist would like to notify pt's MD of pt's feeling of "washed out" but pt politely requests for pt to let MD know during her appointment on 7/12. Pt has lost 50 lbs over the course of 2 years (Dr. Manuella Ghazi aware). Pt contributes this to pneumonia and being on antibiotics. EMG on 3/29 with Impression: "This is an abnormal electrodiagnostic study consistent with a 1) generalized polyneuropathy. 2) superimposed chronic L5 radiculopathy."  Currently in Pain?  No/denies       TREATMENT:  Standing with red tband around BLE: -hip abduction x10 bilaterally with cues to improve erect posture for better stance control  -hip extension x10 bilaterally; patient had difficulty keeping knee extended for better hip strengthening;  -Side stepping x10 feet, x2 laps each direction with rail assist for safety;  Supine: -SKTC stretch with contralateral extension 1x30sec bilat (no noted hip etension stretch)  -glute max bridge 2x15 -hooklying reverse curl-up 2x15 sec   -hooklying clamshell c band, ball between feet: 2x15, GreenTB  -hooklying BUE extension with 2000g yellow ball, end-range fleixon to 90 degrees fleixon, 1x15  Seated  Therex: -STS from plinth: 1x10   Gait Based Balance Training:  -Lateral side stepping: 1x9f c SPC minA, intermittently  Balance: Standing on airex: -Wand rotation x20 -wand flexion x20 -narrow stance eyes closed 3x30sec  -normal stance self-toss basket ball 2x20  Firm surface stance: -high knee reciprocal marching 2x10, BUE hold on // bars       PT Short Term Goals - 08/19/17 1351      PT SHORT TERM GOAL #1   Title  Pt will independent complete HEP at least 4 days/wk for carryover between sessions    Time  2    Period  Weeks    Status  Achieved    Target Date  08/19/17        PT Long Term Goals - 08/24/17 1401      PT LONG TERM GOAL #1   Title  [name] 5xSTS will improve to equal to or less than 16 seconds without UE assist to demonstrate improved BLE strength    Baseline  24.98 seconds with UE assist: 08/19/17 20.71    Time  4    Period  Weeks    Status  Partially Met    Target Date  09/21/17      PT LONG TERM GOAL #2   Title  [name] 139m time will improve to equal to or greater than 0.9 m/s to demonstrate improved safety ambulating in the community    Baseline  0.50 m/s;   08/19/17  .55 m/sec    Time  4    Period  Weeks    Status  Partially Met    Target Date  09/21/17      PT LONG TERM GOAL #3   Title  [name] Berg Balance Test score will improve to equal to or greater than 44/56 to demonstrate decreased likelihood of falling    Baseline  32/56 08/26/17 = 40 /56     Time  4    Period  Weeks    Status  Partially Met    Target Date  09/21/17      PT LONG TERM GOAL #4   Title  Pt will be able to walk at least 200 ft without a rest break using SPC to demonstrate improved ambulatory endurance    Baseline  Pt limited to ambulating household distances due to fatigue, able to ambulate 200 feet with spc poor quality gait    Time  4    Period  Weeks    Status  Partially Met    Target Date  09/21/17            Plan - 09/07/17 1445    Clinical Impression  Statement  Session with continued focus on functional balance training, with high variety of scenarios including dynamic balance training on foam, as well as multiplanar gait  based balance training. Pt fatigues quickly in gait based activity, hence rest breaks offered liberally. Pt continues to demonstrate substantial motor control abnormaility, but her compensations have allowed her to continued to progress AMB tolerance in a safe manner. Pt continues to make slow but steady progress toward goals, but remains somewhat limited overall.     Rehab Potential  Good    PT Frequency  2x / week    PT Duration  4 weeks    PT Treatment/Interventions  ADLs/Self Care Home Management;Aquatic Therapy;Biofeedback;Cryotherapy;Electrical Stimulation;Iontophoresis 38m/ml Dexamethasone;Moist Heat;Traction;Ultrasound;DME Instruction;Contrast Bath;Gait training;Stair training;Functional mobility training;Therapeutic activities;Therapeutic exercise;Balance training;Neuromuscular re-education;Patient/family education;Orthotic Fit/Training;Manual techniques;Compression bandaging;Passive range of motion;Dry needling;Energy conservation;Taping    PT Next Visit Plan  Retest 5x STS, BBT if appropriate, 10MWT        Patient will benefit from skilled therapeutic intervention in order to improve the following deficits and impairments:  Abnormal gait, Decreased balance, Decreased endurance, Decreased knowledge of use of DME, Decreased mobility, Decreased range of motion, Decreased safety awareness, Decreased strength, Difficulty walking, Hypomobility, Increased fascial restricitons, Impaired perceived functional ability, Increased muscle spasms, Impaired flexibility, Impaired sensation, Improper body mechanics, Postural dysfunction, Pain, Decreased activity tolerance  Visit Diagnosis: Other abnormalities of gait and mobility  Muscle weakness (generalized)  Unsteadiness on feet     Problem List Patient Active Problem List    Diagnosis Date Noted  . Lumbar radiculopathy 06/24/2017  . Waddling gait 03/19/2017  . Gait abnormality 02/26/2017  . Advanced care planning/counseling discussion 08/04/2016  . S/P total knee replacement using cement, left 04/01/2016  . Primary osteoarthritis of right knee 03/19/2016  . Hyperlipidemia 11/19/2014  . Diabetes mellitus without complication ([place] 032/35/5732 . Nonspecific abnormal results of thyroid function study 07/18/2014  . Hypertension 07/18/2014  . Dysphagia 05/11/2013   2:53 PM, 09/07/17 AEtta Grandchild PT, DPT Physical Therapist - [facility]- [phone]   BEtta Grandchild9/03/2017, 2:52 PM  [place] [facility] [address] [place] [address]Phone: [phone]  Fax:  [phone] Name: LSMRITI BARKOWMRN: [id number] of Birth: 907/05/1928

## 2017-09-09 ENCOUNTER — Encounter: Payer: Self-pay | Admitting: Physical Therapy

## 2017-09-09 ENCOUNTER — Ambulatory Visit: Payer: PPO | Admitting: Physical Therapy

## 2017-09-09 DIAGNOSIS — R2681 Unsteadiness on feet: Secondary | ICD-10-CM

## 2017-09-09 DIAGNOSIS — R2689 Other abnormalities of gait and mobility: Secondary | ICD-10-CM | POA: Diagnosis not present

## 2017-09-09 DIAGNOSIS — M6281 Muscle weakness (generalized): Secondary | ICD-10-CM

## 2017-09-09 NOTE — Therapy (Addendum)
[place] MAIN [facility] SERVICES [address] [place], [place], [address] Phone: [phone]   Fax:  [phone]  Physical Therapy Treatment  Patient Details  Name: Deanna Bird MRN: [id number] Date of Birth: [birth year] Referring Provider: Vladimir Crofts, MD    Encounter Date: 09/09/2017  PT End of Session - 09/09/17 1254    Visit Number  15    Number of Visits  25    Date for PT Re-Evaluation  09/21/17    Authorization Type  Progress note 4/10    Authorization Time Period  Last goals: 08/19/17    PT Start Time  1300    PT Stop Time  1345    PT Time Calculation (min)  45 min    Equipment Utilized During Treatment  Gait belt    Activity Tolerance  Patient tolerated treatment well    Behavior During Therapy  [facility] for tasks assessed/performed       Past Medical History:  Diagnosis Date  . Achalasia   . Barrett's esophagus with dysplasia   . Diabetes mellitus without complication ([place])   . GERD (gastroesophageal reflux disease)   . Hyperlipidemia   . Hypertension   . MRSA infection    lung  . Thyroid disease     Past Surgical History:  Procedure Laterality Date  . ABDOMINAL HYSTERECTOMY    . achalasia    . APPENDECTOMY    . HIATAL HERNIA REPAIR    . KNEE ARTHROPLASTY Right 04/01/2016   Procedure: COMPUTER ASSISTED TOTAL KNEE ARTHROPLASTY;  Surgeon: Dereck Leep, MD;  Location: [facility] ORS;  Service: Orthopedics;  Laterality: Right;  . KNEE ARTHROPLASTY Left 11/30/2016   Procedure: COMPUTER ASSISTED TOTAL KNEE ARTHROPLASTY;  Surgeon: Dereck Leep, MD;  Location: [facility] ORS;  Service: Orthopedics;  Laterality: Left;  . PARATHYROIDECTOMY    . THYROID EXPLORATION    . TONSILLECTOMY      There were no vitals filed for this visit.  Subjective Assessment - 09/09/17 1304    Subjective  Patient reports she is doing well today; denies any pain or new falls. She did not perform HEP yesterday.     Pertinent History  Pt is a 84 y/o F who  reports she has been diagnosed with spinal stenosis but that she is here for her balance.  Pt reports 3 falls in the past month with one of them occurring yesterday and a total of 4 falls in the past 6 months.  Pt says she is weak in her LLE, causing her to pull on the rail when going up steps, placing a lot of weight through her RUE making her R arm hurt.  Pt denies weakness in LUE. Pt ambulates with her hurry cane at all times. Pt believes her imbalance began in 2017.  Goals: "to be able to walk without feeling like I am going to stumble", working in the yard without losing her balance. Pt reports pain in L buttocks with sit>stand and standing, no pain in sitting. Pain for several months on and off, currently on for 3-4 days. Says it started after sitting on pillow in car.  Pt says she is constipated and is taking dulcolax for this, pt denies night sweats and saddle anesthesia. Pt reports she is tired all of the time and feels "washed out" but denies suicidal thoughts, feeling down, or feeling depressed.  Told pt this therapist would like to notify pt's MD of pt's feeling of "washed out" but pt  politely requests for pt to let MD know during her appointment on 7/12. Pt has lost 50 lbs over the course of 2 years (Dr. Manuella Ghazi aware). Pt contributes this to pneumonia and being on antibiotics. EMG on 3/29 with Impression: "This is an abnormal electrodiagnostic study consistent with a 1) generalized polyneuropathy. 2) superimposed chronic L5 radiculopathy."     Limitations  Lifting;Standing;Walking;House hold activities    How long can you stand comfortably?  10 minutes without UE support    How long can you walk comfortably?  household distances due to fatigue    Diagnostic tests  MRI on 03/29/17: "L4-5 severe facet arthropathy with anterolisthesis. Spinal stenosis is advanced and there is left L4 foraminal impingement. Elsewhere mild for age degenerative changes as described above. Pt had EMG done on 3/29: "EMG & NCV  Findings: Evaluation of the Left peroneal motor nerve showed no response (Ankle) and no response (Ankle). The Left tibial motor nerve showed prolonged distal onset latency (7.0 ms) and reduced amplitude (1.0 mV). The Left ulnar motor nerve showed prolonged distal onset latency (5.0 ms). The Left radial sensory nerve showed prolonged distal peak latency (2.6 ms). The Left superficial peroneal sensory nerve showed no response (14 cm). The Left sural sensory nerve showed no response (Calf). The Left ulnar sensory nerve showed prolonged distal peak latency (3.8 ms) and decreased conduction velocity (Wrist-5th Digit, 32 m/s)".     Patient Stated Goals  see above    Currently in Pain?  No/denies      Treatment Supine: LLE SAQ with ball under leg, 2x15 with min VCs for technique and positioning; LLE SLR flexion 2x15 reps with min VCs for positioning;    Sidelying: LLE hip abduction clamshells 2x15; VCs for keeping pelvis forward and min A for keeping ankles together  Prone: SLR LLE only  2x10 reps Glute kickbacks LLE only 2x10 reps  VCs required for proper technique and positioning; min A required for initiating beginning reps as well as palpating muscle contraction for pt to know where muscles are working; demonstrated increased difficulty with these exercises  Seated  LAQ LLE only 3 sec hold x12 reps with VCs to increase hold time for better knee control;  BLE hip abduction/ER with red tband 2x15 with VCs to keep feet together for better hip strengthening LLE marching x15 reps with VCs to avoid posterior trunk lean Alternating marching 2x15 reps each leg; VCs to engage core and avoid posterior trunk lean and leaning back onto hands   Leg press: BLE plate 75# [id number], VCs for slow eccentric return for better strengthening and control LLE only 45# x15, VCs for continuing slow return as well as keeping pelvis in neutral to not utilize back muscles to push through  leg                 PT Education - 09/09/17 1253    Education Details  LE strength, stretches    Person(s) Educated  Patient    Methods  Explanation;Demonstration;Verbal cues    Comprehension  Verbalized understanding;Returned demonstration;Verbal cues required;Need further instruction       PT Short Term Goals - 08/19/17 1351      PT SHORT TERM GOAL #1   Title  Pt will independent complete HEP at least 4 days/wk for carryover between sessions    Time  2    Period  Weeks    Status  Achieved    Target Date  08/19/17  PT Long Term Goals - 08/24/17 1401      PT LONG TERM GOAL #1   Title  [name] 5xSTS will improve to equal to or less than 16 seconds without UE assist to demonstrate improved BLE strength    Baseline  24.98 seconds with UE assist: 08/19/17 20.71    Time  4    Period  Weeks    Status  Partially Met    Target Date  09/21/17      PT LONG TERM GOAL #2   Title  [name] 26mT time will improve to equal to or greater than 0.9 m/s to demonstrate improved safety ambulating in the community    Baseline  0.50 m/s;   08/19/17  .55 m/sec    Time  4    Period  Weeks    Status  Partially Met    Target Date  09/21/17      PT LONG TERM GOAL #3   Title  [name] Berg Balance Test score will improve to equal to or greater than 44/56 to demonstrate decreased likelihood of falling    Baseline  32/56 08/26/17 = 40 /56     Time  4    Period  Weeks    Status  Partially Met    Target Date  09/21/17      PT LONG TERM GOAL #4   Title  Pt will be able to walk at least 200 ft without a rest break using SPC to demonstrate improved ambulatory endurance    Baseline  Pt limited to ambulating household distances due to fatigue, able to ambulate 200 feet with spc poor quality gait    Time  4    Period  Weeks    Status  Partially Met    Target Date  09/21/17            Plan - 09/09/17 1445    Clinical Impression Statement  Session focused on LE strengthening,  particularly the hip and knee musculature. Pt demonstrated difficulty with prone hip extension strengthening exercises and required min A for proper technique; VCs for where the pt should feel muscle activation as well. Pt required rest breaks between sets due to muscle fatigue. Pt tolerated strengthening in various positions including supine, sidelying, prone, and seated; required VCs throughout exercises for proper technique. Pt will continue to benefit from skilled PT intervention for improvements in balance, strength, and gait safety.     Rehab Potential  Good    PT Frequency  2x / week    PT Duration  4 weeks    PT Treatment/Interventions  ADLs/Self Care Home Management;Aquatic Therapy;Biofeedback;Cryotherapy;Electrical Stimulation;Iontophoresis 474mml Dexamethasone;Moist Heat;Traction;Ultrasound;DME Instruction;Contrast Bath;Gait training;Stair training;Functional mobility training;Therapeutic activities;Therapeutic exercise;Balance training;Neuromuscular re-education;Patient/family education;Orthotic Fit/Training;Manual techniques;Compression bandaging;Passive range of motion;Dry needling;Energy conservation;Taping    PT Next Visit Plan  Retest 5x STS, BBT if appropriate, 10MWT     PT Home Exercise Plan  sit<>stand with UE assist, tandem stance, romberg with horizontal head turns, marching in standing    Consulted and Agree with Plan of Care  Patient       Patient will benefit from skilled therapeutic intervention in order to improve the following deficits and impairments:  Abnormal gait, Decreased balance, Decreased endurance, Decreased knowledge of use of DME, Decreased mobility, Decreased range of motion, Decreased safety awareness, Decreased strength, Difficulty walking, Hypomobility, Increased fascial restricitons, Impaired perceived functional ability, Increased muscle spasms, Impaired flexibility, Impaired sensation, Improper body mechanics, Postural dysfunction, Pain, Decreased activity  tolerance  Visit  Diagnosis: Other abnormalities of gait and mobility  Muscle weakness (generalized)  Unsteadiness on feet     Problem List Patient Active Problem List   Diagnosis Date Noted  . Lumbar radiculopathy 06/24/2017  . Waddling gait 03/19/2017  . Gait abnormality 02/26/2017  . Advanced care planning/counseling discussion 08/04/2016  . S/P total knee replacement using cement, left 04/01/2016  . Primary osteoarthritis of right knee 03/19/2016  . Hyperlipidemia 11/19/2014  . Diabetes mellitus without complication ([place]) 68/03/2120  . Nonspecific abnormal results of thyroid function study 07/18/2014  . Hypertension 07/18/2014  . Dysphagia 05/11/2013   Harriet Masson, SPT This entire session was performed under direct supervision and direction of a licensed therapist/therapist assistant . I have personally read, edited and approve of the note as written.  Trotter,Margaret PT, DPT 09/09/2017, 5:03 PM  [place] MAIN [facility] SERVICES [address] [place], [place], [address] Phone: [phone]   Fax:  [phone]  Name: Deanna Bird MRN: [id number] Date of Birth: August 28, 1928

## 2017-09-13 ENCOUNTER — Ambulatory Visit: Payer: PPO | Admitting: Physical Therapy

## 2017-09-13 DIAGNOSIS — L821 Other seborrheic keratosis: Secondary | ICD-10-CM | POA: Diagnosis not present

## 2017-09-13 DIAGNOSIS — D485 Neoplasm of uncertain behavior of skin: Secondary | ICD-10-CM | POA: Diagnosis not present

## 2017-09-13 DIAGNOSIS — L578 Other skin changes due to chronic exposure to nonionizing radiation: Secondary | ICD-10-CM | POA: Diagnosis not present

## 2017-09-13 DIAGNOSIS — L570 Actinic keratosis: Secondary | ICD-10-CM | POA: Diagnosis not present

## 2017-09-14 ENCOUNTER — Ambulatory Visit: Payer: PPO | Admitting: Physical Therapy

## 2017-09-16 ENCOUNTER — Ambulatory Visit: Payer: PPO | Admitting: Physical Therapy

## 2017-09-16 DIAGNOSIS — R2681 Unsteadiness on feet: Secondary | ICD-10-CM

## 2017-09-16 DIAGNOSIS — M6281 Muscle weakness (generalized): Secondary | ICD-10-CM

## 2017-09-16 DIAGNOSIS — R2689 Other abnormalities of gait and mobility: Secondary | ICD-10-CM

## 2017-09-16 NOTE — Therapy (Signed)
[place] MAIN [facility] SERVICES [address] [place], [place], [address] Phone: [phone]   Fax:  [phone]  Physical Therapy Treatment  Patient Details  Name: Deanna Bird MRN: [id number] Date of Birth: 01/10/1928 Referring Provider: Vladimir Crofts, MD    Encounter Date: 09/16/2017  PT End of Session - 09/16/17 1307    Visit Number  16    Number of Visits  25    Date for PT Re-Evaluation  09/21/17    Authorization Type  Progress note 4/10    Authorization Time Period  Last goals: 08/19/17    PT Start Time  1301    PT Stop Time  1341    PT Time Calculation (min)  40 min    Activity Tolerance  Patient tolerated treatment well    Behavior During Therapy  [facility] for tasks assessed/performed       Past Medical History:  Diagnosis Date  . Achalasia   . Barrett's esophagus with dysplasia   . Diabetes mellitus without complication ([place])   . GERD (gastroesophageal reflux disease)   . Hyperlipidemia   . Hypertension   . MRSA infection    lung  . Thyroid disease     Past Surgical History:  Procedure Laterality Date  . ABDOMINAL HYSTERECTOMY    . achalasia    . APPENDECTOMY    . HIATAL HERNIA REPAIR    . KNEE ARTHROPLASTY Right 04/01/2016   Procedure: COMPUTER ASSISTED TOTAL KNEE ARTHROPLASTY;  Surgeon: Dereck Leep, MD;  Location: [facility] ORS;  Service: Orthopedics;  Laterality: Right;  . KNEE ARTHROPLASTY Left 11/30/2016   Procedure: COMPUTER ASSISTED TOTAL KNEE ARTHROPLASTY;  Surgeon: Dereck Leep, MD;  Location: [facility] ORS;  Service: Orthopedics;  Laterality: Left;  . PARATHYROIDECTOMY    . THYROID EXPLORATION    . TONSILLECTOMY      There were no vitals filed for this visit.  Subjective Assessment - 09/16/17 1305    Subjective  Pt reports doing well today. She says HEP conitnues to go well. Pt report some improvement in balance, but today balances acutely worse to a mild degree.     Pertinent History  Pt is a 84 y/o F who  reports she has been diagnosed with spinal stenosis but that she is here for her balance.  Pt reports 3 falls in the past month with one of them occurring yesterday and a total of 4 falls in the past 6 months.  Pt says she is weak in her LLE, causing her to pull on the rail when going up steps, placing a lot of weight through her RUE making her R arm hurt.  Pt denies weakness in LUE. Pt ambulates with her hurry cane at all times. Pt believes her imbalance began in 2017.  Goals: "to be able to walk without feeling like I am going to stumble", working in the yard without losing her balance. Pt reports pain in L buttocks with sit>stand and standing, no pain in sitting. Pain for several months on and off, currently on for 3-4 days. Says it started after sitting on pillow in car.  Pt says she is constipated and is taking dulcolax for this, pt denies night sweats and saddle anesthesia. Pt reports she is tired all of the time and feels "washed out" but denies suicidal thoughts, feeling down, or feeling depressed.  Told pt this therapist would like to notify pt's MD of pt's feeling of "washed out" but pt politely requests  for pt to let MD know during her appointment on 7/12. Pt has lost 50 lbs over the course of 2 years (Dr. Manuella Ghazi aware). Pt contributes this to pneumonia and being on antibiotics. EMG on 3/29 with Impression: "This is an abnormal electrodiagnostic study consistent with a 1) generalized polyneuropathy. 2) superimposed chronic L5 radiculopathy."       Treatment This Session   Supine: -LLE SAQ, foam rolled supported, 2x10 c 4lb weights -Bridge, knees starting at 90 degree: 2x15 -LLE hip abduction clamshells 2x15 RedTB, ball between feet, VC for symmetrical hp excursion.  -DKTC stretch 2x30sec    Seated  -Hands Free STS from [place] 2x10 -LAQ 1x12 bilat 0lb; 1x10 c 2lb weights (VC to maintain Lt plantar flexion and avoid sciatic neural tension in knee/calf  -BLE hip abduction/ER with red tband 2x15  with VCs to keep feet together for better hip strengthening -LLE marching 2x15 reps with VCs to avoid posterior trunk lean -Alternating marching 2x15 reps each leg, 2lb ankle weights; VCs to engage core and avoid posterior trunk lean and leaning back onto hands  -Leg press: BLE plate 75# [id number], LLE only 45# 2x10   Prone: *prone position avoided after review of MRI report from 3/25 "IMPRESSION: 1. L4-5 severe facet arthropathy with anterolisthesis. Spinal stenosis is advanced and there is left L4 foraminal impingement. 2. Elsewhere mild for age degenerative changes as described above"     PT Short Term Goals - 08/19/17 1351      PT SHORT TERM GOAL #1   Title  Pt will independent complete HEP at least 4 days/wk for carryover between sessions    Time  2    Period  Weeks    Status  Achieved    Target Date  08/19/17        PT Long Term Goals - 08/24/17 1401      PT LONG TERM GOAL #1   Title  [name] 5xSTS will improve to equal to or less than 16 seconds without UE assist to demonstrate improved BLE strength    Baseline  24.98 seconds with UE assist: 08/19/17 20.71    Time  4    Period  Weeks    Status  Partially Met    Target Date  09/21/17      PT LONG TERM GOAL #2   Title  [name] 67mT time will improve to equal to or greater than 0.9 m/s to demonstrate improved safety ambulating in the community    Baseline  0.50 m/s;   08/19/17  .55 m/sec    Time  4    Period  Weeks    Status  Partially Met    Target Date  09/21/17      PT LONG TERM GOAL #3   Title  [name] Berg Balance Test score will improve to equal to or greater than 44/56 to demonstrate decreased likelihood of falling    Baseline  32/56 08/26/17 = 40 /56     Time  4    Period  Weeks    Status  Partially Met    Target Date  09/21/17      PT LONG TERM GOAL #4   Title  Pt will be able to walk at least 200 ft without a rest break using SPC to demonstrate improved ambulatory endurance    Baseline  Pt limited to ambulating  household distances due to fatigue, able to ambulate 200 feet with spc poor quality gait    Time  4  Period  Weeks    Status  Partially Met    Target Date  09/21/17            Plan - 09/16/17 1316    Clinical Impression Statement  Continued with current program. Pt able to progress resistance training in some areas overall. PT c/o neural tension type symptoms with Lt LAQ, hence asked to modify ankle angle to avoid tension, which she is able to do. Pt progressing well toward goals overall.     Rehab Potential  Good    PT Frequency  2x / week    PT Duration  4 weeks    PT Treatment/Interventions  ADLs/Self Care Home Management;Aquatic Therapy;Biofeedback;Cryotherapy;Electrical Stimulation;Iontophoresis 72m/ml Dexamethasone;Moist Heat;Traction;Ultrasound;DME Instruction;Contrast Bath;Gait training;Stair training;Functional mobility training;Therapeutic activities;Therapeutic exercise;Balance training;Neuromuscular re-education;Patient/family education;Orthotic Fit/Training;Manual techniques;Compression bandaging;Passive range of motion;Dry needling;Energy conservation;Taping    PT Next Visit Plan  Retest 5x STS, BBT if appropriate, 10MWT     PT Home Exercise Plan  sit<>stand with UE assist, tandem stance, romberg with horizontal head turns, marching in standing       Patient will benefit from skilled therapeutic intervention in order to improve the following deficits and impairments:  Abnormal gait, Decreased balance, Decreased endurance, Decreased knowledge of use of DME, Decreased mobility, Decreased range of motion, Decreased safety awareness, Decreased strength, Difficulty walking, Hypomobility, Increased fascial restricitons, Impaired perceived functional ability, Increased muscle spasms, Impaired flexibility, Impaired sensation, Improper body mechanics, Postural dysfunction, Pain, Decreased activity tolerance  Visit Diagnosis: Other abnormalities of gait and mobility  Muscle  weakness (generalized)  Unsteadiness on feet     Problem List Patient Active Problem List   Diagnosis Date Noted  . Lumbar radiculopathy 06/24/2017  . Waddling gait 03/19/2017  . Gait abnormality 02/26/2017  . Advanced care planning/counseling discussion 08/04/2016  . S/P total knee replacement using cement, left 04/01/2016  . Primary osteoarthritis of right knee 03/19/2016  . Hyperlipidemia 11/19/2014  . Diabetes mellitus without complication ([place] 025/52/5894 . Nonspecific abnormal results of thyroid function study 07/18/2014  . Hypertension 07/18/2014  . Dysphagia 05/11/2013  1:44 PM, 09/16/17 AEtta Grandchild PT, DPT Physical Therapist - [facility]- [phone]   BEtta Grandchild9/12/2017, 1:23 PM  [place] [facility] [address] [address]Phone: [phone]  Fax:  [phone] Name: Deanna YEHMRN: [id number] of Birth: 909-12-1928

## 2017-09-21 ENCOUNTER — Ambulatory Visit: Payer: PPO | Admitting: Physical Therapy

## 2017-09-23 ENCOUNTER — Ambulatory Visit: Payer: PPO | Admitting: Physical Therapy

## 2017-09-28 ENCOUNTER — Encounter: Payer: Self-pay | Admitting: Physical Therapy

## 2017-09-28 ENCOUNTER — Ambulatory Visit: Payer: PPO | Admitting: Physical Therapy

## 2017-09-28 DIAGNOSIS — R2689 Other abnormalities of gait and mobility: Secondary | ICD-10-CM

## 2017-09-28 DIAGNOSIS — R2681 Unsteadiness on feet: Secondary | ICD-10-CM

## 2017-09-28 DIAGNOSIS — M6281 Muscle weakness (generalized): Secondary | ICD-10-CM

## 2017-09-28 NOTE — Therapy (Signed)
[place] MAIN [facility] SERVICES [address] [place], [place], [address] Phone: [phone]   Fax:  [phone]  Physical Therapy Treatment  Physical Therapy Progress Note   Dates of reporting period  08/19/17  to  09/28/17   Patient Details  Name: Deanna Bird MRN: [id number] Date of Birth: [birth year] Referring Provider: Vladimir Crofts, MD    Encounter Date: 09/28/2017  PT End of Session - 09/28/17 1257    Visit Number  17    Number of Visits  33    Date for PT Re-Evaluation  10/26/17    Authorization Type  Progress note 7/10; next visit progress note 1/10    Authorization Time Period  Next visit start of reporting period 9/24    PT Start Time  1300    PT Stop Time  1343    PT Time Calculation (min)  43 min    Equipment Utilized During Treatment  Gait belt    Activity Tolerance  Patient tolerated treatment well    Behavior During Therapy  WFL for tasks assessed/performed       Past Medical History:  Diagnosis Date  . Achalasia   . Barrett's esophagus with dysplasia   . Diabetes mellitus without complication ([place])   . GERD (gastroesophageal reflux disease)   . Hyperlipidemia   . Hypertension   . MRSA infection    lung  . Thyroid disease     Past Surgical History:  Procedure Laterality Date  . ABDOMINAL HYSTERECTOMY    . achalasia    . APPENDECTOMY    . HIATAL HERNIA REPAIR    . KNEE ARTHROPLASTY Right 04/01/2016   Procedure: COMPUTER ASSISTED TOTAL KNEE ARTHROPLASTY;  Surgeon: Dereck Leep, MD;  Location: [facility] ORS;  Service: Orthopedics;  Laterality: Right;  . KNEE ARTHROPLASTY Left 11/30/2016   Procedure: COMPUTER ASSISTED TOTAL KNEE ARTHROPLASTY;  Surgeon: Dereck Leep, MD;  Location: [facility] ORS;  Service: Orthopedics;  Laterality: Left;  . PARATHYROIDECTOMY    . THYROID EXPLORATION    . TONSILLECTOMY      There were no vitals filed for this visit.  Subjective Assessment - 09/28/17 1305    Subjective  Pt reports  she is doing well.  She believes that since starting therapy she has improved 50% with her balance.  Pt has been told there is something off in her labs for her thyroid and will be following up with her doctor about this, she believes this is what is making her feel tired.  Pt believes that her walking is "easier" since starting therapy.  Pt says that her L hip has been sore for 2.5 years and hurts mainly when she lays on it and might ask Dr. Manuella Ghazi about getting an x-ray.  She says she has fallen on her L hip several times with the last time being 6 months ago.     Pertinent History  Pt is a 84 y/o F who reports she has been diagnosed with spinal stenosis but that she is here for her balance.  Pt reports 3 falls in the past month with one of them occurring yesterday and a total of 4 falls in the past 6 months.  Pt says she is weak in her LLE, causing her to pull on the rail when going up steps, placing a lot of weight through her RUE making her R arm hurt.  Pt denies weakness in LUE. Pt ambulates with her hurry cane at all times. Pt  believes her imbalance began in 2017.  Goals: "to be able to walk without feeling like I am going to stumble", working in the yard without losing her balance. Pt reports pain in L buttocks with sit>stand and standing, no pain in sitting. Pain for several months on and off, currently on for 3-4 days. Says it started after sitting on pillow in car.  Pt says she is constipated and is taking dulcolax for this, pt denies night sweats and saddle anesthesia. Pt reports she is tired all of the time and feels "washed out" but denies suicidal thoughts, feeling down, or feeling depressed.  Told pt this therapist would like to notify pt's MD of pt's feeling of "washed out" but pt politely requests for pt to let MD know during her appointment on 82/12. Pt has lost 50 lbs over the course of 2 years (Dr. Manuella Ghazi aware). Pt contributes this to pneumonia and being on antibiotics. EMG on 3/29 with Impression:  "This is an abnormal electrodiagnostic study consistent with a 1) generalized polyneuropathy. 2) superimposed chronic L5 radiculopathy."     Currently in Pain?  No/denies         [facility] PT Assessment - 09/28/17 1325      Balance   Balance Assessed  Yes      Standardized Balance Assessment   Standardized Balance Assessment  Berg Balance Test      Berg Balance Test   Sit to Stand  Able to stand  independently using hands    Standing Unsupported  Able to stand safely 2 minutes    Sitting with Back Unsupported but Feet Supported on Floor or Stool  Able to sit safely and securely 2 minutes    Stand to Sit  Controls descent by using hands    Transfers  Able to transfer safely, definite need of hands    Standing Unsupported with Eyes Closed  Able to stand 10 seconds with supervision    Standing Ubsupported with Feet Together  Able to place feet together independently and stand 1 minute safely    From Standing, Reach Forward with Outstretched Arm  Can reach confidently >25 cm (10")    From Standing Position, Pick up Object from Floor  Able to pick up shoe safely and easily    From Standing Position, Turn to Look Behind Over each Shoulder  Looks behind from both sides and weight shifts well    Turn 360 Degrees  Able to turn 360 degrees safely but slowly    Standing Unsupported, Alternately Place Feet on Step/Stool  Able to complete 4 steps without aid or supervision    Standing Unsupported, One Foot in [employer] balance while stepping or standing    Standing on One Leg  Unable to try or needs assist to prevent fall    Total Score  40          TREATMENT  5xSTS: 16.28 sec with use of 1UE  84mT: 0.72 m/s  Berg Balance Test: 40/56  Ambulatory endurance: able to ambulate 200 ft with SPC but poor gait quality with pt veering to the R with fatigue  Alternating toe taps x15 each LE with intermittent UE support  Tandem balance 2x30 seconds with intermittent UE support  Forward and backward  stepping over hurdle x10 each direction with light 1UE support  Side stepping over hurdle x10 each direction with light 1UE support               Patient's condition has  the potential to improve in response to therapy. Maximum improvement is yet to be obtained. The anticipated improvement is attainable and reasonable in a generally predictable time.        PT Education - 09/28/17 1257    Education Details  Exercise technique; results of goal review; will assess hip at next visit    Person(s) Educated  Patient    Methods  Explanation;Demonstration;Verbal cues    Comprehension  Verbalized understanding;Returned demonstration;Verbal cues required;Need further instruction       PT Short Term Goals - 08/19/17 1351      PT SHORT TERM GOAL #1   Title  Pt will independent complete HEP at least 4 days/wk for carryover between sessions    Time  2    Period  Weeks    Status  Achieved    Target Date  08/19/17        PT Long Term Goals - 09/28/17 1315      PT LONG TERM GOAL #1   Title  [name] 5xSTS will improve to equal to or less than 16 seconds without UE assist to demonstrate improved BLE strength    Baseline  24.98 seconds with UE assist: 08/19/17 20.71; 9/24: 16.28 sec with 1UE support    Time  4    Period  Weeks    Status  Partially Met      PT LONG TERM GOAL #2   Title  [name] 30mT time will improve to equal to or greater than 0.9 m/s to demonstrate improved safety ambulating in the community    Baseline  0.50 m/s;   08/19/17  .55 m/sec; 9/24 using SPC: 0.72 m/s    Time  4    Period  Weeks    Status  Partially Met      PT LONG TERM GOAL #3   Title  [name] Berg Balance Test score will improve to equal to or greater than 44/56 to demonstrate decreased likelihood of falling    Baseline  32/56 08/26/17 = 40 /56; 9/24: 40/56    Time  4    Period  Weeks    Status  On-going      PT LONG TERM GOAL #4   Title  Pt will be able to walk at least 200 ft without a rest break  using SPC to demonstrate improved ambulatory endurance    Baseline  Pt limited to ambulating household distances due to fatigue, able to ambulate 200 feet with spc poor quality gait; 9/24: (same, pt veering to the R)    Time  4    Period  Weeks    Status  Partially Met            Plan - 09/28/17 1309    Clinical Impression Statement  [name] 5xSTS time improved since last visit but the pt is unable to perform sit<>stand without use of at least 1UE.  [name] gait speed has improved since last assessment but pt has not yet met her goal of 0.9 m/s.  Pt was able to ambulate 200 ft but demonstrated veering to R with fatigue. [name] score on the Berg Balance Test continues to suggest that pt is at an increased risk of falling.  She specifically has difficulty with SLS or narrow BOS activities.  Pt will benefit from continued skilled PT interventions for improved balance and strength.      Rehab Potential  Good    PT Frequency  2x / week    PT Duration  4 weeks    PT Treatment/Interventions  ADLs/Self Care Home Management;Aquatic Therapy;Biofeedback;Cryotherapy;Electrical Stimulation;Iontophoresis 71m/ml Dexamethasone;Moist Heat;Traction;Ultrasound;DME Instruction;Contrast Bath;Gait training;Stair training;Functional mobility training;Therapeutic activities;Therapeutic exercise;Balance training;Neuromuscular re-education;Patient/family education;Orthotic Fit/Training;Manual techniques;Compression bandaging;Passive range of motion;Dry needling;Energy conservation;Taping    PT Next Visit Plan  evaluate L hip    PT Home Exercise Plan  sit<>stand with UE assist, tandem stance, romberg with horizontal head turns, marching in standing       Patient will benefit from skilled therapeutic intervention in order to improve the following deficits and impairments:  Abnormal gait, Decreased balance, Decreased endurance, Decreased knowledge of use of DME, Decreased mobility, Decreased range of motion, Decreased safety  awareness, Decreased strength, Difficulty walking, Hypomobility, Increased fascial restricitons, Impaired perceived functional ability, Increased muscle spasms, Impaired flexibility, Impaired sensation, Improper body mechanics, Postural dysfunction, Pain, Decreased activity tolerance  Visit Diagnosis: Other abnormalities of gait and mobility  Muscle weakness (generalized)  Unsteadiness on feet     Problem List Patient Active Problem List   Diagnosis Date Noted  . Lumbar radiculopathy 06/24/2017  . Waddling gait 03/19/2017  . Gait abnormality 02/26/2017  . Advanced care planning/counseling discussion 08/04/2016  . S/P total knee replacement using cement, left 04/01/2016  . Primary osteoarthritis of right knee 03/19/2016  . Hyperlipidemia 11/19/2014  . Diabetes mellitus without complication ([place] 030/05/1100 . Nonspecific abnormal results of thyroid function study 07/18/2014  . Hypertension 07/18/2014  . Dysphagia 05/11/2013    ACollie SiadPT, DPT 09/28/2017, 1:44 PM  [place] [facility] [address] [address]Phone: [phone]  Fax:  [phone] Name: LKAULA KLENKEMRN: [id number] of Birth: [birth year]

## 2017-09-30 ENCOUNTER — Encounter: Payer: Self-pay | Admitting: Physical Therapy

## 2017-09-30 ENCOUNTER — Ambulatory Visit: Payer: PPO | Admitting: Physical Therapy

## 2017-09-30 DIAGNOSIS — R2689 Other abnormalities of gait and mobility: Secondary | ICD-10-CM | POA: Diagnosis not present

## 2017-09-30 DIAGNOSIS — R2681 Unsteadiness on feet: Secondary | ICD-10-CM

## 2017-09-30 DIAGNOSIS — M6281 Muscle weakness (generalized): Secondary | ICD-10-CM

## 2017-09-30 NOTE — Therapy (Signed)
[place] MAIN [facility] SERVICES [address] [place], [place], [address] Phone: [phone]   Fax:  [phone]  Physical Therapy Treatment  Patient Details  Name: Deanna Bird MRN: [id number] Date of Birth: October 23, 1928 Referring Provider (PT): Vladimir Crofts, MD    Encounter Date: 09/30/2017  PT End of Session - 09/30/17 1303    Visit Number  18    Number of Visits  33    Date for PT Re-Evaluation  10/26/17    Authorization Type  next visit progress note 1/10    Authorization Time Period  Start of reporting period 9/24    PT Start Time  1301    PT Stop Time  1344    PT Time Calculation (min)  43 min    Equipment Utilized During Treatment  Gait belt    Activity Tolerance  Patient tolerated treatment well    Behavior During Therapy  [facility] for tasks assessed/performed       Past Medical History:  Diagnosis Date  . Achalasia   . Barrett's esophagus with dysplasia   . Diabetes mellitus without complication ([place])   . GERD (gastroesophageal reflux disease)   . Hyperlipidemia   . Hypertension   . MRSA infection    lung  . Thyroid disease     Past Surgical History:  Procedure Laterality Date  . ABDOMINAL HYSTERECTOMY    . achalasia    . APPENDECTOMY    . HIATAL HERNIA REPAIR    . KNEE ARTHROPLASTY Right 04/01/2016   Procedure: COMPUTER ASSISTED TOTAL KNEE ARTHROPLASTY;  Surgeon: Dereck Leep, MD;  Location: [facility] ORS;  Service: Orthopedics;  Laterality: Right;  . KNEE ARTHROPLASTY Left 11/30/2016   Procedure: COMPUTER ASSISTED TOTAL KNEE ARTHROPLASTY;  Surgeon: Dereck Leep, MD;  Location: [facility] ORS;  Service: Orthopedics;  Laterality: Left;  . PARATHYROIDECTOMY    . THYROID EXPLORATION    . TONSILLECTOMY      There were no vitals filed for this visit.  Subjective Assessment - 09/30/17 1307    Subjective  Pt reports she is doing well today and denies any new falls since last session.  Pt is still having pain lying on L side,  sometimes when walking.  Has fallen on her L hip in the past.  No current pain.     Pertinent History  Pt is a 84 y/o F who reports she has been diagnosed with spinal stenosis but that she is here for her balance.  Pt reports 3 falls in the past month with one of them occurring yesterday and a total of 4 falls in the past 6 months.  Pt says she is weak in her LLE, causing her to pull on the rail when going up steps, placing a lot of weight through her RUE making her R arm hurt.  Pt denies weakness in LUE. Pt ambulates with her hurry cane at all times. Pt believes her imbalance began in 2017.  Goals: "to be able to walk without feeling like I am going to stumble", working in the yard without losing her balance. Pt reports pain in L buttocks with sit>stand and standing, no pain in sitting. Pain for several months on and off, currently on for 3-4 days. Says it started after sitting on pillow in car.  Pt says she is constipated and is taking dulcolax for this, pt denies night sweats and saddle anesthesia. Pt reports she is tired all of the time and feels "washed out"  but denies suicidal thoughts, feeling down, or feeling depressed.  Told pt this therapist would like to notify pt's MD of pt's feeling of "washed out" but pt politely requests for pt to let MD know during her appointment on 82. Pt has lost 50 lbs over the course of 2 years (Dr. Manuella Ghazi aware). Pt contributes this to pneumonia and being on antibiotics. EMG on 3/29 with Impression: "This is an abnormal electrodiagnostic study consistent with a 1) generalized polyneuropathy. 2) superimposed chronic L5 radiculopathy."     Currently in Pain?  No/denies        TREATMENT   Assessment of L hip as pt reports pain in this region for several years, especially when lying on her L side. Denies numbness or tingling. Pain when walking short distances sometimes. Worst pain: 4/10, current pain: 0/10.   Palpation: no TTP appreciated   Hip AROM in sitting (in  deg):  IR: 12, 32  ER: 32, 30   FABER: neg Bil  FADDIR: neg Bil   Ober's: negative   Hip Abd testing in sidelying: 2/5 on L, 3-/5 on R   SIJ Compression and Distraction Test: neg   Hamstring length: lacking 35 deg on L, lacking 40 deg on R   Hip mobility: hypomobility with lateral mobs to L hip   Hooklying L hip lateral mobs using belt 4x30 seconds   LAD to L hip 3x30 seconds   Hooklying isometric clamshells with band around knees x10 second holds x5, pt reports "feeling it" in her L hip after 5 reps so this was discontinued.   Supine isometric clamshells with band around knees x10 second holds   Bridges with cues for glute activation 2x15   Marching in hooklying x20 each LE                         PT Education - 09/30/17 1303    Education Details  Exercise technique    Person(s) Educated  Patient    Methods  Explanation;Demonstration;Verbal cues    Comprehension  Verbalized understanding;Returned demonstration;Verbal cues required;Need further instruction       PT Short Term Goals - 08/19/17 1351      PT SHORT TERM GOAL #1   Title  Pt will independent complete HEP at least 4 days/wk for carryover between sessions    Time  2    Period  Weeks    Status  Achieved    Target Date  08/19/17        PT Long Term Goals - 09/30/17 1326      PT LONG TERM GOAL #1   Title  [name] 5xSTS will improve to equal to or less than 16 seconds without UE assist to demonstrate improved BLE strength    Baseline  24.98 seconds with UE assist: 08/19/17 20.71; 9/24: 16.28 sec with 1UE support    Time  4    Period  Weeks    Status  Partially Met      PT LONG TERM GOAL #2   Title  [name] 71mT time will improve to equal to or greater than 0.9 m/s to demonstrate improved safety ambulating in the community    Baseline  0.50 m/s;   08/19/17  .55 m/sec; 9/24 using SPC: 0.72 m/s    Time  4    Period  Weeks    Status  Partially Met      PT LONG TERM GOAL #3   Title  [name] Berg Balance Test score will improve to equal to or greater than 44/56 to demonstrate decreased likelihood of falling    Baseline  32/56 08/26/17 = 40 /56; 9/24: 40/56    Time  4    Period  Weeks    Status  On-going      PT LONG TERM GOAL #4   Title  Pt will be able to walk at least 200 ft without a rest break using SPC to demonstrate improved ambulatory endurance    Baseline  Pt limited to ambulating household distances due to fatigue, able to ambulate 200 feet with spc poor quality gait; 9/24: (same, pt veering to the R)    Time  4    Period  Weeks    Status  Partially Met      PT LONG TERM GOAL #5   Title  Pt will report no pain when sleeping or walking in L hip for improved QOL    Baseline  Pain every night if she lies on her L side    Time  2    Period  Weeks    Status  New            Plan - 09/30/17 1309    Clinical Impression Statement  Pt presents with continued L hip pain that intermittently affects her walking (pain when walking even short distances), and pain that is affecting her sleep (sleeping on her L side is painful).  Pt denies prior injury to L hip aside from falling on her L hip sometime over 6 months ago.  Quick assessment of L hip reveals significantly limited L hip IR AROM along with hypomobility of L hip joint. Pt with significant weakness in Bil hip abductors (L more weak than R).  Pt not TTP this session.  Suspect that her previous fall resulted in stiffness in L hip region and muscle guarding, leading to L hip restrictions, weakness, and gluteal tendon irritation.  Other differential diagnosis would include arthritis in L hip.  Instructed pt to begin icing L hip at site of pain each night for 20 minutes.  Pt performed strengthening exercises to Bil glutes this session.  Pt wil benefit from continued skilled PT interventions for improved strength, decreased pain, balance, and improved gait mechanics and endurance.     Rehab Potential  Good    PT Frequency   2x / week    PT Duration  4 weeks    PT Treatment/Interventions  ADLs/Self Care Home Management;Aquatic Therapy;Biofeedback;Cryotherapy;Electrical Stimulation;Iontophoresis 49m/ml Dexamethasone;Moist Heat;Traction;Ultrasound;DME Instruction;Contrast Bath;Gait training;Stair training;Functional mobility training;Therapeutic activities;Therapeutic exercise;Balance training;Neuromuscular re-education;Patient/family education;Orthotic Fit/Training;Manual techniques;Compression bandaging;Passive range of motion;Dry needling;Energy conservation;Taping    PT Next Visit Plan  evaluate L hip    PT Home Exercise Plan  sit<>stand with UE assist, tandem stance, romberg with horizontal head turns, marching in standing       Patient will benefit from skilled therapeutic intervention in order to improve the following deficits and impairments:  Abnormal gait, Decreased balance, Decreased endurance, Decreased knowledge of use of DME, Decreased mobility, Decreased range of motion, Decreased safety awareness, Decreased strength, Difficulty walking, Hypomobility, Increased fascial restricitons, Impaired perceived functional ability, Increased muscle spasms, Impaired flexibility, Impaired sensation, Improper body mechanics, Postural dysfunction, Pain, Decreased activity tolerance  Visit Diagnosis: Other abnormalities of gait and mobility  Muscle weakness (generalized)  Unsteadiness on feet     Problem List Patient Active Problem List   Diagnosis Date Noted  . Lumbar radiculopathy 06/24/2017  . Waddling gait  03/19/2017  . Gait abnormality 02/26/2017  . Advanced care planning/counseling discussion 08/04/2016  . S/P total knee replacement using cement, left 04/01/2016  . Primary osteoarthritis of right knee 03/19/2016  . Hyperlipidemia 11/19/2014  . Diabetes mellitus without complication ([place]) 61/53/7943  . Nonspecific abnormal results of thyroid function study 07/18/2014  . Hypertension 07/18/2014  .  Dysphagia 05/11/2013    Collie Siad PT, DPT 09/30/2017, 1:43 PM  [place] MAIN [facility] SERVICES [address] [place], [place], [address] Phone: [phone]   Fax:  [phone]  Name: Deanna Bird MRN: [id number] Date of Birth: June 12, 1928

## 2017-10-05 ENCOUNTER — Other Ambulatory Visit: Payer: PPO

## 2017-10-05 ENCOUNTER — Ambulatory Visit: Payer: PPO | Attending: Neurology | Admitting: Physical Therapy

## 2017-10-05 ENCOUNTER — Encounter: Payer: Self-pay | Admitting: Physical Therapy

## 2017-10-05 DIAGNOSIS — M6281 Muscle weakness (generalized): Secondary | ICD-10-CM | POA: Diagnosis not present

## 2017-10-05 DIAGNOSIS — R2689 Other abnormalities of gait and mobility: Secondary | ICD-10-CM | POA: Insufficient documentation

## 2017-10-05 DIAGNOSIS — R7989 Other specified abnormal findings of blood chemistry: Secondary | ICD-10-CM | POA: Diagnosis not present

## 2017-10-05 DIAGNOSIS — R2681 Unsteadiness on feet: Secondary | ICD-10-CM | POA: Insufficient documentation

## 2017-10-05 NOTE — Therapy (Signed)
[place] MAIN [facility] SERVICES [address] [place], [place], [address] Phone: [phone]   Fax:  [phone]  Physical Therapy Treatment  Patient Details  Name: Deanna Bird MRN: [id number] Date of Birth: 1928-07-13 Referring Provider (PT): Vladimir Crofts, MD    Encounter Date: 10/05/2017  PT End of Session - 10/05/17 1435    Visit Number  19    Number of Visits  33    Date for PT Re-Evaluation  10/26/17    Authorization Type  progress note 2/10    Authorization Time Period  Start of reporting period 9/24    PT Start Time  1433    PT Stop Time  1516    PT Time Calculation (min)  43 min    Equipment Utilized During Treatment  Gait belt    Activity Tolerance  Patient tolerated treatment well    Behavior During Therapy  [facility] for tasks assessed/performed       Past Medical History:  Diagnosis Date  . Achalasia   . Barrett's esophagus with dysplasia   . Diabetes mellitus without complication ([place])   . GERD (gastroesophageal reflux disease)   . Hyperlipidemia   . Hypertension   . MRSA infection    lung  . Thyroid disease     Past Surgical History:  Procedure Laterality Date  . ABDOMINAL HYSTERECTOMY    . achalasia    . APPENDECTOMY    . HIATAL HERNIA REPAIR    . KNEE ARTHROPLASTY Right 04/01/2016   Procedure: COMPUTER ASSISTED TOTAL KNEE ARTHROPLASTY;  Surgeon: Dereck Leep, MD;  Location: [facility] ORS;  Service: Orthopedics;  Laterality: Right;  . KNEE ARTHROPLASTY Left 11/30/2016   Procedure: COMPUTER ASSISTED TOTAL KNEE ARTHROPLASTY;  Surgeon: Dereck Leep, MD;  Location: [facility] ORS;  Service: Orthopedics;  Laterality: Left;  . PARATHYROIDECTOMY    . THYROID EXPLORATION    . TONSILLECTOMY      There were no vitals filed for this visit.  Subjective Assessment - 10/05/17 1439    Subjective  Pt reports she is doing well this date.  She has had some intermittent soreness in L hip since last session when the hip was assessed.   Pt denies pain today.     Pertinent History  Pt is a 84 y/o F who reports she has been diagnosed with spinal stenosis but that she is here for her balance.  Pt reports 3 falls in the past month with one of them occurring yesterday and a total of 4 falls in the past 6 months.  Pt says she is weak in her LLE, causing her to pull on the rail when going up steps, placing a lot of weight through her RUE making her R arm hurt.  Pt denies weakness in LUE. Pt ambulates with her hurry cane at all times. Pt believes her imbalance began in 2017.  Goals: "to be able to walk without feeling like I am going to stumble", working in the yard without losing her balance. Pt reports pain in L buttocks with sit>stand and standing, no pain in sitting. Pain for several months on and off, currently on for 3-4 days. Says it started after sitting on pillow in car.  Pt says she is constipated and is taking dulcolax for this, pt denies night sweats and saddle anesthesia. Pt reports she is tired all of the time and feels "washed out" but denies suicidal thoughts, feeling down, or feeling depressed.  Told pt this  therapist would like to notify pt's MD of pt's feeling of "washed out" but pt politely requests for pt to let MD know during her appointment on 7/12. Pt has lost 50 lbs over the course of 2 years (Dr. Manuella Ghazi aware). Pt contributes this to pneumonia and being on antibiotics. EMG on 3/29 with Impression: "This is an abnormal electrodiagnostic study consistent with a 1) generalized polyneuropathy. 2) superimposed chronic L5 radiculopathy."     Currently in Pain?  No/denies        TREATMENT   Supine isometric clamshells with band around knees x10 second holds, x10   Bridges with cues for glute activation 2x15   Sit<>stand from slightly elevated mat table without UE assist 2x10. Tried from regular height chair again (only ~2 inches lower than mat table) and pt unable to achieve this without UE assist. Pt unable to achieve  adequate anterior lean.   Alternating toe taps x15 each LE with intermittent UE support   Tandem balance 2x45 seconds with intermittent UE support   Forward and backward stepping over hurdle x10 each direction with light 1UE support   Side stepping over hurdle x10 each direction with light 1UE support   Standing on LLE and tapping moving balance stone with RLE with intermittent UE support, repeated standing on RLE without UE support   Balancing on  foam roll with flat side up x1 minute and intermittent UE support   Feet together on airex pad x1 minute with increased postural sway                         PT Education - 10/05/17 1434    Education Details  Exercise technique    Person(s) Educated  Patient    Methods  Explanation;Demonstration;Verbal cues    Comprehension  Verbalized understanding;Returned demonstration;Verbal cues required;Need further instruction       PT Short Term Goals - 08/19/17 1351      PT SHORT TERM GOAL #1   Title  Pt will independent complete HEP at least 4 days/wk for carryover between sessions    Time  2    Period  Weeks    Status  Achieved    Target Date  08/19/17        PT Long Term Goals - 09/30/17 1326      PT LONG TERM GOAL #1   Title  [name] 5xSTS will improve to equal to or less than 16 seconds without UE assist to demonstrate improved BLE strength    Baseline  24.98 seconds with UE assist: 08/19/17 20.71; 9/24: 16.28 sec with 1UE support    Time  4    Period  Weeks    Status  Partially Met      PT LONG TERM GOAL #2   Title  [name] 68mT time will improve to equal to or greater than 0.9 m/s to demonstrate improved safety ambulating in the community    Baseline  0.50 m/s;   08/19/17  .55 m/sec; 9/24 using SPC: 0.72 m/s    Time  4    Period  Weeks    Status  Partially Met      PT LONG TERM GOAL #3   Title  [name] Berg Balance Test score will improve to equal to or greater than 44/56 to demonstrate decreased likelihood  of falling    Baseline  32/56 08/26/17 = 40 /56; 9/24: 40/56    Time  4    Period  Weeks    Status  On-going      PT LONG TERM GOAL #4   Title  Pt will be able to walk at least 200 ft without a rest break using SPC to demonstrate improved ambulatory endurance    Baseline  Pt limited to ambulating household distances due to fatigue, able to ambulate 200 feet with spc poor quality gait; 9/24: (same, pt veering to the R)    Time  4    Period  Weeks    Status  Partially Met      PT LONG TERM GOAL #5   Title  Pt will report no pain when sleeping or walking in L hip for improved QOL    Baseline  Pain every night if she lies on her L side    Time  2    Period  Weeks    Status  New            Plan - 10/05/17 1441    Clinical Impression Statement  Pt reports feeling sore in L hip since last session when L hip was assessed, thus deferred L hip joint mobs this session and focused on strengthening and balance.  Encouraged pt to continue icing L hip in an effort to decrease irritation of this region when sleeping on her L side. Pt remains unable to stand from regular height chair without UE assist but was able to stand from elevated mat table (~2 inches higher) without UE assist.  Continued to work on SLS stepping activities and narrow BOS activities as this remains difficult for the pt. Pt will benefit from continued skilled PT interventions for improved balance and strength.      Rehab Potential  Good    PT Frequency  2x / week    PT Duration  4 weeks    PT Treatment/Interventions  ADLs/Self Care Home Management;Aquatic Therapy;Biofeedback;Cryotherapy;Electrical Stimulation;Iontophoresis 54m/ml Dexamethasone;Moist Heat;Traction;Ultrasound;DME Instruction;Contrast Bath;Gait training;Stair training;Functional mobility training;Therapeutic activities;Therapeutic exercise;Balance training;Neuromuscular re-education;Patient/family education;Orthotic Fit/Training;Manual techniques;Compression  bandaging;Passive range of motion;Dry needling;Energy conservation;Taping    PT Next Visit Plan  evaluate L hip    PT Home Exercise Plan  sit<>stand with UE assist, tandem stance, romberg with horizontal head turns, marching in standing       Patient will benefit from skilled therapeutic intervention in order to improve the following deficits and impairments:  Abnormal gait, Decreased balance, Decreased endurance, Decreased knowledge of use of DME, Decreased mobility, Decreased range of motion, Decreased safety awareness, Decreased strength, Difficulty walking, Hypomobility, Increased fascial restricitons, Impaired perceived functional ability, Increased muscle spasms, Impaired flexibility, Impaired sensation, Improper body mechanics, Postural dysfunction, Pain, Decreased activity tolerance  Visit Diagnosis: Other abnormalities of gait and mobility  Muscle weakness (generalized)  Unsteadiness on feet     Problem List Patient Active Problem List   Diagnosis Date Noted  . Lumbar radiculopathy 06/24/2017  . Waddling gait 03/19/2017  . Gait abnormality 02/26/2017  . Advanced care planning/counseling discussion 08/04/2016  . S/P total knee replacement using cement, left 04/01/2016  . Primary osteoarthritis of right knee 03/19/2016  . Hyperlipidemia 11/19/2014  . Diabetes mellitus without complication ([place] 001/65/[id number] . Nonspecific abnormal results of thyroid function study 07/18/2014  . Hypertension 07/18/2014  . Dysphagia 05/11/2013    ACollie SiadPT, DPT 10/05/2017, 3:19 PM  [facility] [address] [address]Phone: [phone]  Fax:  [phone] Name: Deanna BARDONMRN: [id number] of Birth: 9August 01, 1930

## 2017-10-06 ENCOUNTER — Telehealth: Payer: Self-pay | Admitting: Family Medicine

## 2017-10-06 DIAGNOSIS — D72829 Elevated white blood cell count, unspecified: Secondary | ICD-10-CM | POA: Insufficient documentation

## 2017-10-06 LAB — CBC WITH DIFFERENTIAL/PLATELET
BASOS ABS: 0 10*3/uL (ref 0.0–0.2)
BASOS: 0 %
EOS (ABSOLUTE): 0 10*3/uL (ref 0.0–0.4)
Eos: 0 %
HEMATOCRIT: 32.1 % — AB (ref 34.0–46.6)
HEMOGLOBIN: 10.2 g/dL — AB (ref 11.1–15.9)
IMMATURE GRANS (ABS): 0 10*3/uL (ref 0.0–0.1)
Immature Granulocytes: 0 %
LYMPHS ABS: 1.5 10*3/uL (ref 0.7–3.1)
LYMPHS: 11 %
MCH: 29.1 pg (ref 26.6–33.0)
MCHC: 31.8 g/dL (ref 31.5–35.7)
MCV: 92 fL (ref 79–97)
MONOCYTES: 4 %
Monocytes Absolute: 0.5 10*3/uL (ref 0.1–0.9)
NEUTROS ABS: 12.2 10*3/uL — AB (ref 1.4–7.0)
Neutrophils: 85 %
Platelets: 333 10*3/uL (ref 150–450)
RBC: 3.51 x10E6/uL — ABNORMAL LOW (ref 3.77–5.28)
RDW: 13.1 % (ref 12.3–15.4)
WBC: 14.3 10*3/uL — ABNORMAL HIGH (ref 3.4–10.8)

## 2017-10-06 LAB — TSH: TSH: 3.68 u[IU]/mL (ref 0.450–4.500)

## 2017-10-06 NOTE — Telephone Encounter (Signed)
-----   Message from Georgina Peer, Herman sent at 10/06/2017  4:49 PM EDT ----- Phone call.

## 2017-10-06 NOTE — Assessment & Plan Note (Signed)
Apt hematology

## 2017-10-06 NOTE — Telephone Encounter (Signed)
Phone call Discussed with patient thyroid normalized but hemoglobin dropping and WBC increasing.  Patient just does not feel well otherwise no sign of infection or issues will refer to hematology to further evaluate.

## 2017-10-07 ENCOUNTER — Ambulatory Visit: Payer: PPO | Admitting: Physical Therapy

## 2017-10-08 ENCOUNTER — Telehealth: Payer: Self-pay

## 2017-10-08 ENCOUNTER — Telehealth: Payer: Self-pay | Admitting: Family Medicine

## 2017-10-08 DIAGNOSIS — M25552 Pain in left hip: Secondary | ICD-10-CM

## 2017-10-08 NOTE — Telephone Encounter (Signed)
Copied from [place] [id number]. Topic: Appointment Scheduling - Prior Auth Required for Appointment >> Oct 08, 2017  1:20 PM Deanna Bird wrote: Patient is requesting to have an xray of her left hip. She said that Dr Jeananne Rama knows she is going through physical therapy with her hip. She said she will pay out of pocket if insurance does not cover this. Please contact patient, she wants this for her own satisfaction. >> Oct 08, 2017  4:16 PM Don Perking M wrote: Please advise

## 2017-10-08 NOTE — Telephone Encounter (Signed)
Order is in. She can get x-ray. If there is an issue with it, she will need to follow up.

## 2017-10-08 NOTE — Telephone Encounter (Signed)
Spoke with the patient to inforrn her of a new patient appointment time and date 10/8 @ 1:30 pm. The patient was agreeable and understanding of appointment time and date.

## 2017-10-11 ENCOUNTER — Ambulatory Visit: Payer: PPO

## 2017-10-11 ENCOUNTER — Telehealth: Payer: Self-pay | Admitting: Family Medicine

## 2017-10-11 DIAGNOSIS — R2681 Unsteadiness on feet: Secondary | ICD-10-CM

## 2017-10-11 DIAGNOSIS — M6281 Muscle weakness (generalized): Secondary | ICD-10-CM

## 2017-10-11 DIAGNOSIS — M25559 Pain in unspecified hip: Secondary | ICD-10-CM

## 2017-10-11 DIAGNOSIS — R2689 Other abnormalities of gait and mobility: Secondary | ICD-10-CM

## 2017-10-11 NOTE — Telephone Encounter (Signed)
Copied from [place] [id number]. Topic: Appointment Scheduling - Prior Auth Required for Appointment >> Oct 08, 2017  1:20 PM Ahmed Prima L wrote: Patient is requesting to have an xray of her left hip. She said that Dr Jeananne Rama knows she is going through physical therapy with her hip. She said she will pay out of pocket if insurance does not cover this. Please contact patient, she wants this for her own satisfaction. >> Oct 08, 2017  4:16 PM Don Perking M wrote: Please advise

## 2017-10-11 NOTE — Telephone Encounter (Signed)
Patient notified

## 2017-10-11 NOTE — Telephone Encounter (Signed)
For patient's continued hip pain will refer to orthopedics so the appropriate x-ray can be ordered.  Please let the patient know

## 2017-10-11 NOTE — Therapy (Signed)
[place] MAIN [facility] SERVICES [address] [place], [place], [address] Phone: [phone]   Fax:  [phone]  Physical Therapy Treatment  Patient Details  Name: Deanna Bird MRN: [id number] Date of Birth: [birth year] Referring Provider (PT): Vladimir Crofts, MD    Encounter Date: 10/11/2017  PT End of Session - 10/11/17 1256    Visit Number  20    Number of Visits  33    Date for PT Re-Evaluation  10/26/17    Authorization Type  progress note 3/10    Authorization Time Period  Start of reporting period 9/24    PT Start Time  1300    PT Stop Time  1345    PT Time Calculation (min)  45 min    Equipment Utilized During Treatment  Gait belt    Activity Tolerance  Patient tolerated treatment well    Behavior During Therapy  [facility] for tasks assessed/performed       Past Medical History:  Diagnosis Date  . Achalasia   . Barrett's esophagus with dysplasia   . Diabetes mellitus without complication ([place])   . GERD (gastroesophageal reflux disease)   . Hyperlipidemia   . Hypertension   . MRSA infection    lung  . Thyroid disease     Past Surgical History:  Procedure Laterality Date  . ABDOMINAL HYSTERECTOMY    . achalasia    . APPENDECTOMY    . HIATAL HERNIA REPAIR    . KNEE ARTHROPLASTY Right 04/01/2016   Procedure: COMPUTER ASSISTED TOTAL KNEE ARTHROPLASTY;  Surgeon: Dereck Leep, MD;  Location: [facility] ORS;  Service: Orthopedics;  Laterality: Right;  . KNEE ARTHROPLASTY Left 11/30/2016   Procedure: COMPUTER ASSISTED TOTAL KNEE ARTHROPLASTY;  Surgeon: Dereck Leep, MD;  Location: [facility] ORS;  Service: Orthopedics;  Laterality: Left;  . PARATHYROIDECTOMY    . THYROID EXPLORATION    . TONSILLECTOMY      There were no vitals filed for this visit.  Subjective Assessment - 10/11/17 1256    Subjective  Pt states that she is doing alright today. She denies any hip pain currently but she does have some soreness in the morning when she  first gets up. She reports chronic fatigue and increased sleeping. She contacted her MD who she states first ordered plain film radiographs of her hip but now is referring her to orthopedics for further evaluation.     Pertinent History  Pt is a 84 y/o F who reports she has been diagnosed with spinal stenosis but that she is here for her balance.  Pt reports 3 falls in the past month with one of them occurring yesterday and a total of 4 falls in the past 6 months.  Pt says she is weak in her LLE, causing her to pull on the rail when going up steps, placing a lot of weight through her RUE making her R arm hurt.  Pt denies weakness in LUE. Pt ambulates with her hurry cane at all times. Pt believes her imbalance began in 2017.  Goals: "to be able to walk without feeling like I am going to stumble", working in the yard without losing her balance. Pt reports pain in L buttocks with sit>stand and standing, no pain in sitting. Pain for several months on and off, currently on for 3-4 days. Says it started after sitting on pillow in car.  Pt says she is constipated and is taking dulcolax for this, pt denies night sweats  and saddle anesthesia. Pt reports she is tired all of the time and feels "washed out" but denies suicidal thoughts, feeling down, or feeling depressed.  Told pt this therapist would like to notify pt's MD of pt's feeling of "washed out" but pt politely requests for pt to let MD know during her appointment on 7/12. Pt has lost 50 lbs over the course of 2 years (Dr. Manuella Ghazi aware). Pt contributes this to pneumonia and being on antibiotics. EMG on 3/29 with Impression: "This is an abnormal electrodiagnostic study consistent with a 1) generalized polyneuropathy. 2) superimposed chronic L5 radiculopathy."     How long can you stand comfortably?  10 minutes without UE support    How long can you walk comfortably?  household distances due to fatigue    Diagnostic tests  MRI on 03/29/17: "L4-5 severe facet  arthropathy with anterolisthesis. Spinal stenosis is advanced and there is left L4 foraminal impingement. Elsewhere mild for age degenerative changes as described above. Pt had EMG done on 3/29: "EMG & NCV Findings: Evaluation of the Left peroneal motor nerve showed no response (Ankle) and no response (Ankle). The Left tibial motor nerve showed prolonged distal onset latency (7.0 ms) and reduced amplitude (1.0 mV). The Left ulnar motor nerve showed prolonged distal onset latency (5.0 ms). The Left radial sensory nerve showed prolonged distal peak latency (2.6 ms). The Left superficial peroneal sensory nerve showed no response (14 cm). The Left sural sensory nerve showed no response (Calf). The Left ulnar sensory nerve showed prolonged distal peak latency (3.8 ms) and decreased conduction velocity (Wrist-5th Digit, 32 m/s)".     Currently in Pain?  No/denies           TREATMENT   Ther-ex  NuStep L0 x 4 minutes for warm-up during history; Supine isometric clamshells with band around knees 5s holds 2 x 10; Supine isometric adductor ball squeeze 5s hold 2 x 10; Bridges with cues for glute activation 2 x 15;  Sit<>stand from slightly elevated mat table without UE assist 2x10, trial and error to find the right height that is challenging but where pt could be successful; Strength testing of bilateral LE abduction demonstrates 2/5 bilateral hip abduction strength with concordant pain posterior to greater trochanter on L with resistance in supine. No pain on right side.    Neuromuscular Re-education  Alternating toe taps x15 each LE with intermittent UE support  Tandem balance 2 x 45 seconds with intermittent UE support alternating forward LE;  Forward and backward stepping over hurdle x10 each direction with light single UE support    Pt educated throughout session about proper posture and technique with exercises. Improved exercise technique, movement at target joints, use of target muscles after  min to mod verbal, visual, tactile cues.    Pt denies any L hip pain during session today. She struggles with single leg stance on LLE with notable hip drop when in L single leg stance. Strength testing of bilateral LE abduction demonstrates 2/5 bilateral hip abduction strength with concordant pain posterior to greater trochanter on L with resistance in supine. No pain on right side. It is likely that pt has at least a partial tear of L hip abductors. This will not be visualized with plain film radiographs. Pt advised to sleep on R side with pillow between knees to see if this improves her L hip waking pain. Pt will benefit from continued skilled PT interventions for improved balance and strength.  PT Short Term Goals - 08/19/17 1351      PT SHORT TERM GOAL #1   Title  Pt will independent complete HEP at least 4 days/wk for carryover between sessions    Time  2    Period  Weeks    Status  Achieved    Target Date  08/19/17        PT Long Term Goals - 09/30/17 1326      PT LONG TERM GOAL #1   Title  [name] 5xSTS will improve to equal to or less than 16 seconds without UE assist to demonstrate improved BLE strength    Baseline  24.98 seconds with UE assist: 08/19/17 20.71; 9/24: 16.28 sec with 1UE support    Time  4    Period  Weeks    Status  Partially Met      PT LONG TERM GOAL #2   Title  [name] 75mT time will improve to equal to or greater than 0.9 m/s to demonstrate improved safety ambulating in the community    Baseline  0.50 m/s;   08/19/17  .55 m/sec; 9/24 using SPC: 0.72 m/s    Time  4    Period  Weeks    Status  Partially Met      PT LONG TERM GOAL #3   Title  [name] Berg Balance Test score will improve to equal to or greater than 44/56 to demonstrate decreased likelihood of falling    Baseline  32/56 08/26/17 = 40 /56; 9/24: 40/56    Time  4    Period  Weeks    Status  On-going      PT LONG TERM GOAL #4   Title  Pt will be able to walk at  least 200 ft without a rest break using SPC to demonstrate improved ambulatory endurance    Baseline  Pt limited to ambulating household distances due to fatigue, able to ambulate 200 feet with spc poor quality gait; 9/24: (same, pt veering to the R)    Time  4    Period  Weeks    Status  Partially Met      PT LONG TERM GOAL #5   Title  Pt will report no pain when sleeping or walking in L hip for improved QOL    Baseline  Pain every night if she lies on her L side    Time  2    Period  Weeks    Status  New            Plan - 10/11/17 1257    Clinical Impression Statement  Pt denies any L hip pain during session today. She struggles with single leg stance on LLE with notable hip drop when in L single leg stance. Strength testing of bilateral LE abduction demonstrates 2/5 bilateral hip abduction strength with concordant pain posterior to greater trochanter on L with resistance in supine. No pain on right side. It is likely that pt has at least a partial tear of L hip abductors. This will not be visualized with plain film radiographs. Pt advised to sleep on R side with pillow between knees to see if this improves her L hip waking pain. Pt will benefit from continued skilled PT interventions for improved balance and strength.     Rehab Potential  Good    PT Frequency  2x / week    PT Duration  4 weeks    PT Treatment/Interventions  ADLs/Self Care Home Management;Aquatic  Therapy;Biofeedback;Cryotherapy;Electrical Stimulation;Iontophoresis 25m/ml Dexamethasone;Moist Heat;Traction;Ultrasound;DME Instruction;Contrast Bath;Gait training;Stair training;Functional mobility training;Therapeutic activities;Therapeutic exercise;Balance training;Neuromuscular re-education;Patient/family education;Orthotic Fit/Training;Manual techniques;Compression bandaging;Passive range of motion;Dry needling;Energy conservation;Taping    PT Next Visit Plan  progress bilateral hip strengthening and balance    PT Home  Exercise Plan  sit<>stand with UE assist, tandem stance, romberg with horizontal head turns, marching in standing       Patient will benefit from skilled therapeutic intervention in order to improve the following deficits and impairments:  Abnormal gait, Decreased balance, Decreased endurance, Decreased knowledge of use of DME, Decreased mobility, Decreased range of motion, Decreased safety awareness, Decreased strength, Difficulty walking, Hypomobility, Increased fascial restricitons, Impaired perceived functional ability, Increased muscle spasms, Impaired flexibility, Impaired sensation, Improper body mechanics, Postural dysfunction, Pain, Decreased activity tolerance  Visit Diagnosis: Other abnormalities of gait and mobility  Unsteadiness on feet  Muscle weakness (generalized)     Problem List Patient Active Problem List   Diagnosis Date Noted  . Elevated WBC count 10/06/2017  . Lumbar radiculopathy 06/24/2017  . Waddling gait 03/19/2017  . Gait abnormality 02/26/2017  . Advanced care planning/counseling discussion 08/04/2016  . S/P total knee replacement using cement, left 04/01/2016  . Primary osteoarthritis of right knee 03/19/2016  . Hyperlipidemia 11/19/2014  . Diabetes mellitus without complication ([place] 014/64/3142 . Nonspecific abnormal results of thyroid function study 07/18/2014  . Hypertension 07/18/2014  . Dysphagia 05/11/2013   JPhillips GroutPT, DPT, GCS  Huprich,Jason 10/11/2017, 2:07 PM  [place]MAIN [facility] HospitalSERVICES [address] [address]Phone: [phone]  Fax:  [phone] Name: LROSABEL SERMENOMRN: [id number] of Birth: 904-Apr-1930

## 2017-10-12 ENCOUNTER — Other Ambulatory Visit: Payer: Self-pay

## 2017-10-12 ENCOUNTER — Encounter: Payer: Self-pay | Admitting: Oncology

## 2017-10-12 ENCOUNTER — Inpatient Hospital Stay: Payer: PPO

## 2017-10-12 ENCOUNTER — Inpatient Hospital Stay: Payer: PPO | Attending: Oncology | Admitting: Oncology

## 2017-10-12 VITALS — BP 148/70 | HR 67 | Temp 97.2°F | Resp 18 | Ht 63.5 in | Wt 124.7 lb

## 2017-10-12 DIAGNOSIS — D649 Anemia, unspecified: Secondary | ICD-10-CM

## 2017-10-12 DIAGNOSIS — D72829 Elevated white blood cell count, unspecified: Secondary | ICD-10-CM | POA: Diagnosis not present

## 2017-10-12 DIAGNOSIS — E119 Type 2 diabetes mellitus without complications: Secondary | ICD-10-CM | POA: Diagnosis not present

## 2017-10-12 DIAGNOSIS — E785 Hyperlipidemia, unspecified: Secondary | ICD-10-CM

## 2017-10-12 DIAGNOSIS — I10 Essential (primary) hypertension: Secondary | ICD-10-CM | POA: Diagnosis not present

## 2017-10-12 DIAGNOSIS — D729 Disorder of white blood cells, unspecified: Secondary | ICD-10-CM

## 2017-10-12 LAB — CBC WITH DIFFERENTIAL/PLATELET
BASOS PCT: 0 %
Basophils Absolute: 0 10*3/uL (ref 0.0–0.1)
Eosinophils Absolute: 0 10*3/uL (ref 0.0–0.5)
Eosinophils Relative: 0 %
HCT: 32.1 % — ABNORMAL LOW (ref 36.0–46.0)
Hemoglobin: 10 g/dL — ABNORMAL LOW (ref 12.0–15.0)
Lymphocytes Relative: 8 %
Lymphs Abs: 0.9 10*3/uL (ref 0.7–4.0)
MCH: 29.2 pg (ref 26.0–34.0)
MCHC: 31.2 g/dL (ref 30.0–36.0)
MCV: 93.9 fL (ref 80.0–100.0)
MONO ABS: 0.3 10*3/uL (ref 0.1–1.0)
MONOS PCT: 3 %
Neutro Abs: 10.7 10*3/uL — ABNORMAL HIGH (ref 1.7–7.7)
Neutrophils Relative %: 88 %
Platelets: 276 10*3/uL (ref 150–400)
RBC: 3.42 MIL/uL — ABNORMAL LOW (ref 3.87–5.11)
RDW: 14 % (ref 11.5–15.5)
WBC: 16.2 10*3/uL — ABNORMAL HIGH (ref 4.0–10.5)
nRBC: 0 % (ref 0.0–0.2)

## 2017-10-12 LAB — TECHNOLOGIST SMEAR REVIEW

## 2017-10-12 LAB — IRON AND TIBC
IRON: 17 ug/dL — AB (ref 28–170)
Saturation Ratios: 7 % — ABNORMAL LOW (ref 10.4–31.8)
TIBC: 256 ug/dL (ref 250–450)
UIBC: 239 ug/dL

## 2017-10-12 LAB — RETICULOCYTES
Immature Retic Fract: 12.2 % (ref 2.3–15.9)
RBC.: 3.42 MIL/uL — AB (ref 3.87–5.11)
Retic Count, Absolute: 67 10*3/uL (ref 19.0–186.0)
Retic Ct Pct: 2 % (ref 0.4–3.1)

## 2017-10-12 LAB — FOLATE: FOLATE: 17.2 ng/mL (ref >5.9)

## 2017-10-12 LAB — LACTATE DEHYDROGENASE: LDH: 110 U/L (ref 98–192)

## 2017-10-12 LAB — FERRITIN: Ferritin: 161 ng/mL (ref 11–307)

## 2017-10-12 NOTE — Progress Notes (Signed)
Patient here for initial visit.  She states feeling tired all the time.

## 2017-10-13 ENCOUNTER — Ambulatory Visit: Payer: PPO

## 2017-10-13 LAB — HAPTOGLOBIN: HAPTOGLOBIN: 237 mg/dL — AB (ref 34–200)

## 2017-10-13 LAB — MULTIPLE MYELOMA PANEL, SERUM
ALBUMIN/GLOB SERPL: 0.8 (ref 0.7–1.7)
Albumin SerPl Elph-Mcnc: 2.6 g/dL — ABNORMAL LOW (ref 2.9–4.4)
Alpha 1: 0.4 g/dL (ref 0.0–0.4)
Alpha2 Glob SerPl Elph-Mcnc: 0.7 g/dL (ref 0.4–1.0)
B-GLOBULIN SERPL ELPH-MCNC: 1 g/dL (ref 0.7–1.3)
Gamma Glob SerPl Elph-Mcnc: 1.2 g/dL (ref 0.4–1.8)
Globulin, Total: 3.3 g/dL (ref 2.2–3.9)
IGA: 320 mg/dL (ref 64–422)
IgG (Immunoglobin G), Serum: 1328 mg/dL (ref 700–1600)
IgM (Immunoglobulin M), Srm: 37 mg/dL (ref 26–217)
TOTAL PROTEIN ELP: 5.9 g/dL — AB (ref 6.0–8.5)

## 2017-10-13 LAB — VITAMIN B12: VITAMIN B 12: 1236 pg/mL — AB (ref 180–914)

## 2017-10-13 LAB — KAPPA/LAMBDA LIGHT CHAINS
KAPPA FREE LGHT CHN: 73.4 mg/L — AB (ref 3.3–19.4)
Kappa, lambda light chain ratio: 1.82 — ABNORMAL HIGH (ref 0.26–1.65)
Lambda free light chains: 40.4 mg/L — ABNORMAL HIGH (ref 5.7–26.3)

## 2017-10-14 ENCOUNTER — Telehealth: Payer: Self-pay | Admitting: Family Medicine

## 2017-10-14 NOTE — Telephone Encounter (Signed)
Copied from [place] [id number]. Topic: General - Other >> Oct 14, 2017  4:42 PM Yvette Rack wrote: Reason for CRM: Pt states she was suppose to receive a call from Dr. Jeananne Rama nurse to schedule an appt with the Orthopedic surgeon but she has not heard from anyone. Pt request call back.  Was not able to get a hold of pt therefore called her daughter [name] and asked her if she would give pt [facility] phone number for her to call them. Referral has already been submitted but they are taking longer the usual to schedule appt for pt.

## 2017-10-15 ENCOUNTER — Encounter: Payer: Self-pay | Admitting: Oncology

## 2017-10-15 NOTE — Progress Notes (Signed)
Hematology/Oncology Consult note [facility] [phone] [phone]  Patient Care Team: Guadalupe Maple, MD as PCP - General (Family Medicine)   Name of the patient: Deanna Bird  [id number]  March 28, 1928    Reason for referral- leucocytosis   Referring physician- Dr. Jeananne Rama  Date of visit: 10/15/17   History of presenting illness-patient is a 84 year old Caucasian female who has been referred to [place] for leukocytosis.  Patient has past medical history significant for spinal stenosis and ongoing radiculopathy.  She also has hypertension diabetes and hyperlipidemia.  Patient was noted to have a normal white count up until November 2018.  Her white count was 10.9 in August 2019 and further increased to 14.3 on 10/05/2017.  Differential mainly shows neutrophilia.  Platelet counts are normal.  Her hemoglobin has been around 11 up until last year and then presently down to 10.2.  She reports some trouble with her balance.  Her appetite is good and she denies any unintentional weight loss.  Denies any recurrent infections or hospitalizations.  ECOG PS- 2  Pain scale- 0   Review of systems- Review of Systems  Constitutional: Positive for malaise/fatigue. Negative for chills, fever and weight loss.  HENT: Negative for congestion, ear discharge and nosebleeds.   Eyes: Negative for blurred vision.  Respiratory: Negative for cough, hemoptysis, sputum production, shortness of breath and wheezing.   Cardiovascular: Negative for chest pain, palpitations, orthopnea and claudication.  Gastrointestinal: Negative for abdominal pain, blood in stool, constipation, diarrhea, heartburn, melena, nausea and vomiting.  Genitourinary: Negative for dysuria, flank pain, frequency, hematuria and urgency.  Musculoskeletal: Positive for back pain. Negative for joint pain and myalgias.  Skin: Negative for rash.  Neurological: Negative for dizziness, tingling, focal  weakness, seizures, weakness and headaches.  Endo/Heme/Allergies: Does not bruise/bleed easily.  Psychiatric/Behavioral: Negative for depression and suicidal ideas. The patient does not have insomnia.     Allergies  Allergen Reactions  . Procaine Palpitations and Other (See Comments)    Rapid heartbeat    Patient Active Problem List   Diagnosis Date Noted  . Elevated WBC count 10/06/2017  . Lumbar radiculopathy 06/24/2017  . Waddling gait 03/19/2017  . Gait abnormality 02/26/2017  . Advanced care planning/counseling discussion 08/04/2016  . S/P total knee replacement using cement, left 04/01/2016  . Primary osteoarthritis of right knee 03/19/2016  . Hyperlipidemia 11/19/2014  . Diabetes mellitus without complication ([place]) 29/93/7169  . Nonspecific abnormal results of thyroid function study 07/18/2014  . Hypertension 07/18/2014  . Dysphagia 05/11/2013     Past Medical History:  Diagnosis Date  . Achalasia   . Barrett's esophagus with dysplasia   . Diabetes mellitus without complication ([place])   . GERD (gastroesophageal reflux disease)   . Hyperlipidemia   . Hypertension   . MRSA infection    lung  . Thyroid disease      Past Surgical History:  Procedure Laterality Date  . ABDOMINAL HYSTERECTOMY     complete  . achalasia    . APPENDECTOMY    . HIATAL HERNIA REPAIR    . KNEE ARTHROPLASTY Right 04/01/2016   Procedure: COMPUTER ASSISTED TOTAL KNEE ARTHROPLASTY;  Surgeon: Dereck Leep, MD;  Location: [facility] ORS;  Service: Orthopedics;  Laterality: Right;  . KNEE ARTHROPLASTY Left 11/30/2016   Procedure: COMPUTER ASSISTED TOTAL KNEE ARTHROPLASTY;  Surgeon: Dereck Leep, MD;  Location: [facility] ORS;  Service: Orthopedics;  Laterality: Left;  . PARATHYROIDECTOMY    . THYROID EXPLORATION    .  TONSILLECTOMY      Social History   Socioeconomic History  . Marital status: Married    Spouse name: Not on file  . Number of children: Not on file  . Years of education: 69    . Highest education level: High school graduate  Occupational History  . Not on file  Social Needs  . Financial resource strain: Not hard at all  . Food insecurity:    Worry: Never true    Inability: Never true  . Transportation needs:    Medical: No    Non-medical: No  Tobacco Use  . Smoking status: Former Smoker    Packs/day: 0.25    Years: 1.00    Pack years: 0.25    Last attempt to quit: 10/13/1962    Years since quitting: 55.0  . Smokeless tobacco: Never Used  . Tobacco comment: smoked one pack a week for 1 year - 55 years ago  Substance and Sexual Activity  . Alcohol use: No  . Drug use: No  . Sexual activity: Not on file  Lifestyle  . Physical activity:    Days per week: 0 days    Minutes per session: 0 min  . Stress: Not at all  Relationships  . Social connections:    Talks on phone: More than three times a week    Gets together: More than three times a week    Attends religious service: More than 4 times per year    Active member of club or organization: No    Attends meetings of clubs or organizations: Never    Relationship status: Married  . Intimate partner violence:    Fear of current or ex partner: No    Emotionally abused: No    Physically abused: No    Forced sexual activity: No  Other Topics Concern  . Not on file  Social History Narrative  . Not on file     Family History  Problem Relation Age of Onset  . Heart disease Mother   . Diabetes Father   . Lung cancer Sister   . Diabetes Sister   . Stomach cancer Brother   . CAD Brother      Current Outpatient Medications:  .  Alpha-Lipoic Acid 200 MG TABS, Take 200 mg by mouth 2 (two) times daily. , Disp: , Rfl:  .  Cholecalciferol (VITAMIN D-3) 5000 units TABS, Take 5,000 Units daily with supper by mouth. , Disp: , Rfl:  .  docusate sodium (COLACE) 100 MG capsule, Take 200 mg by mouth at bedtime., Disp: , Rfl:  .  Fenofibric Acid 105 MG TABS, Take 1 tablet (105 mg total) by mouth daily.,  Disp: 30 tablet, Rfl: 12 .  glimepiride (AMARYL) 1 MG tablet, Take 1 tablet (1 mg total) by mouth daily with breakfast., Disp: 30 tablet, Rfl: 12 .  glucose blood (ONE TOUCH ULTRA TEST) test strip, 2 (two) times daily., Disp: , Rfl:  .  losartan-hydrochlorothiazide (HYZAAR) 100-25 MG tablet, Take 1 tablet by mouth daily., Disp: 30 tablet, Rfl: 12 .  Melatonin 1 MG CAPS, Take 1 capsule at bedtime as needed by mouth., Disp: , Rfl:  .  metoprolol succinate (TOPROL-XL) 100 MG 24 hr tablet, Take 1 tablet (100 mg total) by mouth daily. Take with or immediately following a meal., Disp: 30 tablet, Rfl: 12 .  Multiple Vitamin (MULTIVITAMINS PO), Take 1 packet by mouth 2 (two) times daily. Forward Gold Daily Regimen (multivitamin/bone&balance/omega 3) By Dr. Lanell Matar, Disp: ,  Rfl:  .  Probiotic Product (PROBIOTIC PO), Take 1 capsule by mouth daily before breakfast., Disp: , Rfl:  .  PROCTO-MED HC 2.5 % rectal cream, Place 1 application rectally 2 (two) times daily., Disp: 30 g, Rfl: 0   Physical exam:  Vitals:   10/12/17 1349  BP: (!) 148/70  Pulse: 67  Resp: 18  Temp: (!) 97.2 F (36.2 C)  TempSrc: Tympanic  Weight: 124 lb 11.2 oz (56.6 kg)  Height: 5' 3.5" (1.613 m)   Physical Exam  Constitutional: She is oriented to person, place, and time.  Thin elderly female in no acute distress.  HENT:  Head: Normocephalic and atraumatic.  Eyes: Pupils are equal, round, and reactive to light. EOM are normal.  Neck: Normal range of motion.  Cardiovascular: Normal rate, regular rhythm and normal heart sounds.  Pulmonary/Chest: Effort normal and breath sounds normal.  Abdominal: Soft. Bowel sounds are normal.  No palpable hepatosplenomegaly  Lymphadenopathy:  No palpable cervical, supraclavicular, axillary or inguinal adenopathy   Neurological: She is alert and oriented to person, place, and time.  Skin: Skin is warm and dry.       CMP Latest Ref Rng & Units 08/10/2017  Glucose 65 - 99 mg/dL 96    BUN 8 - 27 mg/dL 23  Creatinine 0.57 - 1.00 mg/dL 1.17(H)  Sodium 134 - 144 mmol/L 141  Potassium 3.5 - 5.2 mmol/L 4.1  Chloride 96 - 106 mmol/L 101  CO2 20 - 29 mmol/L 24  Calcium 8.7 - 10.3 mg/dL 9.3  Total Protein 6.0 - 8.5 g/dL 6.4  Total Bilirubin 0.0 - 1.2 mg/dL 0.7  Alkaline Phos 39 - 117 IU/L 46  AST 0 - 40 IU/L 27  ALT 0 - 32 IU/L 14   CBC Latest Ref Rng & Units 10/12/2017  WBC 4.0 - 10.5 K/uL 16.2(H)  Hemoglobin 12.0 - 15.0 g/dL 10.0(L)  Hematocrit 36.0 - 46.0 % 32.1(L)  Platelets 150 - 400 K/uL 276    Assessment and plan- Patient is a 84 y.o. female referred for leukocytosis mainly neutrophilia  Leukocytosis: Patient did not have any leukocytosis up until November 2018 when she was first noted to have a white count of 10.9 in August with subsequently increased to 12.  Differential again shows mainly neutrophilia.  She does not have thrombocytopenia.  Her baseline hemoglobin runs around 11 which is gradually dropped down to 10 over the last 2 months.  Patient does not report any recent changes in her medications that would explain her leukocytosis.  Today I will repeat a CBC with differential and check a peripheral smear review.  If her white count continues to trend up I will consider other investigation such as flow cytometry BCR able or Jak 2 mutation testing  Normocytic anemia: Check CBC, ferritin and iron studies, B12 and folate, LDH, myeloma panel, serum free light chains, TSH reticulocyte count and haptoglobin  I will see the patient back in 1 to 2 weeks time to discuss the results of her blood work and further management   Thank you for this kind referral and the opportunity to participate in the care of this patient   Visit Diagnosis 1. Neutrophilia   2. Normocytic anemia     Dr. Randa Evens, MD, MPH [facility] [phone] 10/15/2017 11:20 AM

## 2017-10-19 ENCOUNTER — Ambulatory Visit: Payer: PPO

## 2017-10-19 DIAGNOSIS — E119 Type 2 diabetes mellitus without complications: Secondary | ICD-10-CM | POA: Diagnosis not present

## 2017-10-19 LAB — HM DIABETES EYE EXAM

## 2017-10-21 ENCOUNTER — Telehealth: Payer: Self-pay

## 2017-10-21 ENCOUNTER — Ambulatory Visit: Payer: PPO | Admitting: Physical Therapy

## 2017-10-21 MED ORDER — HYDROCORTISONE 2.5 % RE CREA: 1.0000 "application " | TOPICAL_CREAM | Freq: Two times a day (BID) | RECTAL | 0 refills | Status: DC

## 2017-10-21 NOTE — Telephone Encounter (Signed)
Hydrocortisone 2.5% cream

## 2017-10-26 ENCOUNTER — Ambulatory Visit: Payer: PPO | Admitting: Physical Therapy

## 2017-10-26 ENCOUNTER — Encounter: Payer: Self-pay | Admitting: Physical Therapy

## 2017-10-26 DIAGNOSIS — M25552 Pain in left hip: Secondary | ICD-10-CM | POA: Diagnosis not present

## 2017-10-26 DIAGNOSIS — R2689 Other abnormalities of gait and mobility: Secondary | ICD-10-CM

## 2017-10-26 DIAGNOSIS — R2681 Unsteadiness on feet: Secondary | ICD-10-CM

## 2017-10-26 DIAGNOSIS — M6281 Muscle weakness (generalized): Secondary | ICD-10-CM

## 2017-10-26 NOTE — Therapy (Addendum)
[place] MAIN [facility] SERVICES [address] [place], [place], [address] Phone: [phone]   Fax:  [phone]  Physical Therapy Treatment  Patient Details  Name: Deanna Bird MRN: [id number] Date of Birth: 1928/09/16 Referring Provider (PT): Vladimir Crofts, MD    Encounter Date: 10/26/2017  PT End of Session - 10/26/17 1244    Visit Number  21    Number of Visits  45    Date for PT Re-Evaluation  12/07/17    Authorization Type  progress note 4/10    Authorization Time Period  Start of reporting period 9/24    PT Start Time  1300    PT Stop Time  1345    PT Time Calculation (min)  45 min    Equipment Utilized During Treatment  Gait belt    Activity Tolerance  Patient tolerated treatment well    Behavior During Therapy  [facility] for tasks assessed/performed       Past Medical History:  Diagnosis Date  . Achalasia   . Barrett's esophagus with dysplasia   . Diabetes mellitus without complication ([place])   . GERD (gastroesophageal reflux disease)   . Hyperlipidemia   . Hypertension   . MRSA infection    lung  . Thyroid disease     Past Surgical History:  Procedure Laterality Date  . ABDOMINAL HYSTERECTOMY     complete  . achalasia    . APPENDECTOMY    . HIATAL HERNIA REPAIR    . KNEE ARTHROPLASTY Right 04/01/2016   Procedure: COMPUTER ASSISTED TOTAL KNEE ARTHROPLASTY;  Surgeon: Dereck Leep, MD;  Location: [facility] ORS;  Service: Orthopedics;  Laterality: Right;  . KNEE ARTHROPLASTY Left 11/30/2016   Procedure: COMPUTER ASSISTED TOTAL KNEE ARTHROPLASTY;  Surgeon: Dereck Leep, MD;  Location: [facility] ORS;  Service: Orthopedics;  Laterality: Left;  . PARATHYROIDECTOMY    . THYROID EXPLORATION    . TONSILLECTOMY      There were no vitals filed for this visit.  Subjective Assessment - 10/26/17 1306    Subjective  Patient states she is doing alright today; no hip pain currently but did have hip pain yesterday morning when waking up. Pt  reports greatest pain is during the night when she wakes up laying on that hip.     Pertinent History  Pt is a 84 y/o F who reports she has been diagnosed with spinal stenosis but that she is here for her balance.  Pt reports 3 falls in the past month with one of them occurring yesterday and a total of 4 falls in the past 6 months.  Pt says she is weak in her LLE, causing her to pull on the rail when going up steps, placing a lot of weight through her RUE making her R arm hurt.  Pt denies weakness in LUE. Pt ambulates with her hurry cane at all times. Pt believes her imbalance began in 2017.  Goals: "to be able to walk without feeling like I am going to stumble", working in the yard without losing her balance. Pt reports pain in L buttocks with sit>stand and standing, no pain in sitting. Pain for several months on and off, currently on for 3-4 days. Says it started after sitting on pillow in car.  Pt says she is constipated and is taking dulcolax for this, pt denies night sweats and saddle anesthesia. Pt reports she is tired all of the time and feels "washed out" but denies suicidal thoughts,  feeling down, or feeling depressed.  Told pt this therapist would like to notify pt's MD of pt's feeling of "washed out" but pt politely requests for pt to let MD know during her appointment on 7/12. Pt has lost 50 lbs over the course of 2 years (Dr. Shah aware). Pt contributes this to pneumonia and being on antibiotics. EMG on 3/29 with Impression: "This is an abnormal electrodiagnostic study consistent with a 1) generalized polyneuropathy. 2) superimposed chronic L5 radiculopathy."     Limitations  Lifting;Standing;Walking;House hold activities    How long can you stand comfortably?  10 minutes without UE support    How long can you walk comfortably?  household distances due to fatigue    Diagnostic tests  MRI on 03/29/17: "L4-5 severe facet arthropathy with anterolisthesis. Spinal stenosis is advanced and there is left  L4 foraminal impingement. Elsewhere mild for age degenerative changes as described above. Pt had EMG done on 3/29: "EMG & NCV Findings: Evaluation of the Left peroneal motor nerve showed no response (Ankle) and no response (Ankle). The Left tibial motor nerve showed prolonged distal onset latency (7.0 ms) and reduced amplitude (1.0 mV). The Left ulnar motor nerve showed prolonged distal onset latency (5.0 ms). The Left radial sensory nerve showed prolonged distal peak latency (2.6 ms). The Left superficial peroneal sensory nerve showed no response (14 cm). The Left sural sensory nerve showed no response (Calf). The Left ulnar sensory nerve showed prolonged distal peak latency (3.8 ms) and decreased conduction velocity (Wrist-5th Digit, 32 m/s)".     Patient Stated Goals  see above    Currently in Pain?  No/denies         [facility] PT Assessment - 10/27/17 0001      Strength   Right Hip Flexion  3/5    Right Hip ABduction  2/5    Right Hip ADduction  3+/5    Left Hip Flexion  3+/5    Left Hip ABduction  2/5    Left Hip ADduction  3+/5    Right Knee Flexion  4+/5    Right Knee Extension  4+/5    Left Knee Flexion  4-/5    Left Knee Extension  4-/5    Right Ankle Dorsiflexion  4-/5    Left Ankle Dorsiflexion  4-/5      Standardized Balance Assessment   Five times sit to stand comments   24.4 sec with 1UE support (increased fall risk)    10 Meter Walk  0.46 m/s with quad based hurrycane (indicates increased fall risk)      Berg Balance Test   Sit to Stand  Able to stand  independently using hands    Standing Unsupported  Able to stand safely 2 minutes    Sitting with Back Unsupported but Feet Supported on Floor or Stool  Able to sit safely and securely 2 minutes    Stand to Sit  Controls descent by using hands    Transfers  Able to transfer safely, definite need of hands    Standing Unsupported with Eyes Closed  Able to stand 10 seconds safely    Standing Ubsupported with Feet Together   Able to place feet together independently and stand 1 minute safely    From Standing, Reach Forward with Outstretched Arm  Can reach confidently >25 cm (10")    From Standing Position, Pick up Object from Floor  Able to pick up shoe safely and easily    From Standing Position, Turn   to Look Behind Over each Shoulder  Looks behind from both sides and weight shifts well    Turn 360 Degrees  Able to turn 360 degrees safely but slowly    Standing Unsupported, Alternately Place Feet on Step/Stool  Able to complete 4 steps without aid or supervision    Standing Unsupported, One Foot in Front  Able to take small step independently and hold 30 seconds    Standing on One Leg  Unable to try or needs assist to prevent fall    Total Score  43       Treatment Hooklying Hip ABD with red tband tied above knees with 3 sec holds 2x10 reps  Assessed 5xSTS, 10 meter walk, amb 200 ft, Berg balance, and LE strength.                 PT Education - 10/26/17 1244    Education Details  Exercise technique/form, goal progression    Person(s) Educated  Patient    Methods  Explanation;Demonstration;Verbal cues    Comprehension  Verbalized understanding;Returned demonstration;Verbal cues required;Need further instruction       PT Short Term Goals - 08/19/17 1351      PT SHORT TERM GOAL #1   Title  Pt will independent complete HEP at least 4 days/wk for carryover between sessions    Time  2    Period  Weeks    Status  Achieved    Target Date  08/19/17        PT Long Term Goals - 10/26/17 1308      PT LONG TERM GOAL #1   Title  [name] 5xSTS will improve to equal to or less than 16 seconds without UE assist to demonstrate improved BLE strength    Baseline  24.98 seconds with UE assist: 08/19/17 20.71; 9/24: 16.28 sec with 1UE support; 10/26/17: 24.4 sec with 1UE support    Time  6    Period  Weeks    Status  Partially Met    Target Date  12/07/17      PT LONG TERM GOAL #2   Title  [name] 10mWT  time will improve to equal to or greater than 0.9 m/s to demonstrate improved safety ambulating in the community    Baseline  0.50 m/s;   08/19/17  .55 m/sec; 9/24 using SPC: 0.72 m/s; 10/26/17: 0.46 m/s with SPC    Time  6    Period  Weeks    Status  Partially Met    Target Date  12/07/17      PT LONG TERM GOAL #3   Title  [name] Berg Balance Test score will improve to equal to or greater than 44/56 to demonstrate decreased likelihood of falling    Baseline  32/56 08/26/17 = 40 /56; 9/24: 40/56; 10/26/17: 43/50    Time  6    Period  Weeks    Status  On-going    Target Date  12/07/17      PT LONG TERM GOAL #4   Title  Pt will be able to walk at least 200 ft without a rest break using SPC to demonstrate improved ambulatory endurance    Baseline  Pt limited to ambulating household distances due to fatigue, able to ambulate 200 feet with spc poor quality gait; 9/24: (same, pt veering to the R); 10/26/17: able to ambulate 200 feet with SPC poor quality and veering to the R due to weakness     Time  6      Period  Weeks    Status  Partially Met    Target Date  12/07/17      PT LONG TERM GOAL #5   Title  Pt will report no pain when sleeping or walking in L hip for improved QOL    Baseline  Pain every night if she lies on her L side; 10/26/17: continues to have some pain in L hip when sleeping, not every night but some nights    Time  6    Period  Weeks    Status  On-going    Target Date  12/07/17            Plan - 10/26/17 1449    Clinical Impression Statement  Patient tolerated therapy session well. Pt demonstrates slight increased time in 5xSTS and 10 meter walk test compared to last assessment; indicating increased fall risk. Pt demonstrates improvement in Berg Balance Assessment score compared to last assessment. Pt continues to have increased weakness in BLE specifically in L hip muscles. Pt has no complaints of pain today at start of session or throughout session. Pt will continue to  benefit from skilled PT intervention for improvements in balance, strength, and gait safety.    Rehab Potential  Good    PT Frequency  2x / week    PT Duration  6 weeks    PT Treatment/Interventions  ADLs/Self Care Home Management;Aquatic Therapy;Biofeedback;Cryotherapy;Electrical Stimulation;Iontophoresis 24m/ml Dexamethasone;Moist Heat;Traction;Ultrasound;DME Instruction;Contrast Bath;Gait training;Stair training;Functional mobility training;Therapeutic activities;Therapeutic exercise;Balance training;Neuromuscular re-education;Patient/family education;Orthotic Fit/Training;Manual techniques;Compression bandaging;Passive range of motion;Dry needling;Energy conservation;Taping    PT Next Visit Plan  progress bilateral hip strengthening and balance    PT Home Exercise Plan  sit<>stand with UE assist, tandem stance, romberg with horizontal head turns, marching in standing       Patient will benefit from skilled therapeutic intervention in order to improve the following deficits and impairments:  Abnormal gait, Decreased balance, Decreased endurance, Decreased knowledge of use of DME, Decreased mobility, Decreased range of motion, Decreased safety awareness, Decreased strength, Difficulty walking, Hypomobility, Increased fascial restricitons, Impaired perceived functional ability, Increased muscle spasms, Impaired flexibility, Impaired sensation, Improper body mechanics, Postural dysfunction, Pain, Decreased activity tolerance  Visit Diagnosis: Other abnormalities of gait and mobility  Unsteadiness on feet  Muscle weakness (generalized)     Problem List Patient Active Problem List   Diagnosis Date Noted  . Elevated WBC count 10/06/2017  . Lumbar radiculopathy 06/24/2017  . Waddling gait 03/19/2017  . Gait abnormality 02/26/2017  . Advanced care planning/counseling discussion 08/04/2016  . S/P total knee replacement using cement, left 04/01/2016  . Primary osteoarthritis of right knee  03/19/2016  . Hyperlipidemia 11/19/2014  . Diabetes [place] complication ([place] 075/17/0017 . Nonspecific abnormal results of thyroid function study 07/18/2014  . Hypertension 07/18/2014  . Dysphagia 05/11/2013   KHarriet Masson SPT This entire session was performed under direct supervision and direction of a licensed therapist/therapist assistant . I have personally read, edited and approve of the note as written.   Trotter,Margaret PT, DPT 10/27/2017, 1:26 PM  [facility] [address] [address]Phone: [phone]  Fax:  [phone] Name: Deanna KOELZERMRN: [id number] of Birth: [birth year]

## 2017-10-28 ENCOUNTER — Encounter: Payer: Self-pay | Admitting: Physical Therapy

## 2017-10-28 ENCOUNTER — Ambulatory Visit: Payer: PPO | Admitting: Physical Therapy

## 2017-10-28 DIAGNOSIS — R2681 Unsteadiness on feet: Secondary | ICD-10-CM

## 2017-10-28 DIAGNOSIS — M6281 Muscle weakness (generalized): Secondary | ICD-10-CM

## 2017-10-28 DIAGNOSIS — R2689 Other abnormalities of gait and mobility: Secondary | ICD-10-CM | POA: Diagnosis not present

## 2017-10-28 NOTE — Therapy (Signed)
[place] MAIN [facility] SERVICES [address] [place], [place], [address] Phone: [phone]   Fax:  [phone]  Physical Therapy Treatment  Patient Details  Name: Deanna Bird MRN: [id number] Date of Birth: 1928-10-29 Referring Provider (PT): Vladimir Crofts, MD    Encounter Date: 10/28/2017  PT End of Session - 10/28/17 1440    Visit Number  22    Number of Visits  45    Date for PT Re-Evaluation  12/07/17    Authorization Type  progress note 5/10    Authorization Time Period  Start of reporting period 9/24    PT Start Time  1431    PT Stop Time  1515    PT Time Calculation (min)  44 min    Equipment Utilized During Treatment  Gait belt    Activity Tolerance  Patient tolerated treatment well    Behavior During Therapy  [facility] for tasks assessed/performed       Past Medical History:  Diagnosis Date  . Achalasia   . Barrett's esophagus with dysplasia   . Diabetes mellitus without complication ([place])   . GERD (gastroesophageal reflux disease)   . Hyperlipidemia   . Hypertension   . MRSA infection    lung  . Thyroid disease     Past Surgical History:  Procedure Laterality Date  . ABDOMINAL HYSTERECTOMY     complete  . achalasia    . APPENDECTOMY    . HIATAL HERNIA REPAIR    . KNEE ARTHROPLASTY Right 04/01/2016   Procedure: COMPUTER ASSISTED TOTAL KNEE ARTHROPLASTY;  Surgeon: Dereck Leep, MD;  Location: [facility] ORS;  Service: Orthopedics;  Laterality: Right;  . KNEE ARTHROPLASTY Left 11/30/2016   Procedure: COMPUTER ASSISTED TOTAL KNEE ARTHROPLASTY;  Surgeon: Dereck Leep, MD;  Location: [facility] ORS;  Service: Orthopedics;  Laterality: Left;  . PARATHYROIDECTOMY    . THYROID EXPLORATION    . TONSILLECTOMY      There were no vitals filed for this visit.  Subjective Assessment - 10/28/17 1439    Subjective  Patient reports having pain in left leg this morning but states that its better right now. Denies any pain currently;  denies any new falls; reports, "My legs haven't gotten any stronger."     Pertinent History  Pt is a 84 y/o F who reports she has been diagnosed with spinal stenosis but that she is here for her balance.  Pt reports 3 falls in the past month with one of them occurring yesterday and a total of 4 falls in the past 6 months.  Pt says she is weak in her LLE, causing her to pull on the rail when going up steps, placing a lot of weight through her RUE making her R arm hurt.  Pt denies weakness in LUE. Pt ambulates with her hurry cane at all times. Pt believes her imbalance began in 2017.  Goals: "to be able to walk without feeling like I am going to stumble", working in the yard without losing her balance. Pt reports pain in L buttocks with sit>stand and standing, no pain in sitting. Pain for several months on and off, currently on for 3-4 days. Says it started after sitting on pillow in car.  Pt says she is constipated and is taking dulcolax for this, pt denies night sweats and saddle anesthesia. Pt reports she is tired all of the time and feels "washed out" but denies suicidal thoughts, feeling down, or feeling depressed.  Told pt this therapist would like to notify pt's MD of pt's feeling of "washed out" but pt politely requests for pt to let MD know during her appointment on 7/12. Pt has lost 50 lbs over the course of 2 years (Dr. Manuella Ghazi aware). Pt contributes this to pneumonia and being on antibiotics. EMG on 3/29 with Impression: "This is an abnormal electrodiagnostic study consistent with a 1) generalized polyneuropathy. 2) superimposed chronic L5 radiculopathy."     Limitations  Lifting;Standing;Walking;House hold activities    How long can you stand comfortably?  10 minutes without UE support    How long can you walk comfortably?  household distances due to fatigue    Diagnostic tests  MRI on 03/29/17: "L4-5 severe facet arthropathy with anterolisthesis. Spinal stenosis is advanced and there is left L4  foraminal impingement. Elsewhere mild for age degenerative changes as described above. Pt had EMG done on 3/29: "EMG & NCV Findings: Evaluation of the Left peroneal motor nerve showed no response (Ankle) and no response (Ankle). The Left tibial motor nerve showed prolonged distal onset latency (7.0 ms) and reduced amplitude (1.0 mV). The Left ulnar motor nerve showed prolonged distal onset latency (5.0 ms). The Left radial sensory nerve showed prolonged distal peak latency (2.6 ms). The Left superficial peroneal sensory nerve showed no response (14 cm). The Left sural sensory nerve showed no response (Calf). The Left ulnar sensory nerve showed prolonged distal peak latency (3.8 ms) and decreased conduction velocity (Wrist-5th Digit, 32 m/s)".     Patient Stated Goals  see above    Currently in Pain?  No/denies    Multiple Pain Sites  No            Treatment Supine with 2# ankle weight each LE for strengthening: LE SAQ with ball under leg, 2x15 reps bilaterally with min VCs for technique and positioning; LE SLR flexion 2x10 reps bilaterally with min VCs for positioning and to keep knee straight when lifting leg;   Bridges with arms by side 2x15 with cues for positioning for better strengthening;    Sidelying: LLE hip abduction clamshells red tband 2x15; VCs for keeping pelvis forward and min A for keeping ankles together   Prone 2# ankle weight for strengthening Alternate hamstring curl x10 bilaterally;  SLR LE only  2x10 reps bilaterally Glute kickbacks LE only 2x10 reps bilaterally VCs required for proper technique and positioning; min A required for initiating beginning reps as well as palpating muscle contraction for pt to know where muscles are working; demonstrated increased difficulty with these exercises  Patient supine: PT performed passive SLR hamstring stretch 20 sec hold x2 reps bilaterally PT performed passive single knee to chest stretch 20 sec hold x1 rep bilaterally   Stretches performed to reduce tightness and reduce delayed onset muscle soreness from advanced exercise;    Leg press: BLE plate 75# 0S11, VCs for slow eccentric return for better strengthening and control LLE only 45# 2x10, VCs for continuing slow return as well as keeping pelvis in neutral to not utilize back muscles to push through leg    Patient ambulated better with improved step length and less lateral shift following session;               PT Education - 10/28/17 1440    Education Details  exercise/strengthening, HEP reinforced;     Person(s) Educated  Patient    Methods  Explanation;Demonstration;Verbal cues    Comprehension  Verbalized understanding;Returned demonstration;Verbal cues required;Need further instruction  PT Short Term Goals - 08/19/17 1351      PT SHORT TERM GOAL #1   Title  Pt will independent complete HEP at least 4 days/wk for carryover between sessions    Time  2    Period  Weeks    Status  Achieved    Target Date  08/19/17        PT Long Term Goals - 10/26/17 1308      PT LONG TERM GOAL #1   Title  [name] 5xSTS will improve to equal to or less than 16 seconds without UE assist to demonstrate improved BLE strength    Baseline  24.98 seconds with UE assist: 08/19/17 20.71; 9/24: 16.28 sec with 1UE support; 10/26/17: 24.4 sec with 1UE support    Time  6    Period  Weeks    Status  Partially Met    Target Date  12/07/17      PT LONG TERM GOAL #2   Title  [name] 53mT time will improve to equal to or greater than 0.9 m/s to demonstrate improved safety ambulating in the community    Baseline  0.50 m/s;   08/19/17  .55 m/sec; 9/24 using SPC: 0.72 m/s; 10/26/17: 0.46 m/s with SPC    Time  6    Period  Weeks    Status  Partially Met    Target Date  12/07/17      PT LONG TERM GOAL #3   Title  [name] Berg Balance Test score will improve to equal to or greater than 44/56 to demonstrate decreased likelihood of falling    Baseline  32/56  08/26/17 = 40 /56; 9/24: 40/56; 10/26/17: 43/50    Time  6    Period  Weeks    Status  On-going    Target Date  12/07/17      PT LONG TERM GOAL #4   Title  Pt will be able to walk at least 200 ft without a rest break using SPC to demonstrate improved ambulatory endurance    Baseline  Pt limited to ambulating household distances due to fatigue, able to ambulate 200 feet with spc poor quality gait; 9/24: (same, pt veering to the R); 10/26/17: able to ambulate 200 feet with SPC poor quality and veering to the R due to weakness     Time  6    Period  Weeks    Status  Partially Met    Target Date  12/07/17      PT LONG TERM GOAL #5   Title  Pt will report no pain when sleeping or walking in L hip for improved QOL    Baseline  Pain every night if she lies on her L side; 10/26/17: continues to have some pain in L hip when sleeping, not every night but some nights    Time  6    Period  Weeks    Status  On-going    Target Date  12/07/17            Plan - 10/28/17 1442    Clinical Impression Statement  Patient instructed in advanced LE strengthening exercise. Increased resistance/repetition for better strengthening. Patient does require min Vcs for correct exercise technique. She had increased difficulty with straight leg raises due to weakness in hip. She reports increased fatigue with advanced exercise, but denies any increase in pain or discomfort. Patient would benefit from additional skilled PT intervention to improve strength, balance and gait safety;  Rehab Potential  Good    PT Frequency  2x / week    PT Duration  6 weeks    PT Treatment/Interventions  ADLs/Self Care Home Management;Aquatic Therapy;Biofeedback;Cryotherapy;Electrical Stimulation;Iontophoresis 14m/ml Dexamethasone;Moist Heat;Traction;Ultrasound;DME Instruction;Contrast Bath;Gait training;Stair training;Functional mobility training;Therapeutic activities;Therapeutic exercise;Balance training;Neuromuscular  re-education;Patient/family education;Orthotic Fit/Training;Manual techniques;Compression bandaging;Passive range of motion;Dry needling;Energy conservation;Taping    PT Next Visit Plan  progress bilateral hip strengthening and balance    PT Home Exercise Plan  sit<>stand with UE assist, tandem stance, romberg with horizontal head turns, marching in standing       Patient will benefit from skilled therapeutic intervention in order to improve the following deficits and impairments:  Abnormal gait, Decreased balance, Decreased endurance, Decreased knowledge of use of DME, Decreased mobility, Decreased range of motion, Decreased safety awareness, Decreased strength, Difficulty walking, Hypomobility, Increased fascial restricitons, Impaired perceived functional ability, Increased muscle spasms, Impaired flexibility, Impaired sensation, Improper body mechanics, Postural dysfunction, Pain, Decreased activity tolerance  Visit Diagnosis: Other abnormalities of gait and mobility  Unsteadiness on feet  Muscle weakness (generalized)     Problem List Patient Active Problem List   Diagnosis Date Noted  . Elevated WBC count 10/06/2017  . Lumbar radiculopathy 06/24/2017  . Waddling gait 03/19/2017  . Gait abnormality 02/26/2017  . Advanced care planning/counseling discussion 08/04/2016  . S/P total knee replacement using cement, left 04/01/2016  . Primary osteoarthritis of right knee 03/19/2016  . Hyperlipidemia 11/19/2014  . Diabetes mellitus without complication ([place] 078/24/2353 . Nonspecific abnormal results of thyroid function study 07/18/2014  . Hypertension 07/18/2014  . Dysphagia 05/11/2013    Kevork Joyce PT, DPT 10/28/2017, 2:53 PM  [place] [facility] [address]RG[address] [place] [address]Phone: [phone]  Fax:  [phone] Name: Deanna BYRONMRN: [id number] of Birth: 903-06-1928

## 2017-10-29 ENCOUNTER — Encounter: Payer: Self-pay | Admitting: Oncology

## 2017-10-29 ENCOUNTER — Inpatient Hospital Stay (HOSPITAL_BASED_OUTPATIENT_CLINIC_OR_DEPARTMENT_OTHER): Payer: PPO | Admitting: Oncology

## 2017-10-29 ENCOUNTER — Inpatient Hospital Stay: Payer: PPO

## 2017-10-29 VITALS — BP 118/66 | HR 68 | Temp 97.2°F | Resp 18 | Ht 63.5 in | Wt 119.3 lb

## 2017-10-29 DIAGNOSIS — E119 Type 2 diabetes mellitus without complications: Secondary | ICD-10-CM

## 2017-10-29 DIAGNOSIS — D72829 Elevated white blood cell count, unspecified: Secondary | ICD-10-CM | POA: Diagnosis not present

## 2017-10-29 DIAGNOSIS — E785 Hyperlipidemia, unspecified: Secondary | ICD-10-CM

## 2017-10-29 DIAGNOSIS — D649 Anemia, unspecified: Secondary | ICD-10-CM | POA: Diagnosis not present

## 2017-10-29 DIAGNOSIS — I10 Essential (primary) hypertension: Secondary | ICD-10-CM

## 2017-10-29 LAB — CBC WITH DIFFERENTIAL/PLATELET
Abs Immature Granulocytes: 0.04 10*3/uL (ref 0.00–0.07)
BASOS PCT: 0 %
Basophils Absolute: 0 10*3/uL (ref 0.0–0.1)
EOS ABS: 0 10*3/uL (ref 0.0–0.5)
Eosinophils Relative: 0 %
HCT: 32.6 % — ABNORMAL LOW (ref 36.0–46.0)
Hemoglobin: 10.2 g/dL — ABNORMAL LOW (ref 12.0–15.0)
Immature Granulocytes: 0 %
Lymphocytes Relative: 9 %
Lymphs Abs: 1.3 10*3/uL (ref 0.7–4.0)
MCH: 29.1 pg (ref 26.0–34.0)
MCHC: 31.3 g/dL (ref 30.0–36.0)
MCV: 92.9 fL (ref 80.0–100.0)
MONO ABS: 0.6 10*3/uL (ref 0.1–1.0)
Monocytes Relative: 4 %
Neutro Abs: 12.7 10*3/uL — ABNORMAL HIGH (ref 1.7–7.7)
Neutrophils Relative %: 87 %
PLATELETS: 329 10*3/uL (ref 150–400)
RBC: 3.51 MIL/uL — AB (ref 3.87–5.11)
RDW: 13.6 % (ref 11.5–15.5)
WBC: 14.7 10*3/uL — AB (ref 4.0–10.5)
nRBC: 0 % (ref 0.0–0.2)

## 2017-10-29 NOTE — Progress Notes (Signed)
No new changes noted today 

## 2017-11-01 NOTE — Progress Notes (Signed)
Hematology/Oncology Consult note [facility]  Telephone:([phone] [phone]  Patient Care Team: Guadalupe Maple, MD as PCP - General (Family Medicine)   Name of the patient: Deanna Bird  [id number]  September 06, 1928   Date of visit: 11/01/17  Diagnosis- leucocytosis mainly neutrophilia. Likely reactive  Chief complaint/ Reason for visit- discuss results of bloodwork  Heme/Onc history: patient is a 84 year old Caucasian female who has been referred to [place] for leukocytosis.  Patient has past medical history significant for spinal stenosis and ongoing radiculopathy.  She also has hypertension diabetes and hyperlipidemia.  Patient was noted to have a normal white count up until November 2018.  Her white count was 10.9 in August 2019 and further increased to 14.3 on 10/05/2017.  Differential mainly shows neutrophilia.  Platelet counts are normal.  Her hemoglobin has been around 11 up until last year and then presently down to 10.2.  She reports some trouble with her balance.  Her appetite is good and she denies any unintentional weight loss.  Denies any recurrent infections or hospitalizations.  Results of blood from 10/12/2017 were as follows: CBC showed white count of 16.2, H&H of 10.0 and 32.1.  Differential mainly showed neutrophilia with an absolute neutrophil count of 10.7.  Platelet count was normal at 276.  Multiple myeloma panel showed no M protein.  Serum free light chain ratio was abnormal at 1.8.  With elevated kappa and lambda light chain at 73 and 40 respectively.  Folate was normal at 17.  B12 was elevated at 1236.  Ferritin was normal at 161.  Iron studies showed a low iron saturation of 7% and a low serum iron of 17.  Haptoglobin was elevated at 237.  Reticulocyte count was low normal at 2% for the degree of anemia.  Interval history-her son was recently diagnosed with metastatic renal cell carcinoma.  She is reporting some stress because of back.   Otherwise she is doing well and other than her chronic back pain she denies any other complaints at this time.  ECOG PS- 2 Pain scale- 0 Opioid associated constipation- no  Review of systems- Review of Systems  Constitutional: Positive for malaise/fatigue. Negative for chills, fever and weight loss.  HENT: Negative for congestion, ear discharge and nosebleeds.   Eyes: Negative for blurred vision.  Respiratory: Negative for cough, hemoptysis, sputum production, shortness of breath and wheezing.   Cardiovascular: Negative for chest pain, palpitations, orthopnea and claudication.  Gastrointestinal: Negative for abdominal pain, blood in stool, constipation, diarrhea, heartburn, melena, nausea and vomiting.  Genitourinary: Negative for dysuria, flank pain, frequency, hematuria and urgency.  Musculoskeletal: Positive for back pain. Negative for joint pain and myalgias.  Skin: Negative for rash.  Neurological: Negative for dizziness, tingling, focal weakness, seizures, weakness and headaches.  Endo/Heme/Allergies: Does not bruise/bleed easily.  Psychiatric/Behavioral: Negative for depression and suicidal ideas. The patient does not have insomnia.       Allergies  Allergen Reactions  . Procaine Palpitations and Other (See Comments)    Rapid heartbeat     Past Medical History:  Diagnosis Date  . Achalasia   . Barrett's esophagus with dysplasia   . Diabetes mellitus without complication ([place])   . GERD (gastroesophageal reflux disease)   . Hyperlipidemia   . Hypertension   . Kidney cancer, primary, with metastasis from kidney to other site [facility])    Son  . MRSA infection    lung  . Thyroid disease  Past Surgical History:  Procedure Laterality Date  . ABDOMINAL HYSTERECTOMY     complete  . achalasia    . APPENDECTOMY    . HIATAL HERNIA REPAIR    . KNEE ARTHROPLASTY Right 04/01/2016   Procedure: COMPUTER ASSISTED TOTAL KNEE ARTHROPLASTY;  Surgeon: Dereck Leep, MD;   Location: [facility] ORS;  Service: Orthopedics;  Laterality: Right;  . KNEE ARTHROPLASTY Left 11/30/2016   Procedure: COMPUTER ASSISTED TOTAL KNEE ARTHROPLASTY;  Surgeon: Dereck Leep, MD;  Location: [facility] ORS;  Service: Orthopedics;  Laterality: Left;  . PARATHYROIDECTOMY    . THYROID EXPLORATION    . TONSILLECTOMY      Social History   Socioeconomic History  . Marital status: Married    Spouse name: Not on file  . Number of children: Not on file  . Years of education: 42  . Highest education level: High school graduate  Occupational History  . Not on file  Social Needs  . Financial resource strain: Not hard at all  . Food insecurity:    Worry: Never true    Inability: Never true  . Transportation needs:    Medical: No    Non-medical: No  Tobacco Use  . Smoking status: Former Smoker    Packs/day: 0.25    Years: 1.00    Pack years: 0.25    Last attempt to quit: 10/13/1962    Years since quitting: 55.0  . Smokeless tobacco: Never Used  . Tobacco comment: smoked one pack a week for 1 year - 55 years ago  Substance and Sexual Activity  . Alcohol use: No  . Drug use: No  . Sexual activity: Not on file  Lifestyle  . Physical activity:    Days per week: 0 days    Minutes per session: 0 min  . Stress: Not at all  Relationships  . Social connections:    Talks on phone: More than three times a week    Gets together: More than three times a week    Attends religious service: More than 4 times per year    Active member of club or organization: No    Attends meetings of clubs or organizations: Never    Relationship status: Married  . Intimate partner violence:    Fear of current or ex partner: No    Emotionally abused: No    Physically abused: No    Forced sexual activity: No  Other Topics Concern  . Not on file  Social History Narrative  . Not on file    Family History  Problem Relation Age of Onset  . Heart disease Mother   . Diabetes Father   . Lung cancer Sister    . Diabetes Sister   . Stomach cancer Brother   . CAD Brother   . Kidney cancer Son      Current Outpatient Medications:  .  Alpha-Lipoic Acid 200 MG TABS, Take 200 mg by mouth 2 (two) times daily. , Disp: , Rfl:  .  Cholecalciferol (VITAMIN D-3) 5000 units TABS, Take 5,000 Units daily with supper by mouth. , Disp: , Rfl:  .  docusate sodium (COLACE) 100 MG capsule, Take 200 mg by mouth at bedtime., Disp: , Rfl:  .  Fenofibric Acid 105 MG TABS, Take 1 tablet (105 mg total) by mouth daily., Disp: 30 tablet, Rfl: 12 .  glimepiride (AMARYL) 1 MG tablet, Take 1 tablet (1 mg total) by mouth daily with breakfast., Disp: 30 tablet, Rfl: 12 .  glucose blood (ONE TOUCH ULTRA TEST) test strip, 2 (two) times daily., Disp: , Rfl:  .  hydrocortisone (ANUSOL-HC) 2.5 % rectal cream, Place 1 application rectally 2 (two) times daily., Disp: 30 g, Rfl: 0 .  losartan-hydrochlorothiazide (HYZAAR) 100-25 MG tablet, Take 1 tablet by mouth daily., Disp: 30 tablet, Rfl: 12 .  Melatonin 1 MG CAPS, Take 1 capsule at bedtime as needed by mouth., Disp: , Rfl:  .  metoprolol succinate (TOPROL-XL) 100 MG 24 hr tablet, Take 1 tablet (100 mg total) by mouth daily. Take with or immediately following a meal., Disp: 30 tablet, Rfl: 12 .  Multiple Vitamin (MULTIVITAMINS PO), Take 1 packet by mouth 2 (two) times daily. Forward Gold Daily Regimen (multivitamin/bone&balance/omega 3) By Dr. Lanell Matar, Disp: , Rfl:  .  Probiotic Product (PROBIOTIC PO), Take 1 capsule by mouth daily before breakfast., Disp: , Rfl:  .  PROCTO-MED HC 2.5 % rectal cream, Place 1 application rectally 2 (two) times daily., Disp: 30 g, Rfl: 0  Physical exam:  Vitals:   10/29/17 1438  BP: 118/66  Pulse: 68  Resp: 18  Temp: (!) 97.2 F (36.2 C)  TempSrc: Tympanic  SpO2: 98%  Weight: 119 lb 4.8 oz (54.1 kg)  Height: 5' 3.5" (1.613 m)   Physical Exam  Constitutional: She is oriented to person, place, and time.  Thin elderly lady in no acute  distress  HENT:  Head: Normocephalic and atraumatic.  Eyes: Pupils are equal, round, and reactive to light. EOM are normal.  Neck: Normal range of motion.  Cardiovascular: Normal rate, regular rhythm and normal heart sounds.  Pulmonary/Chest: Effort normal and breath sounds normal.  Abdominal: Soft. Bowel sounds are normal.  Neurological: She is alert and oriented to person, place, and time.  Skin: Skin is warm and dry.     CMP Latest Ref Rng & Units 08/10/2017  Glucose 65 - 99 mg/dL 96  BUN 8 - 27 mg/dL 23  Creatinine 0.57 - 1.00 mg/dL 1.17(H)  Sodium 134 - 144 mmol/L 141  Potassium 3.5 - 5.2 mmol/L 4.1  Chloride 96 - 106 mmol/L 101  CO2 20 - 29 mmol/L 24  Calcium 8.7 - 10.3 mg/dL 9.3  Total Protein 6.0 - 8.5 g/dL 6.4  Total Bilirubin 0.0 - 1.2 mg/dL 0.7  Alkaline Phos 39 - 117 IU/L 46  AST 0 - 40 IU/L 27  ALT 0 - 32 IU/L 14   CBC Latest Ref Rng & Units 10/29/2017  WBC 4.0 - 10.5 K/uL 14.7(H)  Hemoglobin 12.0 - 15.0 g/dL 10.2(L)  Hematocrit 36.0 - 46.0 % 32.6(L)  Platelets 150 - 400 K/uL 329    No images are attached to the encounter.  No results found.   Assessment and plan- Patient is a 84 y.o. female with following issues:  1.  Leukocytosis mainly neutrophilia.  Patient's white count over the last 2 months has increased from 10 to 14-16.  No evidence of acute infection.  Differential mainly shows neutrophilia and I still suspect that this is reactive.  But given that the white count is consistently increasing I will repeat a CBC with differential today along with BCR able fish, Jak 2 mutation testing as well as peripheral flow cytometry.  2.  With regards to her anemia her work-up was essentially unremarkable except for an elevated free light chain ratio 1.8.  No M protein and myeloma panel.  She did have some evidence of iron deficiency given the low iron saturation in the low serum  iron.  I recommended a trial of oral versus IV iron.  Patient prefers to take oral iron  and will take 325 mg twice a day.  I will see her back in 1 month's time with repeat CBC with differential as well as iron studies.  If there is no significant improvement in her hemoglobin I will consider giving her IV iron at that time.  If her white count continues to increase or if her anemia worsens I will consider doing a bone marrow biopsy at that time given her elevated free light chain ratio.  However patient is elderly and does not want to proceed with a bone marrow biopsy at this time   Visit Diagnosis 1. Leukocytosis, unspecified type   2. Normocytic anemia      Dr. Randa Evens, MD, MPH [facility] [phone] 11/01/2017 10:10 AM

## 2017-11-02 ENCOUNTER — Ambulatory Visit: Payer: PPO | Admitting: Physical Therapy

## 2017-11-03 ENCOUNTER — Emergency Department: Payer: PPO

## 2017-11-03 ENCOUNTER — Other Ambulatory Visit: Payer: Self-pay

## 2017-11-03 ENCOUNTER — Inpatient Hospital Stay
Admission: EM | Admit: 2017-11-03 | Discharge: 2017-11-05 | DRG: 195 | Disposition: A | Discharge status: Home / Self Care | Payer: PPO | Attending: Internal Medicine | Admitting: Internal Medicine

## 2017-11-03 ENCOUNTER — Encounter: Payer: Self-pay | Admitting: Emergency Medicine

## 2017-11-03 DIAGNOSIS — Z23 Encounter for immunization: Secondary | ICD-10-CM

## 2017-11-03 DIAGNOSIS — D72829 Elevated white blood cell count, unspecified: Secondary | ICD-10-CM | POA: Diagnosis not present

## 2017-11-03 DIAGNOSIS — Z96653 Presence of artificial knee joint, bilateral: Secondary | ICD-10-CM | POA: Diagnosis not present

## 2017-11-03 DIAGNOSIS — J189 Pneumonia, unspecified organism: Secondary | ICD-10-CM | POA: Diagnosis not present

## 2017-11-03 DIAGNOSIS — Z66 Do not resuscitate: Secondary | ICD-10-CM | POA: Diagnosis not present

## 2017-11-03 DIAGNOSIS — R079 Chest pain, unspecified: Secondary | ICD-10-CM | POA: Diagnosis not present

## 2017-11-03 DIAGNOSIS — Z7984 Long term (current) use of oral hypoglycemic drugs: Secondary | ICD-10-CM

## 2017-11-03 DIAGNOSIS — K220 Achalasia of cardia: Secondary | ICD-10-CM | POA: Diagnosis not present

## 2017-11-03 DIAGNOSIS — I10 Essential (primary) hypertension: Secondary | ICD-10-CM | POA: Diagnosis not present

## 2017-11-03 DIAGNOSIS — E785 Hyperlipidemia, unspecified: Secondary | ICD-10-CM | POA: Diagnosis present

## 2017-11-03 DIAGNOSIS — Z87891 Personal history of nicotine dependence: Secondary | ICD-10-CM

## 2017-11-03 DIAGNOSIS — E039 Hypothyroidism, unspecified: Secondary | ICD-10-CM | POA: Diagnosis present

## 2017-11-03 DIAGNOSIS — Z8614 Personal history of Methicillin resistant Staphylococcus aureus infection: Secondary | ICD-10-CM | POA: Diagnosis not present

## 2017-11-03 DIAGNOSIS — E119 Type 2 diabetes mellitus without complications: Secondary | ICD-10-CM | POA: Diagnosis not present

## 2017-11-03 DIAGNOSIS — Z85528 Personal history of other malignant neoplasm of kidney: Secondary | ICD-10-CM

## 2017-11-03 DIAGNOSIS — Z884 Allergy status to anesthetic agent status: Secondary | ICD-10-CM

## 2017-11-03 DIAGNOSIS — K2270 Barrett's esophagus without dysplasia: Secondary | ICD-10-CM | POA: Diagnosis not present

## 2017-11-03 LAB — BASIC METABOLIC PANEL
ANION GAP: 7 (ref 5–15)
BUN: 25 mg/dL — ABNORMAL HIGH (ref 8–23)
CHLORIDE: 100 mmol/L (ref 98–111)
CO2: 27 mmol/L (ref 22–32)
Calcium: 8.8 mg/dL — ABNORMAL LOW (ref 8.9–10.3)
Creatinine, Ser: 1.26 mg/dL — ABNORMAL HIGH (ref 0.44–1.00)
GFR calc Af Amer: 43 mL/min — ABNORMAL LOW (ref >60)
GFR, EST NON AFRICAN AMERICAN: 37 mL/min — AB (ref >60)
GLUCOSE: 305 mg/dL — AB (ref 70–99)
POTASSIUM: 4 mmol/L (ref 3.5–5.1)
Sodium: 134 mmol/L — ABNORMAL LOW (ref 135–145)

## 2017-11-03 LAB — TROPONIN I

## 2017-11-03 LAB — LACTIC ACID, PLASMA
LACTIC ACID, VENOUS: 1.9 mmol/L (ref 0.5–1.9)
Lactic Acid, Venous: 2.7 mmol/L (ref 0.5–1.9)
Lactic Acid, Venous: 1.1 mmol/L (ref 0.5–1.9)

## 2017-11-03 LAB — COMPREHENSIVE METABOLIC PANEL
ALT: 15 U/L (ref 0–44)
AST: 22 U/L (ref 15–41)
Albumin: 2.7 g/dL — ABNORMAL LOW (ref 3.5–5.0)
Alkaline Phosphatase: 56 U/L (ref 38–126)
Anion gap: 8 (ref 5–15)
BUN: 25 mg/dL — AB (ref 8–23)
CHLORIDE: 99 mmol/L (ref 98–111)
CO2: 29 mmol/L (ref 22–32)
CREATININE: 1.18 mg/dL — AB (ref 0.44–1.00)
Calcium: 9.3 mg/dL (ref 8.9–10.3)
GFR calc Af Amer: 47 mL/min — ABNORMAL LOW (ref >60)
GFR, EST NON AFRICAN AMERICAN: 40 mL/min — AB (ref >60)
Glucose, Bld: 222 mg/dL — ABNORMAL HIGH (ref 70–99)
Potassium: 4.1 mmol/L (ref 3.5–5.1)
Sodium: 136 mmol/L (ref 135–145)
Total Bilirubin: 0.9 mg/dL (ref 0.3–1.2)
Total Protein: 6.9 g/dL (ref 6.5–8.1)

## 2017-11-03 LAB — CBC
HCT: 32.5 % — ABNORMAL LOW (ref 36.0–46.0)
HEMOGLOBIN: 10.2 g/dL — AB (ref 12.0–15.0)
MCH: 29.4 pg (ref 26.0–34.0)
MCHC: 31.4 g/dL (ref 30.0–36.0)
MCV: 93.7 fL (ref 80.0–100.0)
Platelets: 281 10*3/uL (ref 150–400)
RBC: 3.47 MIL/uL — AB (ref 3.87–5.11)
RDW: 13.6 % (ref 11.5–15.5)
WBC: 31.6 10*3/uL — ABNORMAL HIGH (ref 4.0–10.5)
nRBC: 0 % (ref 0.0–0.2)

## 2017-11-03 LAB — FIBRIN DERIVATIVES D-DIMER (ARMC ONLY): FIBRIN DERIVATIVES D-DIMER (ARMC): 1040.77 ng{FEU}/mL — AB (ref 0.00–499.00)

## 2017-11-03 LAB — GLUCOSE, CAPILLARY: Glucose-Capillary: 155 mg/dL — ABNORMAL HIGH (ref 70–99)

## 2017-11-03 MED ORDER — LOSARTAN POTASSIUM 50 MG PO TABS
100.0000 mg | ORAL_TABLET | Freq: Every day | ORAL | Status: DC
  Administered 2017-11-04 – 2017-11-05 (×2): 100 mg via ORAL
  Filled 2017-11-03 (×2): qty 2

## 2017-11-03 MED ORDER — INFLUENZA VAC SPLIT HIGH-DOSE 0.5 ML IM SUSY
0.5000 mL | PREFILLED_SYRINGE | INTRAMUSCULAR | Status: AC
  Administered 2017-11-05: 0.5 mL via INTRAMUSCULAR
  Filled 2017-11-03: qty 0.5

## 2017-11-03 MED ORDER — INSULIN ASPART 100 UNIT/ML ~~LOC~~ SOLN: 0.0000 [IU] | Freq: Every day | SUBCUTANEOUS | Status: DC

## 2017-11-03 MED ORDER — PNEUMOCOCCAL VAC POLYVALENT 25 MCG/0.5ML IJ INJ
0.5000 mL | INJECTION | INTRAMUSCULAR | Status: AC
  Administered 2017-11-05: 09:00:00 0.5 mL via INTRAMUSCULAR
  Filled 2017-11-03: qty 0.5

## 2017-11-03 MED ORDER — ENOXAPARIN SODIUM 30 MG/0.3ML ~~LOC~~ SOLN
30.0000 mg | SUBCUTANEOUS | Status: DC
  Administered 2017-11-03 – 2017-11-04 (×2): 30 mg via SUBCUTANEOUS
  Filled 2017-11-03 (×2): qty 0.3

## 2017-11-03 MED ORDER — SODIUM CHLORIDE 0.9 % IV SOLN
1.0000 g | INTRAVENOUS | Status: DC
  Administered 2017-11-03: 1 g via INTRAVENOUS
  Filled 2017-11-03 (×2): qty 10

## 2017-11-03 MED ORDER — FENOFIBRATE 54 MG PO TABS
108.0000 mg | ORAL_TABLET | Freq: Every day | ORAL | Status: DC
  Filled 2017-11-03: qty 2

## 2017-11-03 MED ORDER — AZITHROMYCIN 500 MG PO TABS
500.0000 mg | ORAL_TABLET | Freq: Every day | ORAL | Status: DC
  Administered 2017-11-03: 500 mg via ORAL
  Filled 2017-11-03: qty 1

## 2017-11-03 MED ORDER — LOSARTAN POTASSIUM-HCTZ 100-25 MG PO TABS: 1.0000 | ORAL_TABLET | Freq: Every day | ORAL | Status: DC

## 2017-11-03 MED ORDER — ACETAMINOPHEN 650 MG RE SUPP: 650.0000 mg | Freq: Four times a day (QID) | RECTAL | Status: DC | PRN

## 2017-11-03 MED ORDER — IBUPROFEN 400 MG PO TABS: 400.0000 mg | ORAL_TABLET | Freq: Four times a day (QID) | ORAL | Status: DC | PRN

## 2017-11-03 MED ORDER — SODIUM CHLORIDE 0.9 % IV SOLN: 1.0000 g | Freq: Once | INTRAVENOUS | Status: DC

## 2017-11-03 MED ORDER — POLYETHYLENE GLYCOL 3350 17 G PO PACK: 17.0000 g | PACK | Freq: Every day | ORAL | Status: DC | PRN

## 2017-11-03 MED ORDER — ONDANSETRON HCL 4 MG/2ML IJ SOLN: 4.0000 mg | Freq: Four times a day (QID) | INTRAMUSCULAR | Status: DC | PRN

## 2017-11-03 MED ORDER — MELATONIN 5 MG PO TABS
5.0000 mg | ORAL_TABLET | Freq: Every evening | ORAL | Status: DC | PRN
  Administered 2017-11-03: 5 mg via ORAL
  Filled 2017-11-03 (×2): qty 1

## 2017-11-03 MED ORDER — SODIUM CHLORIDE 0.9 % IV BOLUS
1000.0000 mL | Freq: Once | INTRAVENOUS | Status: AC
  Administered 2017-11-03: 1000 mL via INTRAVENOUS

## 2017-11-03 MED ORDER — ONDANSETRON HCL 4 MG PO TABS: 4.0000 mg | ORAL_TABLET | Freq: Four times a day (QID) | ORAL | Status: DC | PRN

## 2017-11-03 MED ORDER — ALBUTEROL SULFATE (2.5 MG/3ML) 0.083% IN NEBU: 2.5000 mg | INHALATION_SOLUTION | RESPIRATORY_TRACT | Status: DC | PRN

## 2017-11-03 MED ORDER — FERROUS SULFATE 325 (65 FE) MG PO TABS
325.0000 mg | ORAL_TABLET | Freq: Two times a day (BID) | ORAL | Status: DC
  Administered 2017-11-03 – 2017-11-05 (×4): 325 mg via ORAL
  Filled 2017-11-03 (×4): qty 1

## 2017-11-03 MED ORDER — METOPROLOL SUCCINATE ER 50 MG PO TB24
100.0000 mg | ORAL_TABLET | Freq: Every day | ORAL | Status: DC
  Administered 2017-11-04: 09:00:00 100 mg via ORAL
  Filled 2017-11-03 (×2): qty 2

## 2017-11-03 MED ORDER — HYDROCORTISONE 2.5 % RE CREA
1.0000 "application " | TOPICAL_CREAM | Freq: Two times a day (BID) | RECTAL | Status: DC | PRN
  Filled 2017-11-03: qty 28.35

## 2017-11-03 MED ORDER — OXYCODONE HCL 5 MG PO TABS
5.0000 mg | ORAL_TABLET | ORAL | Status: DC | PRN
  Administered 2017-11-03 – 2017-11-04 (×2): 5 mg via ORAL
  Filled 2017-11-03 (×2): qty 1

## 2017-11-03 MED ORDER — INSULIN ASPART 100 UNIT/ML ~~LOC~~ SOLN
0.0000 [IU] | Freq: Three times a day (TID) | SUBCUTANEOUS | Status: DC
  Filled 2017-11-03: qty 1

## 2017-11-03 MED ORDER — ACETAMINOPHEN 325 MG PO TABS: 650.0000 mg | ORAL_TABLET | Freq: Four times a day (QID) | ORAL | Status: DC | PRN

## 2017-11-03 MED ORDER — HYDROCHLOROTHIAZIDE 25 MG PO TABS
25.0000 mg | ORAL_TABLET | Freq: Every day | ORAL | Status: DC
  Administered 2017-11-04 – 2017-11-05 (×2): 25 mg via ORAL
  Filled 2017-11-03 (×2): qty 1

## 2017-11-03 NOTE — Progress Notes (Signed)
CRITICAL VALUE STICKER  CRITICAL VALUE: 2.7 Lactic acid  RECEIVER (on-site recipient of call):Deanna Bird  DATE & TIME NOTIFIED: 11/03/17  2055  MESSENGER (representative from lab):  MD NOTIFIED: hospitalist on call  TIME OF NOTIFICATION: 11/03/17  2055  RESPONSE: pending

## 2017-11-03 NOTE — Progress Notes (Signed)
CODE SEPSIS - PHARMACY COMMUNICATION  **Broad Spectrum Antibiotics should be administered within 1 hour of Sepsis diagnosis**  Time Code Sepsis Called/Page Received: 17:07  Antibiotics Ordered: cefepime was changed to ceftriaxone/azithromycin  Time of 1st antibiotic administration: 18:05  Additional action taken by pharmacy: 17:45 spoke with Remo Lipps RN says he'll get it administered.  If necessary, Name of Provider/Nurse Contacted:     Laural Benes, Pharm.D., BCPS Clinical Pharmacist  11/03/2017  5:09 PM

## 2017-11-03 NOTE — Progress Notes (Signed)
Advance care planning  Purpose of Encounter Pneumonia, CODE STATUS discussion  Parties in Attendance Patient and been/daughter  Patients Decisional capacity Patient alert and oriented.  Able to make medical decisions.  Has documentation of healthcare power of attorney and advanced directives  Encouraged to bring copies to keep in the hospital  Discussed with patient regarding her pneumonia and elevated WBC.  Treatment plan and prognosis discussed.  All questions answered  Discussed CODE STATUS.  Patient after discussing with her daughter at bedside has decided she wants to be DO NOT RESUSCITATE and DO NOT INTUBATE.  CODE STATUS changed.  Orders entered.  Time spent -20 minutes

## 2017-11-03 NOTE — ED Triage Notes (Signed)
PT c/o RUQ/ side and breast pain since yesterday. Denies any acute injury. Pt A&OX4. Denies GU symptoms

## 2017-11-03 NOTE — ED Provider Notes (Signed)
[facility] Emergency Department Provider Note   ____________________________________________   First MD Initiated Contact with Patient 11/03/17 1702     (approximate)  I have reviewed the triage vital signs and the nursing notes.   HISTORY  Chief Complaint Chest Pain   HPI Deanna Bird is a 84 y.o. female patient reports several days of postnasal drip waking up in the middle of night so she could spit it out.  She has had 2 days of cough and right-sided chest pain that is worse when she coughs or takes a deep breath.  She has not been running a fever.  Today is hurting too much to cough much.   Past Medical History:  Diagnosis Date  . Achalasia   . Barrett's esophagus with dysplasia   . Diabetes mellitus without complication ([place])   . GERD (gastroesophageal reflux disease)   . Hyperlipidemia   . Hypertension   . Kidney cancer, primary, with metastasis from kidney to other site [facility])    Son  . MRSA infection    lung  . Thyroid disease     Patient Active Problem List   Diagnosis Date Noted  . Elevated WBC count 10/06/2017  . Lumbar radiculopathy 06/24/2017  . Waddling gait 03/19/2017  . Gait abnormality 02/26/2017  . Advanced care planning/counseling discussion 08/04/2016  . S/P total knee replacement using cement, left 04/01/2016  . Primary osteoarthritis of right knee 03/19/2016  . Hyperlipidemia 11/19/2014  . Diabetes mellitus without complication ([address]) 34/19/6222  . Nonspecific abnormal results of thyroid function study 07/18/2014  . Hypertension 07/18/2014  . Dysphagia 05/11/2013    Past Surgical History:  Procedure Laterality Date  . ABDOMINAL HYSTERECTOMY     complete  . achalasia    . APPENDECTOMY    . HIATAL HERNIA REPAIR    . KNEE ARTHROPLASTY Right 04/01/2016   Procedure: COMPUTER ASSISTED TOTAL KNEE ARTHROPLASTY;  Surgeon: Dereck Leep, MD;  Location: [facility] ORS;  Service: Orthopedics;  Laterality: Right;  . KNEE  ARTHROPLASTY Left 11/30/2016   Procedure: COMPUTER ASSISTED TOTAL KNEE ARTHROPLASTY;  Surgeon: Dereck Leep, MD;  Location: [facility] ORS;  Service: Orthopedics;  Laterality: Left;  . PARATHYROIDECTOMY    . THYROID EXPLORATION    . TONSILLECTOMY      Prior to Admission medications   Medication Sig Start Date End Date Taking? Authorizing Provider  Alpha-Lipoic Acid 200 MG TABS Take 200 mg by mouth 2 (two) times daily.     [provider]  Cholecalciferol (VITAMIN D-3) 5000 units TABS Take 5,000 Units daily with supper by mouth.     [provider]  docusate sodium (COLACE) 100 MG capsule Take 200 mg by mouth at bedtime.    [provider]  Fenofibric Acid 105 MG TABS Take 1 tablet (105 mg total) by mouth daily. 08/10/17   Guadalupe Maple, MD  glimepiride (AMARYL) 1 MG tablet Take 1 tablet (1 mg total) by mouth daily with breakfast. 08/10/17   Guadalupe Maple, MD  glucose blood (ONE TOUCH ULTRA TEST) test strip 2 (two) times daily. 07/23/16   [provider]  hydrocortisone (ANUSOL-HC) 2.5 % rectal cream Place 1 application rectally 2 (two) times daily. 10/21/17   Guadalupe Maple, MD  losartan-hydrochlorothiazide (HYZAAR) 100-25 MG tablet Take 1 tablet by mouth daily. 08/10/17   Guadalupe Maple, MD  Melatonin 1 MG CAPS Take 1 capsule at bedtime as needed by mouth.    [provider]  metoprolol succinate (TOPROL-XL) 100 MG 24 hr tablet Take 1 tablet (100 mg total) by mouth daily. Take with or immediately following a meal. 08/10/17   Crissman, Jeannette How, MD  Multiple Vitamin (MULTIVITAMINS PO) Take 1 packet by mouth 2 (two) times daily. Forward Gold Daily Regimen (multivitamin/bone&balance/omega 3) By Dr. Lanell Matar    [provider]  Probiotic Product (PROBIOTIC PO) Take 1 capsule by mouth daily before breakfast.    [provider]  PROCTO-MED HC 2.5 % rectal cream Place 1 application rectally 2 (two) times daily. 03/18/17   Volney American, PA-C    Allergies Procaine  Family History  Problem Relation Age of Onset  . Heart disease Mother   . Diabetes Father   . Lung cancer Sister   . Diabetes Sister   . Stomach cancer Brother   . CAD Brother   . Kidney cancer Son     Social History Social History   Tobacco Use  . Smoking status: Former Smoker    Packs/day: 0.25    Years: 1.00    Pack years: 0.25    Last attempt to quit: 10/13/1962    Years since quitting: 55.0  . Smokeless tobacco: Never Used  . Tobacco comment: smoked one pack a week for 1 year - 55 years ago  Substance Use Topics  . Alcohol use: No  . Drug use: No    Review of Systems  Constitutional: No fever/chills Eyes: No visual changes. ENT: No sore throat. Cardiovascular:chest pain. Respiratory: Denies shortness of breath. Gastrointestinal: No abdominal pain.  No nausea, no vomiting.  No diarrhea.  No constipation. Genitourinary: Negative for dysuria. Musculoskeletal: Negative for back pain. Skin: Negative for rash. Neurological: Negative for headaches, focal weakness   ____________________________________________   PHYSICAL EXAM:  VITAL SIGNS: ED Triage Vitals  Enc Vitals Group     BP 11/03/17 1504 (!) 125/45     Pulse Rate 11/03/17 1504 82     Resp 11/03/17 1504 14     Temp 11/03/17 1504 98.2 F (36.8 C)     Temp Source 11/03/17 1504 Oral     SpO2 11/03/17 1504 98 %     Weight 11/03/17 1501 114 lb (51.7 kg)     Height 11/03/17 1501 5\' 3"  (1.6 m)     Head Circumference --      Peak Flow --      Pain Score 11/03/17 1501 4     Pain Loc --      Pain Edu? --      Excl. in [place]? --     Constitutional: Alert and oriented. Well appearing and in no acute distress. Eyes: Conjunctivae are normal.  Head: Atraumatic. Nose: No congestion/rhinnorhea. Mouth/Throat: Mucous membranes are moist.  Oropharynx non-erythematous. Neck: No stridor. Cardiovascular: Normal rate, regular rhythm. Grossly normal heart sounds.  Good  peripheral circulation. Respiratory: Normal respiratory effort.  No retractions. Lungs CTAB. Gastrointestinal: Soft and nontender. No distention. No abdominal bruits. No CVA tenderness. Musculoskeletal: No lower extremity tenderness nor edema.   Neurologic:  Normal speech and language. No gross focal neurologic deficits are appreciated. No gait instability. Skin:  Skin is warm, dry and intact. No rash noted.   ____________________________________________   LABS (all labs ordered are listed, but only abnormal results are displayed)  Labs Reviewed  BASIC METABOLIC PANEL - Abnormal; Notable for the following components:      Result Value   Sodium 134 (*)    Glucose, Bld 305 (*)  BUN 25 (*)    Creatinine, Ser 1.26 (*)    Calcium 8.8 (*)    GFR calc non Af Amer 37 (*)    GFR calc Af Amer 43 (*)    All other components within normal limits  CBC - Abnormal; Notable for the following components:   WBC 31.6 (*)    RBC 3.47 (*)    Hemoglobin 10.2 (*)    HCT 32.5 (*)    All other components within normal limits  CULTURE, BLOOD (ROUTINE X 2)  CULTURE, BLOOD (ROUTINE X 2)  TROPONIN I  COMPREHENSIVE METABOLIC PANEL  URINALYSIS, ROUTINE W REFLEX MICROSCOPIC  FIBRIN DERIVATIVES D-DIMER ([facility] ONLY)  I-STAT CG4 LACTIC ACID, ED  I-STAT CG4 LACTIC ACID, ED   ____________________________________________  EKG  EKG read and interpreted by me shows normal sinus rhythm rate of 84 normal axis no acute ST-T wave changes ____________________________________________  RADIOLOGY  ED MD interpretation: X-ray read by radiology reviewed by me shows a small right pleural effusion and right upper lobe infiltrative changes.  Is probably is a pneumonia in view of patient's lab work and history.  Official radiology report(s): Dg Chest 2 View  Result Date: 11/03/2017 CLINICAL DATA:  Right-sided chest pain for 2 days EXAM: CHEST - 2 VIEW COMPARISON:  08/30/15 FINDINGS: Cardiac shadow is within normal  limits. Patchy infiltrative changes are noted in the right upper lobe with volume loss which may contribute to the patient's underlying symptomatology. Small right pleural effusion is noted. The lungs are hyperinflated. Degenerative changes of the thoracic spine are noted. IMPRESSION: Small right pleural effusion as well as right upper lobe infiltrative changes these likely account for the patient's underlying chest pain. Followup PA and lateral chest X-ray is recommended in 3-4 weeks following trial of antibiotic therapy to ensure resolution and exclude underlying malignancy. Electronically Signed   By: Inez Catalina M.D.   On: 11/03/2017 15:51    ____________________________________________   PROCEDURES  Procedure(s) performed:   Procedures  Critical Care performed:   ____________________________________________   INITIAL IMPRESSION / ASSESSMENT AND PLAN / ED COURSE  Patient with markedly elevated white count and infiltrate on chest x-ray and symptoms consistent with pleuritic chest pain as though she has a pneumonia which goes along with the chest x-ray and a cough I will plan on getting her in the hospital        ____________________________________________   FINAL CLINICAL IMPRESSION(S) / ED DIAGNOSES  Final diagnoses:  Community acquired pneumonia, unspecified laterality     ED Discharge Orders    None       Note:  This document was prepared using Dragon voice recognition software and may include unintentional dictation errors.    Nena Polio, MD 11/03/17 [phone]

## 2017-11-03 NOTE — H&P (Signed)
[place] [facility] [place] NAME: Deanna Bird    MR#:  [id number]  DATE OF BIRTH:  1928/03/22  DATE OF ADMISSION:  11/03/2017  PRIMARY CARE PHYSICIAN: Guadalupe Maple, MD   REQUESTING/REFERRING PHYSICIAN: Dr. Rip Harbour  CHIEF COMPLAINT:   Chief Complaint  Patient presents with  . Chest Pain    HISTORY OF PRESENT ILLNESS:  Deanna Bird  is a 84 y.o. female with a known history of diabetes, Barrett's esophagus, hypertension, leukocytosis presents to the emergency room complaining of right-sided chest pain and dry cough.  Patient has been found to have right upper lobe pneumonia with a small right-sided effusion.  Patient has white count elevated at 31,000 and is being admitted to the hospital for IV antibiotics.  Patient has seen oncology Dr. Janese Banks recently for leukocytosis which was thought to be reactive.  Etiology was unclear.  Work-up underway. She is not needing oxygen.  Afebrile.  But has significant leukocytosis.  PAST MEDICAL HISTORY:   Past Medical History:  Diagnosis Date  . Achalasia   . Barrett's esophagus with dysplasia   . Diabetes mellitus without complication ([place])   . GERD (gastroesophageal reflux disease)   . Hyperlipidemia   . Hypertension   . Kidney cancer, primary, with metastasis from kidney to other site [facility])    Son  . MRSA infection    lung  . Thyroid disease     PAST SURGICAL HISTORY:   Past Surgical History:  Procedure Laterality Date  . ABDOMINAL HYSTERECTOMY     complete  . achalasia    . APPENDECTOMY    . HIATAL HERNIA REPAIR    . KNEE ARTHROPLASTY Right 04/01/2016   Procedure: COMPUTER ASSISTED TOTAL KNEE ARTHROPLASTY;  Surgeon: Dereck Leep, MD;  Location: [facility] ORS;  Service: Orthopedics;  Laterality: Right;  . KNEE ARTHROPLASTY Left 11/30/2016   Procedure: COMPUTER ASSISTED TOTAL KNEE ARTHROPLASTY;  Surgeon: Dereck Leep, MD;  Location: [facility] ORS;  Service: Orthopedics;  Laterality: Left;  .  PARATHYROIDECTOMY    . THYROID EXPLORATION    . TONSILLECTOMY      SOCIAL HISTORY:   Social History   Tobacco Use  . Smoking status: Former Smoker    Packs/day: 0.25    Years: 1.00    Pack years: 0.25    Last attempt to quit: 10/13/1962    Years since quitting: 55.0  . Smokeless tobacco: Never Used  . Tobacco comment: smoked one pack a week for 1 year - 55 years ago  Substance Use Topics  . Alcohol use: No    FAMILY HISTORY:   Family History  Problem Relation Age of Onset  . Heart disease Mother   . Diabetes Father   . Lung cancer Sister   . Diabetes Sister   . Stomach cancer Brother   . CAD Brother   . Kidney cancer Son     DRUG ALLERGIES:   Allergies  Allergen Reactions  . Procaine Palpitations and Other (See Comments)    Rapid heartbeat    REVIEW OF SYSTEMS:   Review of Systems  Constitutional: Positive for malaise/fatigue. Negative for chills, fever and weight loss.  HENT: Negative for hearing loss and nosebleeds.   Eyes: Negative for blurred vision, double vision and pain.  Respiratory: Positive for cough. Negative for hemoptysis, sputum production, shortness of breath and wheezing.   Cardiovascular: Positive for chest pain. Negative for palpitations, orthopnea and leg swelling.  Gastrointestinal: Negative for abdominal pain, constipation,  diarrhea, nausea and vomiting.  Genitourinary: Negative for dysuria and hematuria.  Musculoskeletal: Negative for back pain, falls and myalgias.  Skin: Negative for rash.  Neurological: Negative for dizziness, tremors, sensory change, speech change, focal weakness, seizures and headaches.  Endo/Heme/Allergies: Does not bruise/bleed easily.  Psychiatric/Behavioral: Negative for depression and memory loss. The patient is not nervous/anxious.     MEDICATIONS AT HOME:   Prior to Admission medications   Medication Sig Start Date End Date Taking? Authorizing Provider  Cholecalciferol (VITAMIN D-3) 5000 units TABS Take  5,000 Units daily with supper by mouth.    Yes [provider]  Fenofibric Acid 105 MG TABS Take 1 tablet (105 mg total) by mouth daily. 08/10/17  Yes Crissman, Jeannette How, MD  ferrous sulfate 325 (65 FE) MG tablet Take 325 mg by mouth 2 (two) times daily.   Yes [provider]  glucose blood (ONE TOUCH ULTRA TEST) test strip 1 each by Other route as directed.    Yes [provider]  hydrocortisone (ANUSOL-HC) 2.5 % rectal cream Place 1 application rectally 2 (two) times daily. 10/21/17  Yes Crissman, Jeannette How, MD  losartan-hydrochlorothiazide (HYZAAR) 100-25 MG tablet Take 1 tablet by mouth daily. 08/10/17  Yes Crissman, Jeannette How, MD  Melatonin 1 MG CAPS Take 1 capsule by mouth at bedtime as needed (sleep).    Yes [provider]  metoprolol succinate (TOPROL-XL) 100 MG 24 hr tablet Take 1 tablet (100 mg total) by mouth daily. Take with or immediately following a meal. 08/10/17  Yes Crissman, Jeannette How, MD  oxyCODONE (OXY IR/ROXICODONE) 5 MG immediate release tablet Take 5 mg by mouth every 4 (four) hours as needed for severe pain.   Yes [provider]  glimepiride (AMARYL) 1 MG tablet Take 1 tablet (1 mg total) by mouth daily with breakfast. Patient not taking: Reported on 11/03/2017 08/10/17   Guadalupe Maple, MD  PROCTO-MED [facility] 2.5 % rectal cream Place 1 application rectally 2 (two) times daily. Patient not taking: Reported on 11/03/2017 03/18/17   Volney American, PA-C     VITAL SIGNS:  Blood pressure (!) 125/45, pulse 82, temperature 98.2 F (36.8 C), temperature source Oral, resp. rate 14, height 5\' 3"  (1.6 m), weight 51.7 kg, SpO2 98 %.  PHYSICAL EXAMINATION:  Physical Exam  GENERAL:  84 y.o.-year-old patient lying in the bed with no acute distress.  EYES: Pupils equal, round, reactive to light and accommodation. No scleral icterus. Extraocular muscles intact.  HEENT: Head atraumatic, normocephalic. Oropharynx and nasopharynx clear. No oropharyngeal  erythema, moist oral mucosa  NECK:  Supple, no jugular venous distention. No thyroid enlargement, no tenderness.  LUNGS: Shallow breathing.  Left basal rhonchi CARDIOVASCULAR: S1, S2 normal. No murmurs, rubs, or gallops.  ABDOMEN: Soft, nontender, nondistended. Bowel sounds present. No organomegaly or mass.  EXTREMITIES: No pedal edema, cyanosis, or clubbing. + 2 pedal & radial pulses b/l.   NEUROLOGIC: Cranial nerves II through XII are intact. No focal Motor or sensory deficits appreciated b/l PSYCHIATRIC: The patient is alert and oriented x 3. Good affect.  SKIN: No obvious rash, lesion, or ulcer.   LABORATORY PANEL:   CBC Recent Labs  Lab 11/03/17 1503  WBC 31.6*  HGB 10.2*  HCT 32.5*  PLT 281   ------------------------------------------------------------------------------------------------------------------  Chemistries  Recent Labs  Lab 11/03/17 1503  NA 134*  K 4.0  CL 100  CO2 27  GLUCOSE 305*  BUN 25*  CREATININE 1.26*  CALCIUM 8.8*   ------------------------------------------------------------------------------------------------------------------  Cardiac Enzymes Recent Labs  Lab 11/03/17 1503  TROPONINI <0.03   ------------------------------------------------------------------------------------------------------------------  RADIOLOGY:  Dg Chest 2 View  Result Date: 11/03/2017 CLINICAL DATA:  Right-sided chest pain for 2 days EXAM: CHEST - 2 VIEW COMPARISON:  08/30/15 FINDINGS: Cardiac shadow is within normal limits. Patchy infiltrative changes are noted in the right upper lobe with volume loss which may contribute to the patient's underlying symptomatology. Small right pleural effusion is noted. The lungs are hyperinflated. Degenerative changes of the thoracic spine are noted. IMPRESSION: Small right pleural effusion as well as right upper lobe infiltrative changes these likely account for the patient's underlying chest pain. Followup PA and lateral chest  X-ray is recommended in 3-4 weeks following trial of antibiotic therapy to ensure resolution and exclude underlying malignancy. Electronically Signed   By: Inez Catalina M.D.   On: 11/03/2017 15:51     IMPRESSION AND PLAN:   *Right sided pneumonia.  Community-acquired No risk factors for aspiration found We will start IV ceftriaxone and azithromycin due to significantly elevated leukocytosis.  Her leukocytosis could also be this high due to baseline being higher than normal.  Cultures have been sent and pending.  Use oxygen as needed nebulizers as needed Repeat CBC in the morning  *Diabetes mellitus.  Sliding scale insulin added.  *Hypertension.  Continue home medications.  DVT prophylaxis with Lovenox  All the records are reviewed and case discussed with ED provider. Management plans discussed with the patient, family and they are in agreement.  CODE STATUS: DO NOT RESUSCITATE  TOTAL TIME TAKING CARE OF THIS PATIENT: 40 minutes.   Leia Alf Gar Glance M.D on 11/03/2017 at 5:44 PM  Between 7am to 6pm - Pager - [phone]  After 6pm go to [email or web address] - password [email or web address] [facility]  Office  [phone]  CC: Primary care physician; Guadalupe Maple, MD  Note: This dictation was prepared with Dragon dictation along with smaller phrase technology. Any transcriptional errors that result from this process are unintentional.

## 2017-11-03 NOTE — Progress Notes (Signed)
Anticoagulation monitoring(Lovenox):  84 yo female ordered Lovenox 40 mg Q24h  Filed Weights   11/03/17 1501  Weight: 114 lb (51.7 kg)   BMI    Lab Results  Component Value Date   CREATININE 1.18 (H) 11/03/2017   CREATININE 1.26 (H) 11/03/2017   CREATININE 1.17 (H) 08/10/2017   Estimated Creatinine Clearance: 27.4 mL/min (A) (by C-G formula based on SCr of 1.18 mg/dL (H)). Hemoglobin & Hematocrit     Component Value Date/Time   HGB 10.2 (L) 11/03/2017 1503   HGB 10.2 (L) 10/05/2017 1445   HCT 32.5 (L) 11/03/2017 1503   HCT 32.1 (L) 10/05/2017 1445     Per Protocol for Patient with estCrcl < 30 ml/min and BMI < 40, will transition to Lovenox 30 mg Q24h.

## 2017-11-04 ENCOUNTER — Ambulatory Visit: Payer: PPO | Admitting: Physical Therapy

## 2017-11-04 LAB — GLUCOSE, CAPILLARY: Glucose-Capillary: 126 mg/dL — ABNORMAL HIGH (ref 70–99)

## 2017-11-04 LAB — CBC
HCT: 28.9 % — ABNORMAL LOW (ref 36.0–46.0)
Hemoglobin: 9 g/dL — ABNORMAL LOW (ref 12.0–15.0)
MCH: 29.4 pg (ref 26.0–34.0)
MCHC: 31.1 g/dL (ref 30.0–36.0)
MCV: 94.4 fL (ref 80.0–100.0)
Platelets: 235 10*3/uL (ref 150–400)
RBC: 3.06 MIL/uL — ABNORMAL LOW (ref 3.87–5.11)
RDW: 13.7 % (ref 11.5–15.5)
WBC: 22.1 10*3/uL — AB (ref 4.0–10.5)
nRBC: 0 % (ref 0.0–0.2)

## 2017-11-04 LAB — COMP PANEL: LEUKEMIA/LYMPHOMA

## 2017-11-04 LAB — BASIC METABOLIC PANEL
Anion gap: 7 (ref 5–15)
BUN: 24 mg/dL — ABNORMAL HIGH (ref 8–23)
CALCIUM: 8.3 mg/dL — AB (ref 8.9–10.3)
CO2: 27 mmol/L (ref 22–32)
CREATININE: 1.08 mg/dL — AB (ref 0.44–1.00)
Chloride: 104 mmol/L (ref 98–111)
GFR calc non Af Amer: 45 mL/min — ABNORMAL LOW (ref >60)
GFR, EST AFRICAN AMERICAN: 52 mL/min — AB (ref >60)
Glucose, Bld: 128 mg/dL — ABNORMAL HIGH (ref 70–99)
Potassium: 3.5 mmol/L (ref 3.5–5.1)
SODIUM: 138 mmol/L (ref 135–145)

## 2017-11-04 LAB — JAK2 GENOTYPR

## 2017-11-04 MED ORDER — SODIUM CHLORIDE 0.9 % IV SOLN
3.0000 g | Freq: Two times a day (BID) | INTRAVENOUS | Status: DC
  Administered 2017-11-04 – 2017-11-05 (×2): 3 g via INTRAVENOUS
  Filled 2017-11-04 (×4): qty 3

## 2017-11-04 NOTE — Progress Notes (Signed)
[place] [facility] [place] NAME: Deanna Bird    MR#:  [id number]  DATE OF BIRTH:  11-Dec-1928  SUBJECTIVE:  CHIEF COMPLAINT:   Chief Complaint  Patient presents with  . Chest Pain   Right-sided chest pain is improved today.  Mild cough.  Afebrile.  REVIEW OF SYSTEMS:    Review of Systems  Constitutional: Positive for malaise/fatigue. Negative for chills and fever.  HENT: Negative for sore throat.   Eyes: Negative for blurred vision, double vision and pain.  Respiratory: Positive for cough. Negative for hemoptysis, shortness of breath and wheezing.   Cardiovascular: Positive for chest pain. Negative for palpitations, orthopnea and leg swelling.  Gastrointestinal: Negative for abdominal pain, constipation, diarrhea, heartburn, nausea and vomiting.  Genitourinary: Negative for dysuria and hematuria.  Musculoskeletal: Negative for back pain and joint pain.  Skin: Negative for rash.  Neurological: Negative for sensory change, speech change, focal weakness and headaches.  Endo/Heme/Allergies: Does not bruise/bleed easily.  Psychiatric/Behavioral: Negative for depression. The patient is not nervous/anxious.     DRUG ALLERGIES:   Allergies  Allergen Reactions  . Procaine Palpitations and Other (See Comments)    Rapid heartbeat    VITALS:  Blood pressure (!) 123/58, pulse 71, temperature 98.2 F (36.8 C), temperature source Oral, resp. rate (!) 22, height 5\' 3"  (1.6 m), weight 50.3 kg, SpO2 99 %.  PHYSICAL EXAMINATION:   Physical Exam  GENERAL:  84 y.o.-year-old patient lying in the bed with no acute distress.  EYES: Pupils equal, round, reactive to light and accommodation. No scleral icterus. Extraocular muscles intact.  HEENT: Head atraumatic, normocephalic. Oropharynx and nasopharynx clear.  NECK:  Supple, no jugular venous distention. No thyroid enlargement, no tenderness.  LUNGS: Normal breath sounds bilaterally, no wheezing, rales,  rhonchi. No use of accessory muscles of respiration.  CARDIOVASCULAR: S1, S2 normal. No murmurs, rubs, or gallops.  ABDOMEN: Soft, nontender, nondistended. Bowel sounds present. No organomegaly or mass.  EXTREMITIES: No cyanosis, clubbing or edema b/l.    NEUROLOGIC: Cranial nerves II through XII are intact. No focal Motor or sensory deficits b/l.   PSYCHIATRIC: The patient is alert and oriented x 3.  SKIN: No obvious rash, lesion, or ulcer.   LABORATORY PANEL:   CBC Recent Labs  Lab 11/04/17 0545  WBC 22.1*  HGB 9.0*  HCT 28.9*  PLT 235   ------------------------------------------------------------------------------------------------------------------ Chemistries  Recent Labs  Lab 11/03/17 1706 11/04/17 0545  NA 136 138  K 4.1 3.5  CL 99 104  CO2 29 27  GLUCOSE 222* 128*  BUN 25* 24*  CREATININE 1.18* 1.08*  CALCIUM 9.3 8.3*  AST 22  --   ALT 15  --   ALKPHOS 56  --   BILITOT 0.9  --    ------------------------------------------------------------------------------------------------------------------  Cardiac Enzymes Recent Labs  Lab 11/03/17 1503  TROPONINI <0.03   ------------------------------------------------------------------------------------------------------------------  RADIOLOGY:  Dg Chest 2 View  Result Date: 11/03/2017 CLINICAL DATA:  Right-sided chest pain for 2 days EXAM: CHEST - 2 VIEW COMPARISON:  08/30/15 FINDINGS: Cardiac shadow is within normal limits. Patchy infiltrative changes are noted in the right upper lobe with volume loss which may contribute to the patient's underlying symptomatology. Small right pleural effusion is noted. The lungs are hyperinflated. Degenerative changes of the thoracic spine are noted. IMPRESSION: Small right pleural effusion as well as right upper lobe infiltrative changes these likely account for the patient's underlying chest pain. Followup PA and lateral chest X-ray is recommended  in 3-4 weeks following trial of  antibiotic therapy to ensure resolution and exclude underlying malignancy. Electronically Signed   By: Inez Catalina M.D.   On: 11/03/2017 15:51     ASSESSMENT AND PLAN:   *Right-sided pneumonia. Patient had surgery for Achalasia cardia at [facility].  He still of aspiration pneumonia.  No trouble swallowing at this time. Could have chronic reflux and definite concern for this being implicated and pneumonia due to elevated leukocytosis for past few weeks. WBC trending down but continues to be high at 22,000 today.  We will continue IV antibiotics. We will change antibiotics to Unasyn.  Can go home on Augmentin to cover for anaerobes due to concern for aspiration Cultures negative Likely discharge home tomorrow  *Diabetes mellitus.  On sliding scale insulin  *Hypertension.  Continue home medications In normal range  All the records are reviewed and case discussed with Care Management/Social Worker Management plans discussed with the patient, family and they are in agreement.  CODE STATUS: DNR  DVT Prophylaxis: SCDs  TOTAL TIME TAKING CARE OF THIS PATIENT: 35 minutes.   POSSIBLE D/C IN 1-2 DAYS, DEPENDING ON CLINICAL CONDITION.  Leia Alf Tiani Stanbery M.D on 11/04/2017 at 9:34 AM  Between 7am to 6pm - Pager - [phone]  After 6pm go to [email or web address] - password [email or web address] [facility]  Office  [phone]  CC: Primary care physician; Guadalupe Maple, MD  Note: This dictation was prepared with Dragon dictation along with smaller phrase technology. Any transcriptional errors that result from this process are unintentional.

## 2017-11-04 NOTE — Progress Notes (Signed)
Pharmacy Antibiotic Note  CERENITI CURB is a 84 y.o. female admitted on 11/03/2017 with pneumonia.  Pharmacy has been consulted for Unasyn dosing.  Plan: Will start Unasyn 3 g q12H for aspiration PNA.  Height: 5\' 3"  (160 cm) Weight: 110 lb 14.3 oz (50.3 kg) IBW/kg (Calculated) : 52.4  Temp (24hrs), Avg:98.2 F (36.8 C), Min:98.2 F (36.8 C), Max:98.3 F (36.8 C)  Recent Labs  Lab 10/29/17 1517 11/03/17 1503 11/03/17 1706 11/03/17 1834 11/03/17 2017 11/03/17 2319 11/04/17 0545  WBC 14.7* 31.6*  --   --   --   --  22.1*  CREATININE  --  1.26* 1.18*  --   --   --  1.08*  LATICACIDVEN  --   --   --  1.9 2.7* 1.1  --     Estimated Creatinine Clearance: 29.1 mL/min (A) (by C-G formula based on SCr of 1.08 mg/dL (H)).    Allergies  Allergen Reactions  . Procaine Palpitations and Other (See Comments)    Rapid heartbeat    Antimicrobials this admission: 10/30 azithro >> 10/31 10/30 ceftriaxone >> 10/31 10/30 cefepime x 1 dose  10/31 Unasyn >>   Dose adjustments this admission: Renally adjusted.   Microbiology results: 10/30 BCx: Pending  Thank you for allowing pharmacy to be a part of this patient's care.  Oswald Hillock, PharmD 11/04/2017 10:35 AM

## 2017-11-05 ENCOUNTER — Telehealth: Payer: Self-pay

## 2017-11-05 LAB — CBC WITH DIFFERENTIAL/PLATELET
Abs Immature Granulocytes: 0.2 10*3/uL — ABNORMAL HIGH (ref 0.00–0.07)
Basophils Absolute: 0 10*3/uL (ref 0.0–0.1)
Basophils Relative: 0 %
Eosinophils Absolute: 0 10*3/uL (ref 0.0–0.5)
Eosinophils Relative: 0 %
HCT: 29.7 % — ABNORMAL LOW (ref 36.0–46.0)
Hemoglobin: 9.2 g/dL — ABNORMAL LOW (ref 12.0–15.0)
Immature Granulocytes: 1 %
Lymphocytes Relative: 6 %
Lymphs Abs: 1 10*3/uL (ref 0.7–4.0)
MCH: 28.8 pg (ref 26.0–34.0)
MCHC: 31 g/dL (ref 30.0–36.0)
MCV: 92.8 fL (ref 80.0–100.0)
Monocytes Absolute: 0.8 10*3/uL (ref 0.1–1.0)
Monocytes Relative: 5 %
Neutro Abs: 15.3 10*3/uL — ABNORMAL HIGH (ref 1.7–7.7)
Neutrophils Relative %: 88 %
Platelets: 257 10*3/uL (ref 150–400)
RBC: 3.2 MIL/uL — ABNORMAL LOW (ref 3.87–5.11)
RDW: 13.5 % (ref 11.5–15.5)
WBC: 17.4 10*3/uL — ABNORMAL HIGH (ref 4.0–10.5)
nRBC: 0 % (ref 0.0–0.2)

## 2017-11-05 MED ORDER — AMOXICILLIN-POT CLAVULANATE 875-125 MG PO TABS: 1.0000 | ORAL_TABLET | Freq: Two times a day (BID) | ORAL | 0 refills | Status: DC

## 2017-11-05 NOTE — Progress Notes (Signed)
Pharmacy Antibiotic Note  Deanna Bird is a 84 y.o. female admitted on 11/03/2017 with pneumonia.  Pharmacy has been consulted for Unasyn dosing.  Plan: Will continue Unasyn 3 g q12H for aspiration PNA.  Height: 5\' 3"  (160 cm) Weight: 110 lb 14.3 oz (50.3 kg) IBW/kg (Calculated) : 52.4  Temp (24hrs), Avg:98.9 F (37.2 C), Min:97.9 F (36.6 C), Max:100.9 F (38.3 C)  Recent Labs  Lab 10/29/17 1517 11/03/17 1503 11/03/17 1706 11/03/17 1834 11/03/17 2017 11/03/17 2319 11/04/17 0545 11/05/17 0321  WBC 14.7* 31.6*  --   --   --   --  22.1* 17.4*  CREATININE  --  1.26* 1.18*  --   --   --  1.08*  --   LATICACIDVEN  --   --   --  1.9 2.7* 1.1  --   --     Estimated Creatinine Clearance: 29.1 mL/min (A) (by C-G formula based on SCr of 1.08 mg/dL (H)).    Allergies  Allergen Reactions  . Procaine Palpitations and Other (See Comments)    Rapid heartbeat    Antimicrobials this admission: 10/30 azithro >> 10/31 10/30 ceftriaxone >> 10/31 10/30 cefepime x 1 dose  10/31 Unasyn >>   Dose adjustments this admission: Renally adjusted. CrCL < 30.    Microbiology results: 10/30 BCx: Pending  Thank you for allowing pharmacy to be a part of this patient's care.  Oswald Hillock, PharmD 11/05/2017 9:22 AM

## 2017-11-05 NOTE — Telephone Encounter (Signed)
Transition Care Management Follow-up Telephone Call  Date of discharge and from where: [facility] on 11/05/17.  How have you been since you were released from the hospital? Doing better. Still coughing but has no sputum. Has occasional pain in the chest with coughing. Declines fever, SOB, dizziness or n/v/d.  Any questions or concerns? No   Items Reviewed:  Did the pt receive and understand the discharge instructions provided? Yes   Medications obtained and verified? Reviewed abx dispensed at d/c. Will review the rest of the med list at [facility] apt.  Any new allergies since your discharge? No   Dietary orders reviewed? Yes  Do you have support at home? Yes   Other (ie: DME, Home Health, etc) N/A  Functional Questionnaire: (I = Independent and D = Dependent)  Bathing/Dressing- I   Meal Prep- I  Eating- I  Maintaining continence- I  Transferring/Ambulation- Uses a cane for walking.  Managing Meds- I   Follow up appointments reviewed:    PCP Hospital f/u appt confirmed? Pt to call office on Monday 11/08/17, to schedule HFU apt.  [facility] f/u appt confirmed? N/A  Are transportation arrangements needed? No   If their condition worsens, is the pt aware to call  their PCP or go to the ED? Yes  Was the patient provided with contact information for the PCP's office or ED? Yes  Was the pt encouraged to call back with questions or concerns? Yes

## 2017-11-05 NOTE — Plan of Care (Signed)

## 2017-11-08 DIAGNOSIS — L570 Actinic keratosis: Secondary | ICD-10-CM | POA: Diagnosis not present

## 2017-11-08 LAB — CULTURE, BLOOD (ROUTINE X 2)
CULTURE: NO GROWTH
CULTURE: NO GROWTH

## 2017-11-08 NOTE — Discharge Summary (Signed)
[place] [facility] [place] NAME: Deanna Bird    MR#:  [id number]  DATE OF BIRTH:  January 23, 1928  DATE OF ADMISSION:  11/03/2017   ADMITTING PHYSICIAN: Hillary Bow, MD  DATE OF DISCHARGE: 11/05/2017 12:54 PM  PRIMARY CARE PHYSICIAN: Guadalupe Maple, MD   ADMISSION DIAGNOSIS:   Community acquired pneumonia, unspecified laterality [J18.9]  DISCHARGE DIAGNOSIS:   Active Problems:   Pneumonia   SECONDARY DIAGNOSIS:   Past Medical History:  Diagnosis Date  . Achalasia   . Barrett's esophagus with dysplasia   . Diabetes mellitus without complication ([place])   . GERD (gastroesophageal reflux disease)   . Hyperlipidemia   . Hypertension   . Kidney cancer, primary, with metastasis from kidney to other site [facility])    Son  . MRSA infection    lung  . Thyroid disease     HOSPITAL COURSE:   84 year old female with past medical history significant for hypertension, hypothyroidism, Barrett's esophagus, achalasia, GERD presents to hospital secondary to cough and right-sided pleuritic chest pain  1.  Right lower lobe pneumonia with pleurisy-de-escalate. -Patient denies any aspiration.  Cultures are negative -She was treated with IV Rocephin and azithromycin -Discharged on oral Augmentin.  She has chronic leukocytosis, WBC was higher than baseline which started improving with IV antibiotics. -Not using any supplemental oxygen.  Pleuritic chest pain is much improved by the time of discharge.  2.  Hypertension-on metoprolol, losartan hydrochlorothiazide  3.  Diabetes mellitus-off glimepiride due to low sugars.  Sugars have been well controlled in the hospital.  Continue to check fingersticks at home.  4.  Achalasia and Barrett's esophagus-continue to follow-up with GI.  Not symptomatic in the hospital  -Ambulatory in the hospital.  Will be discharged home  DISCHARGE CONDITIONS:   Guarded  CONSULTS OBTAINED:   None  DRUG ALLERGIES:    Allergies  Allergen Reactions  . Procaine Palpitations and Other (See Comments)    Rapid heartbeat   DISCHARGE MEDICATIONS:   Allergies as of 11/05/2017      Reactions   Procaine Palpitations, Other (See Comments)   Rapid heartbeat      Medication List    STOP taking these medications   glimepiride 1 MG tablet Commonly known as:  AMARYL     TAKE these medications   amoxicillin-clavulanate 875-125 MG tablet Commonly known as:  AUGMENTIN Take 1 tablet by mouth 2 (two) times daily for 7 days.   Fenofibric Acid 105 MG Tabs Take 1 tablet (105 mg total) by mouth daily.   ferrous sulfate 325 (65 FE) MG tablet Take 325 mg by mouth 2 (two) times daily.   hydrocortisone 2.5 % rectal cream Commonly known as:  ANUSOL-HC Place 1 application rectally 2 (two) times daily. What changed:  Another medication with the same name was removed. Continue taking this medication, and follow the directions you see here.   losartan-hydrochlorothiazide 100-25 MG tablet Commonly known as:  HYZAAR Take 1 tablet by mouth daily.   Melatonin 1 MG Caps Take 1 capsule by mouth at bedtime as needed (sleep).   metoprolol succinate 100 MG 24 hr tablet Commonly known as:  TOPROL-XL Take 1 tablet (100 mg total) by mouth daily. Take with or immediately following a meal.   ONE TOUCH ULTRA TEST test strip Generic drug:  glucose blood 1 each by Other route as directed.   oxyCODONE 5 MG immediate release tablet Commonly known as:  Oxy IR/ROXICODONE Take 5 mg  by mouth every 4 (four) hours as needed for severe pain.   Vitamin D-3 5000 units Tabs Take 5,000 Units daily with supper by mouth.        DISCHARGE INSTRUCTIONS:   1.  PCP follow-up in 1 to 2 weeks  DIET:   Cardiac diet  ACTIVITY:   Activity as tolerated  OXYGEN:   Home Oxygen: No.  Oxygen Delivery: room air  DISCHARGE LOCATION:   home   If you experience worsening of your admission symptoms, develop shortness of breath,  life threatening emergency, suicidal or homicidal thoughts you must seek medical attention immediately by calling 911 or calling your MD immediately  if symptoms less severe.  You Must read complete instructions/literature along with all the possible adverse reactions/side effects for all the Medicines you take and that have been prescribed to you. Take any new Medicines after you have completely understood and accpet all the possible adverse reactions/side effects.   Please note  You were cared for by a hospitalist during your hospital stay. If you have any questions about your discharge medications or the care you received while you were in the hospital after you are discharged, you can call the unit and asked to speak with the hospitalist on call if the hospitalist that took care of you is not available. Once you are discharged, your primary care physician will handle any further medical issues. Please note that NO REFILLS for any discharge medications will be authorized once you are discharged, as it is imperative that you return to your primary care physician (or establish a relationship with a primary care physician if you do not have one) for your aftercare needs so that they can reassess your need for medications and monitor your lab values.    On the day of Discharge:  VITAL SIGNS:   Blood pressure (!) 135/48, pulse 91, temperature 98.3 F (36.8 C), temperature source Oral, resp. rate (!) 23, height 5\' 3"  (1.6 m), weight 50.3 kg, SpO2 93 %.  PHYSICAL EXAMINATION:    GENERAL:  84 y.o.-year-old elderly patient lying in the bed with no acute distress.  EYES: Pupils equal, round, reactive to light and accommodation. No scleral icterus. Extraocular muscles intact.  HEENT: Head atraumatic, normocephalic. Oropharynx and nasopharynx clear.  NECK:  Supple, no jugular venous distention. No thyroid enlargement, no tenderness.  LUNGS: Normal breath sounds bilaterally, no wheezing, rales,rhonchi  or crepitation. No use of accessory muscles of respiration. Decreased bibasilar breath sounds CARDIOVASCULAR: S1, S2 normal. No  rubs, or gallops. 3/6 systolic murmur present ABDOMEN: Soft, non-tender, non-distended. Bowel sounds present. No organomegaly or mass.  EXTREMITIES: No pedal edema, cyanosis, or clubbing.  NEUROLOGIC: Cranial nerves II through XII are intact. Muscle strength 5/5 in all extremities. Sensation intact. Gait not checked.  PSYCHIATRIC: The patient is alert and oriented x 3.  SKIN: No obvious rash, lesion, or ulcer.   DATA REVIEW:   CBC Recent Labs  Lab 11/05/17 0321  WBC 17.4*  HGB 9.2*  HCT 29.7*  PLT 257    Chemistries  Recent Labs  Lab 11/03/17 1706 11/04/17 0545  NA 136 138  K 4.1 3.5  CL 99 104  CO2 29 27  GLUCOSE 222* 128*  BUN 25* 24*  CREATININE 1.18* 1.08*  CALCIUM 9.3 8.3*  AST 22  --   ALT 15  --   ALKPHOS 56  --   BILITOT 0.9  --      Microbiology Results  Results for orders placed  or performed during the hospital encounter of 11/03/17  Blood Culture (routine x 2)     Status: None   Collection Time: 11/03/17  5:06 PM  Result Value Ref Range Status   Specimen Description BLOOD BLOOD RIGHT FOREARM  Final   Special Requests   Final    BOTTLES DRAWN AEROBIC AND ANAEROBIC Blood Culture results may not be optimal due to an excessive volume of blood received in culture bottles   Culture   Final    NO GROWTH 5 DAYS Performed at [facility] [address] [place], [place] [address]    Report Status 11/08/2017 FINAL  Final  Blood Culture (routine x 2)     Status: None   Collection Time: 11/03/17  5:11 PM  Result Value Ref Range Status   Specimen Description BLOOD BLOOD LEFT FOREARM  Final   Special Requests   Final    BOTTLES DRAWN AEROBIC AND ANAEROBIC Blood Culture results may not be optimal due to an excessive volume of blood received in culture bottles   Culture   Final    NO GROWTH 5 DAYS Performed at [facility], [address], [place], [place] [address]    Report Status 11/08/2017 FINAL  Final    RADIOLOGY:  No results found.   Management plans discussed with the patient, family and they are in agreement.  CODE STATUS:  Code Status History    Date Active Date Inactive Code Status Order ID Comments User Context   11/03/2017 1743 11/05/2017 1619 DNR [id number]  Hillary Bow, MD ED   11/30/2016 1617 12/03/2016 1444 Full Code [id number]  Dereck Leep, MD Inpatient   04/01/2016 1727 04/03/2016 1428 Full Code [id number]  Dereck Leep, MD Inpatient    Questions for Most Recent Historical Code Status (Order [id number])    Question Answer Comment   In the event of cardiac or respiratory ARREST Do not call a "code blue"    In the event of cardiac or respiratory ARREST Do not perform Intubation, CPR, defibrillation or ACLS    In the event of cardiac or respiratory ARREST Use medication by any route, position, wound care, and other measures to relive pain and suffering. May use oxygen, suction and manual treatment of airway obstruction as needed for comfort.         Advance Directive Documentation     Most Recent Value  Type of Advance Directive  Living will  Pre-existing out of facility DNR order (yellow form or pink MOST form)  -  "MOST" Form in Place?  -      TOTAL TIME TAKING CARE OF THIS PATIENT: 38 minutes.    Gladstone Lighter M.D on 11/08/2017 at 1:57 PM  Between 7am to 6pm - Pager - [phone]  After 6pm go to [email or web address] - Proofreader  [facility]  Office  [phone]  CC: Primary care physician; Guadalupe Maple, MD   Note: This dictation was prepared with Dragon dictation along with smaller phrase technology. Any transcriptional errors that result from this process are unintentional.

## 2017-11-09 ENCOUNTER — Other Ambulatory Visit: Payer: Self-pay

## 2017-11-09 ENCOUNTER — Ambulatory Visit: Payer: PPO | Admitting: Physical Therapy

## 2017-11-09 NOTE — Patient Outreach (Signed)
[facility]) Care Management  [place]   11/09/2017  Deanna Bird February 01, 1928 [id number]   84 year old female outreached by [facility] services for a 30 day post discharge medication review.  PMHx includes, but not limited to, hypertension, pneumonia,, dysphagia, diabetes mellitus, osteoarthritis and hyperlipidemia.  Successful outreach to Deanna Bird.  HIPAA identifiers verified.   Subjective: Deanna Bird reports that that she is 'feeling better everyday."  She states that she is taking Augmentin and is not having diarrhea.  Patient reports that she is still taking glimeperide 0.5 mg daily prn when she eats a heavy meal. She reports that she does not have hypolycemic events and that she does not check her glucose everyday.  Objective:  HgA1c 5.5% on 08/10/17 SCr 1.08 on 11/04/17  Current Medications: Current Outpatient Medications  Medication Sig Dispense Refill  . amoxicillin-clavulanate (AUGMENTIN) 875-125 MG tablet Take 1 tablet by mouth 2 (two) times daily for 7 days. 14 tablet 0  . Cholecalciferol 10000 units TABS Take 1 tablet by mouth daily before supper.    . Fenofibric Acid 105 MG TABS Take 1 tablet (105 mg total) by mouth daily. 30 tablet 12  . ferrous sulfate 325 (65 FE) MG tablet Take 325 mg by mouth 2 (two) times daily.    Marland Kitchen glucose blood (ONE TOUCH ULTRA TEST) test strip 1 each by Other route as directed.     . hydrocortisone (ANUSOL-HC) 2.5 % rectal cream Place 1 application rectally 2 (two) times daily. 30 g 0  . losartan-hydrochlorothiazide (HYZAAR) 100-25 MG tablet Take 1 tablet by mouth daily. 30 tablet 12  . Melatonin 1 MG CAPS Take 1 capsule by mouth at bedtime as needed (sleep).     . metoprolol succinate (TOPROL-XL) 100 MG 24 hr tablet Take 1 tablet (100 mg total) by mouth daily. Take with or immediately following a meal. 30 tablet 12  . oxyCODONE (OXY IR/ROXICODONE) 5 MG immediate release tablet Take 5 mg by mouth every 4 (four) hours as  needed for severe pain.     No current facility-administered medications for this visit.     Functional Status: In your present state of health, do you have any difficulty performing the following activities: 11/03/2017 07/21/2017  Hearing? N N  Vision? N N  Difficulty concentrating or making decisions? N N  Walking or climbing stairs? Y Y  Dressing or bathing? N N  Doing errands, shopping? N Y  Conservation officer, nature and eating ? - N  Using the Toilet? - N  In the past six months, have you accidently leaked urine? - N  Do you have problems with loss of bowel control? - N  Managing your Medications? - N  Managing your Finances? - N  Housekeeping or managing your Housekeeping? - N  Some recent data might be hidden    Fall/Depression Screening: Fall Risk  08/10/2017 07/21/2017 02/04/2017  Falls in the past year? Yes Yes No  Number falls in past yr: 2 or more 2 or more -  Injury with Fall? Yes Yes -  Risk Factor Category  High Fall Risk High Fall Risk -  Risk for fall due to : - History of fall(s) -  Follow up Falls evaluation completed Falls evaluation completed;Falls prevention discussed;Education provided -   PHQ 2/9 Scores 07/21/2017 02/04/2017 07/15/2016 11/19/2014  PHQ - 2 Score 2 0 0 1  PHQ- 9 Score 6 - - -   ASSESSMENT: Date Discharged from Hospital: 11/05/17 Date  Medication Reconciliation Performed: 11/09/2017  Medications Discontinued at Discharge:   glimepiride  New Medications at Discharge:   amoxicillin clavulanate  Patient was recently discharged from hospital and all medications have been reviewed   Drugs sorted by system:  Cardiovascular: fenofibric acid, losartan/HCTZ, metoprolol succinate   Topical: hydrocortisone cream  Pain: oxycodone  Vitamins/Minerals: cholecalciferol, ferrous sulfate  Infectious Diseases: amoxicillin clavulanate  Miscellaneous: melatonin   Other issues noted:  Patient states that she takes glimepiride 0.5 mg daily prn if she eats a  "heavy meal."  Informed her that this medication was discontinued when she left the hospital due to her low HgA1c.  She states she will discuss with her PCP during her appointment.   PLAN: Route note to PCP, Dr. Jeananne Rama.  Joetta Manners, PharmD Clinical Pharmacist [facility] [phone]

## 2017-11-10 LAB — BCR-ABL1 FISH
CELLS ANALYZED: 200
CELLS COUNTED: 200

## 2017-11-11 ENCOUNTER — Ambulatory Visit: Payer: PPO | Admitting: Physical Therapy

## 2017-11-11 ENCOUNTER — Encounter: Payer: Self-pay | Admitting: Family Medicine

## 2017-11-11 ENCOUNTER — Ambulatory Visit (INDEPENDENT_AMBULATORY_CARE_PROVIDER_SITE_OTHER): Payer: PPO | Admitting: Family Medicine

## 2017-11-11 DIAGNOSIS — J189 Pneumonia, unspecified organism: Secondary | ICD-10-CM | POA: Diagnosis not present

## 2017-11-11 DIAGNOSIS — R269 Unspecified abnormalities of gait and mobility: Secondary | ICD-10-CM | POA: Diagnosis not present

## 2017-11-11 DIAGNOSIS — D692 Other nonthrombocytopenic purpura: Secondary | ICD-10-CM | POA: Diagnosis not present

## 2017-11-11 NOTE — Assessment & Plan Note (Signed)
Uses cane.

## 2017-11-11 NOTE — Progress Notes (Signed)
BP 140/66 (BP Location: Left Arm, Patient Position: Sitting, Cuff Size: Normal)   Pulse 71   Ht 5' 3.5" (1.613 m)   Wt 121 lb 8 oz (55.1 kg)   SpO2 98%   BMI 21.19 kg/m    Subjective:    Patient ID: Deanna Bird, female    DOB: January 09, 1928, 84 y.o.   MRN: [id number]  HPI: Deanna Bird is a 84 y.o. female  Chief Complaint  Patient presents with  . Hospitalization Follow-up    Pneumonia  Follow-up for hospitalization from pneumonia patient accompanied by her husband. Patient's finishing up antibiotics has 2 more days left is been taking without problems or issues. Cough is very minimal at this point and patient feels she is recovering very well.  She is able to do her activities of daily living without problems.   Relevant past medical, surgical, family and social history reviewed and updated as indicated. Interim medical history since our last visit reviewed. Allergies and medications reviewed and updated.  Review of Systems  Constitutional: Negative.   Respiratory: Negative.   Cardiovascular: Negative.     Per HPI unless specifically indicated above     Objective:    BP 140/66 (BP Location: Left Arm, Patient Position: Sitting, Cuff Size: Normal)   Pulse 71   Ht 5' 3.5" (1.613 m)   Wt 121 lb 8 oz (55.1 kg)   SpO2 98%   BMI 21.19 kg/m   Wt Readings from Last 3 Encounters:  11/11/17 121 lb 8 oz (55.1 kg)  11/04/17 110 lb 14.3 oz (50.3 kg)  10/29/17 119 lb 4.8 oz (54.1 kg)    Physical Exam  Constitutional: She is oriented to person, place, and time. She appears well-developed and well-nourished.  HENT:  Head: Normocephalic and atraumatic.  Eyes: Conjunctivae and EOM are normal.  Neck: Normal range of motion.  Cardiovascular: Normal rate, regular rhythm and normal heart sounds.  Pulmonary/Chest: Effort normal and breath sounds normal.  Musculoskeletal: Normal range of motion.  Neurological: She is alert and oriented to person, place, and time.  Skin: No  erythema.  Psychiatric: She has a normal mood and affect. Her behavior is normal. Judgment and thought content normal.    Results for orders placed or performed during the hospital encounter of 11/03/17  Blood Culture (routine x 2)  Result Value Ref Range   Specimen Description BLOOD BLOOD RIGHT FOREARM    Special Requests      BOTTLES DRAWN AEROBIC AND ANAEROBIC Blood Culture results may not be optimal due to an excessive volume of blood received in culture bottles   Culture      NO GROWTH 5 DAYS Performed at [facility], [address] [place], [place] [address]    Report Status 11/08/2017 FINAL   Blood Culture (routine x 2)  Result Value Ref Range   Specimen Description BLOOD BLOOD LEFT FOREARM    Special Requests      BOTTLES DRAWN AEROBIC AND ANAEROBIC Blood Culture results may not be optimal due to an excessive volume of blood received in culture bottles   Culture      NO GROWTH 5 DAYS Performed at [facility], [address], [place], [place] [address]    Report Status 11/08/2017 FINAL   Basic metabolic panel  Result Value Ref Range   Sodium 134 (L) 135 - 145 mmol/L   Potassium 4.0 3.5 - 5.1 mmol/L   Chloride 100 98 - 111 mmol/L   CO2  27 22 - 32 mmol/L   Glucose, Bld 305 (H) 70 - 99 mg/dL   BUN 25 (H) 8 - 23 mg/dL   Creatinine, Ser 1.26 (H) 0.44 - 1.00 mg/dL   Calcium 8.8 (L) 8.9 - 10.3 mg/dL   GFR calc non Af Amer 37 (L) >60 mL/min   GFR calc Af Amer 43 (L) >60 mL/min   Anion gap 7 5 - 15  CBC  Result Value Ref Range   WBC 31.6 (H) 4.0 - 10.5 K/uL   RBC 3.47 (L) 3.87 - 5.11 MIL/uL   Hemoglobin 10.2 (L) 12.0 - 15.0 g/dL   HCT 32.5 (L) 36.0 - 46.0 %   MCV 93.7 80.0 - 100.0 fL   MCH 29.4 26.0 - 34.0 pg   MCHC 31.4 30.0 - 36.0 g/dL   RDW 13.6 11.5 - 15.5 %   Platelets 281 150 - 400 K/uL   nRBC 0.0 0.0 - 0.2 %  Troponin I  Result Value Ref Range   Troponin I <0.03 <0.03 ng/mL  Comprehensive metabolic panel  Result Value Ref Range    Sodium 136 135 - 145 mmol/L   Potassium 4.1 3.5 - 5.1 mmol/L   Chloride 99 98 - 111 mmol/L   CO2 29 22 - 32 mmol/L   Glucose, Bld 222 (H) 70 - 99 mg/dL   BUN 25 (H) 8 - 23 mg/dL   Creatinine, Ser 1.18 (H) 0.44 - 1.00 mg/dL   Calcium 9.3 8.9 - 10.3 mg/dL   Total Protein 6.9 6.5 - 8.1 g/dL   Albumin 2.7 (L) 3.5 - 5.0 g/dL   AST 22 15 - 41 U/L   ALT 15 0 - 44 U/L   Alkaline Phosphatase 56 38 - 126 U/L   Total Bilirubin 0.9 0.3 - 1.2 mg/dL   GFR calc non Af Amer 40 (L) >60 mL/min   GFR calc Af Amer 47 (L) >60 mL/min   Anion gap 8 5 - 15  Fibrin derivatives D-Dimer  Result Value Ref Range   Fibrin derivatives D-dimer ([facility]) 1,040.77 (H) 0.00 - 499.00 ng/mL (FEU)  CBC  Result Value Ref Range   WBC 22.1 (H) 4.0 - 10.5 K/uL   RBC 3.06 (L) 3.87 - 5.11 MIL/uL   Hemoglobin 9.0 (L) 12.0 - 15.0 g/dL   HCT 28.9 (L) 36.0 - 46.0 %   MCV 94.4 80.0 - 100.0 fL   MCH 29.4 26.0 - 34.0 pg   MCHC 31.1 30.0 - 36.0 g/dL   RDW 13.7 11.5 - 15.5 %   Platelets 235 150 - 400 K/uL   nRBC 0.0 0.0 - 0.2 %  Basic metabolic panel  Result Value Ref Range   Sodium 138 135 - 145 mmol/L   Potassium 3.5 3.5 - 5.1 mmol/L   Chloride 104 98 - 111 mmol/L   CO2 27 22 - 32 mmol/L   Glucose, Bld 128 (H) 70 - 99 mg/dL   BUN 24 (H) 8 - 23 mg/dL   Creatinine, Ser 1.08 (H) 0.44 - 1.00 mg/dL   Calcium 8.3 (L) 8.9 - 10.3 mg/dL   GFR calc non Af Amer 45 (L) >60 mL/min   GFR calc Af Amer 52 (L) >60 mL/min   Anion gap 7 5 - 15  Lactic acid, plasma  Result Value Ref Range   Lactic Acid, Venous 1.9 0.5 - 1.9 mmol/L  Lactic acid, plasma  Result Value Ref Range   Lactic Acid, Venous 2.7 (HH) 0.5 - 1.9 mmol/L  Lactic  acid, plasma  Result Value Ref Range   Lactic Acid, Venous 1.1 0.5 - 1.9 mmol/L  Glucose, capillary  Result Value Ref Range   Glucose-Capillary 155 (H) 70 - 99 mg/dL  Glucose, capillary  Result Value Ref Range   Glucose-Capillary 126 (H) 70 - 99 mg/dL  CBC with Differential/Platelet  Result Value Ref  Range   WBC 17.4 (H) 4.0 - 10.5 K/uL   RBC 3.20 (L) 3.87 - 5.11 MIL/uL   Hemoglobin 9.2 (L) 12.0 - 15.0 g/dL   HCT 29.7 (L) 36.0 - 46.0 %   MCV 92.8 80.0 - 100.0 fL   MCH 28.8 26.0 - 34.0 pg   MCHC 31.0 30.0 - 36.0 g/dL   RDW 13.5 11.5 - 15.5 %   Platelets 257 150 - 400 K/uL   nRBC 0.0 0.0 - 0.2 %   Neutrophils Relative % 88 %   Neutro Abs 15.3 (H) 1.7 - 7.7 K/uL   Lymphocytes Relative 6 %   Lymphs Abs 1.0 0.7 - 4.0 K/uL   Monocytes Relative 5 %   Monocytes Absolute 0.8 0.1 - 1.0 K/uL   Eosinophils Relative 0 %   Eosinophils Absolute 0.0 0.0 - 0.5 K/uL   Basophils Relative 0 %   Basophils Absolute 0.0 0.0 - 0.1 K/uL   Immature Granulocytes 1 %   Abs Immature Granulocytes 0.20 (H) 0.00 - 0.07 K/uL      Assessment & Plan:   Problem List Items Addressed This Visit      Cardiovascular and Mediastinum   Purpura senilis (HCC)    Stable         Respiratory   Pneumonia    Resolving with just 2 antibiotics left and good resolution patient tolerating medications well and returning to normal activities.        Other   Gait abnormality    Uses cane          Follow up plan: Return if symptoms worsen or fail to improve, for As scheduled.

## 2017-11-11 NOTE — Assessment & Plan Note (Signed)
Stable

## 2017-11-11 NOTE — Assessment & Plan Note (Signed)
Resolving with just 2 antibiotics left and good resolution patient tolerating medications well and returning to normal activities.

## 2017-11-16 ENCOUNTER — Ambulatory Visit: Payer: PPO | Admitting: Physical Therapy

## 2017-11-24 ENCOUNTER — Ambulatory Visit: Payer: PPO | Admitting: Physical Therapy

## 2017-11-29 ENCOUNTER — Ambulatory Visit: Payer: PPO | Admitting: Physical Therapy

## 2017-11-30 ENCOUNTER — Inpatient Hospital Stay: Payer: PPO | Attending: Oncology

## 2017-11-30 ENCOUNTER — Other Ambulatory Visit: Payer: Self-pay

## 2017-11-30 ENCOUNTER — Encounter: Payer: Self-pay | Admitting: Oncology

## 2017-11-30 ENCOUNTER — Inpatient Hospital Stay (HOSPITAL_BASED_OUTPATIENT_CLINIC_OR_DEPARTMENT_OTHER): Payer: PPO | Admitting: Oncology

## 2017-11-30 VITALS — BP 154/76 | HR 62 | Temp 98.6°F | Resp 12 | Ht 63.5 in | Wt 122.4 lb

## 2017-11-30 DIAGNOSIS — E785 Hyperlipidemia, unspecified: Secondary | ICD-10-CM | POA: Diagnosis not present

## 2017-11-30 DIAGNOSIS — D72829 Elevated white blood cell count, unspecified: Secondary | ICD-10-CM | POA: Insufficient documentation

## 2017-11-30 DIAGNOSIS — D509 Iron deficiency anemia, unspecified: Secondary | ICD-10-CM | POA: Diagnosis not present

## 2017-11-30 DIAGNOSIS — E119 Type 2 diabetes mellitus without complications: Secondary | ICD-10-CM

## 2017-11-30 DIAGNOSIS — D649 Anemia, unspecified: Secondary | ICD-10-CM

## 2017-11-30 DIAGNOSIS — I10 Essential (primary) hypertension: Secondary | ICD-10-CM | POA: Diagnosis not present

## 2017-11-30 LAB — CBC WITH DIFFERENTIAL/PLATELET
Abs Immature Granulocytes: 0.03 10*3/uL (ref 0.00–0.07)
Basophils Absolute: 0 10*3/uL (ref 0.0–0.1)
Basophils Relative: 0 %
EOS ABS: 0.1 10*3/uL (ref 0.0–0.5)
EOS PCT: 1 %
HEMATOCRIT: 31.5 % — AB (ref 36.0–46.0)
Hemoglobin: 9.9 g/dL — ABNORMAL LOW (ref 12.0–15.0)
IMMATURE GRANULOCYTES: 0 %
Lymphocytes Relative: 17 %
Lymphs Abs: 1.6 10*3/uL (ref 0.7–4.0)
MCH: 29.6 pg (ref 26.0–34.0)
MCHC: 31.4 g/dL (ref 30.0–36.0)
MCV: 94 fL (ref 80.0–100.0)
MONO ABS: 0.4 10*3/uL (ref 0.1–1.0)
MONOS PCT: 4 %
NEUTROS PCT: 78 %
Neutro Abs: 7.4 10*3/uL (ref 1.7–7.7)
PLATELETS: 223 10*3/uL (ref 150–400)
RBC: 3.35 MIL/uL — ABNORMAL LOW (ref 3.87–5.11)
RDW: 15.8 % — AB (ref 11.5–15.5)
WBC: 9.5 10*3/uL (ref 4.0–10.5)
nRBC: 0 % (ref 0.0–0.2)

## 2017-11-30 LAB — IRON AND TIBC
IRON: 89 ug/dL (ref 28–170)
SATURATION RATIOS: 27 % (ref 10.4–31.8)
TIBC: 335 ug/dL (ref 250–450)
UIBC: 246 ug/dL

## 2017-11-30 LAB — FERRITIN: Ferritin: 139 ng/mL (ref 11–307)

## 2017-11-30 NOTE — Progress Notes (Signed)
Patient here for follow up. She has recently been hospitalized for pneumonia.

## 2017-12-01 ENCOUNTER — Ambulatory Visit: Payer: PPO | Admitting: Physical Therapy

## 2017-12-06 NOTE — Progress Notes (Signed)
Hematology/Oncology Consult note [facility]  Telephone:([phone] [phone]  Patient Care Team: Guadalupe Maple, MD as PCP - General (Family Medicine)   Name of the patient: Deanna Bird  [id number]  [birth year]   Date of visit: 12/06/17  Diagnosis- 1. Leucocytosis likely reactive 2. Iron deficiency anemia  Chief complaint/ Reason for visit- routine f/u of leucocytosis  Heme/Onc history: patient is a 84 year old Caucasian female who has been referred to [place] for leukocytosis.Patient has past medical history significant for spinal stenosis and ongoing radiculopathy. She also has hypertension diabetes and hyperlipidemia. Patient was noted to have a normal white count up until November 2018. Her white count was 10.9 in August 2019 and further increased to 14.3 on 10/05/2017. Differential mainly shows neutrophilia. Platelet counts are normal. Her hemoglobin has been around 11 up until last year and then presently down to 10.2.She reports some trouble with her balance. Her appetite is good and she denies any unintentional weight loss. Denies any recurrent infections or hospitalizations.  Results of blood from 10/12/2017 were as follows: CBC showed white count of 16.2, H&H of 10.0 and 32.1.  Differential mainly showed neutrophilia with an absolute neutrophil count of 10.7.  Platelet count was normal at 276.  Multiple myeloma panel showed no M protein.  Serum free light chain ratio was abnormal at 1.8.  With elevated kappa and lambda light chain at 73 and 40 respectively.  Folate was normal at 17.  B12 was elevated at 1236.  Ferritin was normal at 161.  Iron studies showed a low iron saturation of 7% and a low serum iron of 17.  Haptoglobin was elevated at 237.  Reticulocyte count was low normal at 2% for the degree of anemia.   Interval history- patient reports doing well. She had an episode of pneumonia 3 weeks ago which is gradually improving.  Denies any pain or sob.   ECOG PS- 1 Pain scale- 0 Opioid associated constipation- no  Review of systems- Review of Systems  Constitutional: Positive for malaise/fatigue. Negative for chills, fever and weight loss.  HENT: Negative for congestion, ear discharge and nosebleeds.   Eyes: Negative for blurred vision.  Respiratory: Negative for cough, hemoptysis, sputum production, shortness of breath and wheezing.   Cardiovascular: Negative for chest pain, palpitations, orthopnea and claudication.  Gastrointestinal: Negative for abdominal pain, blood in stool, constipation, diarrhea, heartburn, melena, nausea and vomiting.  Genitourinary: Negative for dysuria, flank pain, frequency, hematuria and urgency.  Musculoskeletal: Negative for back pain, joint pain and myalgias.  Skin: Negative for rash.  Neurological: Negative for dizziness, tingling, focal weakness, seizures, weakness and headaches.  Endo/Heme/Allergies: Does not bruise/bleed easily.  Psychiatric/Behavioral: Negative for depression and suicidal ideas. The patient does not have insomnia.        Allergies  Allergen Reactions  . Procaine Palpitations and Other (See Comments)    Rapid heartbeat     Past Medical History:  Diagnosis Date  . Achalasia   . Barrett's esophagus with dysplasia   . Diabetes mellitus without complication ([place])   . GERD (gastroesophageal reflux disease)   . Hyperlipidemia   . Hypertension   . Kidney cancer, primary, with metastasis from kidney to other site [facility])    Son  . MRSA infection    lung  . Thyroid disease      Past Surgical History:  Procedure Laterality Date  . ABDOMINAL HYSTERECTOMY     complete  . achalasia    . APPENDECTOMY    .  HIATAL HERNIA REPAIR    . KNEE ARTHROPLASTY Right 04/01/2016   Procedure: COMPUTER ASSISTED TOTAL KNEE ARTHROPLASTY;  Surgeon: Dereck Leep, MD;  Location: [facility] ORS;  Service: Orthopedics;  Laterality: Right;  . KNEE ARTHROPLASTY Left 11/30/2016    Procedure: COMPUTER ASSISTED TOTAL KNEE ARTHROPLASTY;  Surgeon: Dereck Leep, MD;  Location: [facility] ORS;  Service: Orthopedics;  Laterality: Left;  . PARATHYROIDECTOMY    . THYROID EXPLORATION    . TONSILLECTOMY      Social History   Socioeconomic History  . Marital status: Married    Spouse name: Not on file  . Number of children: Not on file  . Years of education: 68  . Highest education level: High school graduate  Occupational History  . Not on file  Social Needs  . Financial resource strain: Not hard at all  . Food insecurity:    Worry: Never true    Inability: Never true  . Transportation needs:    Medical: No    Non-medical: No  Tobacco Use  . Smoking status: Former Smoker    Packs/day: 0.25    Years: 1.00    Pack years: 0.25    Last attempt to quit: 10/13/1962    Years since quitting: 55.1  . Smokeless tobacco: Never Used  . Tobacco comment: smoked one pack a week for 1 year - 55 years ago  Substance and Sexual Activity  . Alcohol use: No  . Drug use: No  . Sexual activity: Not Currently  Lifestyle  . Physical activity:    Days per week: 0 days    Minutes per session: 0 min  . Stress: Not at all  Relationships  . Social connections:    Talks on phone: More than three times a week    Gets together: More than three times a week    Attends religious service: More than 4 times per year    Active member of club or organization: No    Attends meetings of clubs or organizations: Never    Relationship status: Married  . Intimate partner violence:    Fear of current or ex partner: No    Emotionally abused: No    Physically abused: No    Forced sexual activity: No  Other Topics Concern  . Not on file  Social History Narrative  . Not on file    Family History  Problem Relation Age of Onset  . Heart disease Mother   . Diabetes Father   . Lung cancer Sister   . Diabetes Sister   . Stomach cancer Brother   . CAD Brother   . Kidney cancer Son       Current Outpatient Medications:  .  Cholecalciferol 10000 units TABS, Take 1 tablet by mouth daily before supper., Disp: , Rfl:  .  Fenofibric Acid 105 MG TABS, Take 1 tablet (105 mg total) by mouth daily., Disp: 30 tablet, Rfl: 12 .  ferrous sulfate 325 (65 FE) MG tablet, Take 325 mg by mouth 2 (two) times daily., Disp: , Rfl:  .  glucose blood (ONE TOUCH ULTRA TEST) test strip, 1 each by Other route as directed. , Disp: , Rfl:  .  hydrocortisone (ANUSOL-HC) 2.5 % rectal cream, Place 1 application rectally 2 (two) times daily., Disp: 30 g, Rfl: 0 .  losartan-hydrochlorothiazide (HYZAAR) 100-25 MG tablet, Take 1 tablet by mouth daily., Disp: 30 tablet, Rfl: 12 .  Melatonin 1 MG CAPS, Take 1 capsule by mouth at bedtime  as needed (sleep). , Disp: , Rfl:  .  metoprolol succinate (TOPROL-XL) 100 MG 24 hr tablet, Take 1 tablet (100 mg total) by mouth daily. Take with or immediately following a meal., Disp: 30 tablet, Rfl: 12  Physical exam:  Vitals:   11/30/17 1435 11/30/17 1439  BP:  (!) 154/76  Pulse:  62  Resp: 12   Temp:  98.6 F (37 C)  TempSrc:  Tympanic  Weight: 122 lb 6.4 oz (55.5 kg)   Height: 5' 3.5" (1.613 m)    Physical Exam  Constitutional: She is oriented to person, place, and time.  Thin elderly lady in no acute distress  HENT:  Head: Normocephalic and atraumatic.  Eyes: Pupils are equal, round, and reactive to light. EOM are normal.  Neck: Normal range of motion.  Cardiovascular: Normal rate, regular rhythm and normal heart sounds.  Pulmonary/Chest: Effort normal and breath sounds normal.  Abdominal: Soft. Bowel sounds are normal.  Neurological: She is alert and oriented to person, place, and time.  Skin: Skin is warm and dry.     CMP Latest Ref Rng & Units 11/04/2017  Glucose 70 - 99 mg/dL 128(H)  BUN 8 - 23 mg/dL 24(H)  Creatinine 0.44 - 1.00 mg/dL 1.08(H)  Sodium 135 - 145 mmol/L 138  Potassium 3.5 - 5.1 mmol/L 3.5  Chloride 98 - 111 mmol/L 104  CO2 22  - 32 mmol/L 27  Calcium 8.9 - 10.3 mg/dL 8.3(L)  Total Protein 6.5 - 8.1 g/dL -  Total Bilirubin 0.3 - 1.2 mg/dL -  Alkaline Phos 38 - 126 U/L -  AST 15 - 41 U/L -  ALT 0 - 44 U/L -   CBC Latest Ref Rng & Units 11/30/2017  WBC 4.0 - 10.5 K/uL 9.5  Hemoglobin 12.0 - 15.0 g/dL 9.9(L)  Hematocrit 36.0 - 46.0 % 31.5(L)  Platelets 150 - 400 K/uL 223     Assessment and plan- Patient is a 84 y.o. female with following issues:  1. Leucocytosis has now resolved. JAK2, BCR ABL and flow cytometry unremarkable. Continue to monitor  2. Iron deficiency anemia: iron studies have improved. No significant improvement in her anemia. Hb ~ 10 for last 2 months. I will see her back in 2 months with cbc ferritin and iron studies   Visit Diagnosis 1. Leukocytosis, unspecified type   2. Normocytic anemia   3. Iron deficiency anemia, unspecified iron deficiency anemia type      Dr. Randa Evens, MD, MPH [facility] [phone] 12/06/2017 1:14 PM

## 2017-12-08 ENCOUNTER — Encounter: Payer: Self-pay | Admitting: Family Medicine

## 2017-12-15 ENCOUNTER — Telehealth: Payer: Self-pay

## 2017-12-15 MED ORDER — FENOFIBRATE 134 MG PO CAPS: 134.0000 mg | ORAL_CAPSULE | Freq: Every day | ORAL | 11 refills | Status: DC

## 2017-12-15 NOTE — Telephone Encounter (Signed)
Pharmacy sent a fax stating that the fenofibric acid  Product is unavailable from the manufacturer. They are requesting an alternative be sent in as they state that they provided the patient with the 10 tablets they had left on hand. Please advise.

## 2017-12-21 DIAGNOSIS — N183 Chronic kidney disease, stage 3 unspecified: Secondary | ICD-10-CM | POA: Insufficient documentation

## 2017-12-21 DIAGNOSIS — E119 Type 2 diabetes mellitus without complications: Secondary | ICD-10-CM | POA: Diagnosis not present

## 2017-12-21 DIAGNOSIS — I10 Essential (primary) hypertension: Secondary | ICD-10-CM | POA: Diagnosis not present

## 2017-12-21 DIAGNOSIS — K219 Gastro-esophageal reflux disease without esophagitis: Secondary | ICD-10-CM | POA: Diagnosis not present

## 2017-12-21 DIAGNOSIS — R269 Unspecified abnormalities of gait and mobility: Secondary | ICD-10-CM | POA: Diagnosis not present

## 2017-12-21 DIAGNOSIS — J189 Pneumonia, unspecified organism: Secondary | ICD-10-CM | POA: Diagnosis not present

## 2017-12-23 ENCOUNTER — Other Ambulatory Visit: Payer: Self-pay | Admitting: Internal Medicine

## 2017-12-23 DIAGNOSIS — J90 Pleural effusion, not elsewhere classified: Secondary | ICD-10-CM

## 2017-12-31 ENCOUNTER — Ambulatory Visit
Admission: RE | Admit: 2017-12-31 | Discharge: 2017-12-31 | Disposition: A | Discharge status: Home / Self Care | Payer: PPO | Source: Ambulatory Visit | Attending: Internal Medicine | Admitting: Internal Medicine

## 2017-12-31 DIAGNOSIS — J90 Pleural effusion, not elsewhere classified: Secondary | ICD-10-CM | POA: Diagnosis not present

## 2017-12-31 DIAGNOSIS — K449 Diaphragmatic hernia without obstruction or gangrene: Secondary | ICD-10-CM | POA: Diagnosis not present

## 2017-12-31 HISTORY — DX: Disorder of kidney and ureter, unspecified: N28.9

## 2017-12-31 LAB — POCT I-STAT CREATININE
CREATININE: 1.3 mg/dL — AB (ref 0.44–1.00)
Creatinine, Ser: 0.2 mg/dL — ABNORMAL LOW (ref 0.44–1.00)

## 2017-12-31 MED ORDER — IOPAMIDOL (ISOVUE-300) INJECTION 61%
75.0000 mL | Freq: Once | INTRAVENOUS | Status: AC | PRN
  Administered 2017-12-31: 60 mL via INTRAVENOUS

## 2018-01-06 DIAGNOSIS — R918 Other nonspecific abnormal finding of lung field: Secondary | ICD-10-CM | POA: Insufficient documentation

## 2018-01-10 ENCOUNTER — Encounter: Payer: Self-pay | Admitting: Oncology

## 2018-01-10 ENCOUNTER — Other Ambulatory Visit: Payer: Self-pay

## 2018-01-10 ENCOUNTER — Ambulatory Visit: Payer: PPO | Admitting: Family Medicine

## 2018-01-10 ENCOUNTER — Inpatient Hospital Stay: Payer: PPO | Attending: Oncology | Admitting: Oncology

## 2018-01-10 ENCOUNTER — Other Ambulatory Visit: Payer: Self-pay | Admitting: *Deleted

## 2018-01-10 VITALS — BP 135/64 | HR 56 | Temp 96.2°F | Resp 18 | Wt 117.3 lb

## 2018-01-10 DIAGNOSIS — R918 Other nonspecific abnormal finding of lung field: Secondary | ICD-10-CM | POA: Diagnosis not present

## 2018-01-10 NOTE — Progress Notes (Signed)
Here for f/u. Pt denied pain but stated dx w pneumonia  R LL -and has intermittent pain w deep inspiration. Stated left upper lobe of chest CT -showed " a mass-"that's why Im here -per pt.

## 2018-01-10 NOTE — Progress Notes (Signed)
Hematology/Oncology Consult note [facility]  [phone] [phone]  Patient Care Team: Baxter Hire, MD as PCP - General (Internal Medicine)   Name of the patient: Deanna Bird  [id number]  24-Apr-1928   Date of visit: 01/10/18  Diagnosis-abnormal CT chest concerning for lung cancer  Chief complaint/ Reason for visit-discuss CT chest findings and further management  Heme/Onc history: Patient is a 85 year old female who has a remote history of smoking.  She smoked 1 pack/week for about 1 year and that was 50 years ago.  She initially saw me for her leukocytosis and work-up revealed a reactive cause.  Patient also recovered from a recent bout of pneumonia and was given 10 days of Augmentin.Patient underwent CT chest with contrast on 12/31/2017 which showed an irregular density in the left upper lobe 13 x 11 mm concerning for malignancies.  Multiple small faint nodular densities throughout the lungs concerning for metastatic disease.  1.8 cm right hilar lymph node concerning for metastatic disease.  Irregular right upper lobe airspace opacity which may represent pneumonia or scarring or fibrosis  Interval history-patient reports fatigue and ongoing weight loss.  Reports that she has lost about 70 pounds over the last 2 years.  Appetite is fair.  Reports right-sided pleuritic chest pain when she takes a deep breath.  ECOG PS- 1 Pain scale- 0 Opioid associated constipation- no  Review of systems- Review of Systems  Constitutional: Positive for malaise/fatigue and weight loss. Negative for chills and fever.  HENT: Negative for congestion, ear discharge and nosebleeds.   Eyes: Negative for blurred vision.  Respiratory: Negative for cough, hemoptysis, sputum production, shortness of breath and wheezing.        Right sided pleuritic chest wall pain  Cardiovascular: Negative for chest pain, palpitations, orthopnea and claudication.    Gastrointestinal: Negative for abdominal pain, blood in stool, constipation, diarrhea, heartburn, melena, nausea and vomiting.  Genitourinary: Negative for dysuria, flank pain, frequency, hematuria and urgency.  Musculoskeletal: Negative for back pain, joint pain and myalgias.  Skin: Negative for rash.  Neurological: Negative for dizziness, tingling, focal weakness, seizures, weakness and headaches.  Endo/Heme/Allergies: Does not bruise/bleed easily.  Psychiatric/Behavioral: Negative for depression and suicidal ideas. The patient does not have insomnia.       Allergies  Allergen Reactions  . Procaine Palpitations and Other (See Comments)    Rapid heartbeat     Past Medical History:  Diagnosis Date  . Achalasia   . Barrett's esophagus with dysplasia   . Diabetes mellitus without complication ([place])   . GERD (gastroesophageal reflux disease)   . Hyperlipidemia   . Hypertension   . Kidney cancer, primary, with metastasis from kidney to other site [facility])    Son  . MRSA infection    lung  . Renal insufficiency   . Thyroid disease      Past Surgical History:  Procedure Laterality Date  . ABDOMINAL HYSTERECTOMY     complete  . achalasia    . APPENDECTOMY    . HIATAL HERNIA REPAIR    . KNEE ARTHROPLASTY Right 04/01/2016   Procedure: COMPUTER ASSISTED TOTAL KNEE ARTHROPLASTY;  Surgeon: Dereck Leep, MD;  Location: [facility] ORS;  Service: Orthopedics;  Laterality: Right;  . KNEE ARTHROPLASTY Left 11/30/2016   Procedure: COMPUTER ASSISTED TOTAL KNEE ARTHROPLASTY;  Surgeon: Dereck Leep, MD;  Location: [facility] ORS;  Service: Orthopedics;  Laterality: Left;  . PARATHYROIDECTOMY    . THYROID EXPLORATION    .  TONSILLECTOMY      Social History   Socioeconomic History  . Marital status: Married    Spouse name: Not on file  . Number of children: Not on file  . Years of education: 26  . Highest education level: High school graduate  Occupational History  . Not on file  Social  Needs  . Financial resource strain: Not hard at all  . Food insecurity:    Worry: Never true    Inability: Never true  . Transportation needs:    Medical: No    Non-medical: No  Tobacco Use  . Smoking status: Former Smoker    Packs/day: 0.25    Years: 1.00    Pack years: 0.25    Last attempt to quit: 10/13/1962    Years since quitting: 55.2  . Smokeless tobacco: Never Used  . Tobacco comment: smoked one pack a week for 1 year - 55 years ago  Substance and Sexual Activity  . Alcohol use: No  . Drug use: No  . Sexual activity: Not Currently  Lifestyle  . Physical activity:    Days per week: 0 days    Minutes per session: 0 min  . Stress: Not at all  Relationships  . Social connections:    Talks on phone: More than three times a week    Gets together: More than three times a week    Attends religious service: More than 4 times per year    Active member of club or organization: No    Attends meetings of clubs or organizations: Never    Relationship status: Married  . Intimate partner violence:    Fear of current or ex partner: No    Emotionally abused: No    Physically abused: No    Forced sexual activity: No  Other Topics Concern  . Not on file  Social History Narrative  . Not on file    Family History  Problem Relation Age of Onset  . Heart disease Mother   . Diabetes Father   . Lung cancer Sister   . Diabetes Sister   . Stomach cancer Brother   . CAD Brother   . Kidney cancer Son      Current Outpatient Medications:  .  Cholecalciferol 10000 units TABS, Take 1 tablet by mouth daily before supper., Disp: , Rfl:  .  fenofibrate micronized (LOFIBRA) 134 MG capsule, Take 1 capsule (134 mg total) by mouth daily before breakfast., Disp: 30 capsule, Rfl: 11 .  Fenofibric Acid 105 MG TABS, Take 1 tablet (105 mg total) by mouth daily., Disp: 30 tablet, Rfl: 12 .  ferrous sulfate 325 (65 FE) MG tablet, Take 325 mg by mouth 2 (two) times daily., Disp: , Rfl:  .   hydrocortisone (ANUSOL-HC) 2.5 % rectal cream, Place 1 application rectally 2 (two) times daily., Disp: 30 g, Rfl: 0 .  losartan-hydrochlorothiazide (HYZAAR) 100-25 MG tablet, Take 1 tablet by mouth daily., Disp: 30 tablet, Rfl: 12 .  Melatonin 1 MG CAPS, Take 1 capsule by mouth at bedtime as needed (sleep). , Disp: , Rfl:  .  metoprolol succinate (TOPROL-XL) 100 MG 24 hr tablet, Take 1 tablet (100 mg total) by mouth daily. Take with or immediately following a meal., Disp: 30 tablet, Rfl: 12 .  OMEGA-3 FATTY ACIDS PO, Take by mouth., Disp: , Rfl:  .  glucose blood (ONE TOUCH ULTRA TEST) test strip, 1 each by Other route as directed. , Disp: , Rfl:   Physical  exam:  Vitals:   01/10/18 1112  BP: 135/64  Pulse: (!) 56  Resp: 18  Temp: (!) 96.2 F (35.7 C)  TempSrc: Tympanic  Weight: 117 lb 4.8 oz (53.2 kg)   Physical Exam Constitutional:      Comments: Thin elderly female in no acute distress  HENT:     Head: Normocephalic and atraumatic.  Eyes:     Pupils: Pupils are equal, round, and reactive to light.  Neck:     Musculoskeletal: Normal range of motion.  Cardiovascular:     Rate and Rhythm: Normal rate and regular rhythm.     Heart sounds: Normal heart sounds.  Pulmonary:     Effort: Pulmonary effort is normal.     Breath sounds: Normal breath sounds.  Abdominal:     General: Bowel sounds are normal.     Palpations: Abdomen is soft.  Skin:    General: Skin is warm and dry.  Neurological:     Mental Status: She is alert and oriented to person, place, and time.      CMP Latest Ref Rng & Units 12/31/2017  Glucose 70 - 99 mg/dL -  BUN 8 - 23 mg/dL -  Creatinine 0.44 - 1.00 mg/dL 1.30(H)  Sodium 135 - 145 mmol/L -  Potassium 3.5 - 5.1 mmol/L -  Chloride 98 - 111 mmol/L -  CO2 22 - 32 mmol/L -  Calcium 8.9 - 10.3 mg/dL -  Total Protein 6.5 - 8.1 g/dL -  Total Bilirubin 0.3 - 1.2 mg/dL -  Alkaline Phos 38 - 126 U/L -  AST 15 - 41 U/L -  ALT 0 - 44 U/L -   CBC Latest  Ref Rng & Units 11/30/2017  WBC 4.0 - 10.5 K/uL 9.5  Hemoglobin 12.0 - 15.0 g/dL 9.9(L)  Hematocrit 36.0 - 46.0 % 31.5(L)  Platelets 150 - 400 K/uL 223    No images are attached to the encounter.  Ct Chest W Contrast  Result Date: 12/31/2017 CLINICAL DATA:  Right-sided chest pain. EXAM: CT CHEST WITH CONTRAST TECHNIQUE: Multidetector CT imaging of the chest was performed during intravenous contrast administration. CONTRAST:  104mL ISOVUE-300 IOPAMIDOL (ISOVUE-300) INJECTION 61% COMPARISON:  Radiographs of November 03, 2017. CT scan of August 30, 2015. FINDINGS: Cardiovascular: Atherosclerosis of thoracic aorta is noted without aneurysm or dissection. Coronary artery calcifications are noted. Normal cardiac size. No pericardial effusion is noted. Mediastinum/Nodes: Thyroid gland is unremarkable. Moderate size sliding-type hiatal hernia is noted. 1.8 cm right hilar lymph node is noted. Lungs/Pleura: No pneumothorax or pleural effusion is noted. Multiple small nodular densities are noted throughout both lungs concerning for metastatic disease. 13 x 11 mm irregular density is noted posteriorly in the left upper lobe best seen on image number 49 of series 3 consistent with malignancy. Stable right basilar scarring is noted. New right upper lobe airspace opacity is noted which may represent pneumonia, but scarring can not be excluded. Some degree of bronchiectasis is noted in this area as well. Upper Abdomen: No acute abnormality. Musculoskeletal: No chest wall abnormality. No acute or significant osseous findings. IMPRESSION: 13 x 11 mm irregular density is noted in left upper lobe concerning for malignancy. Also noted are multiple small faint nodular densities throughout both lungs concerning for metastatic disease. 1.8 cm right hilar lymph node is noted concerning for metastatic disease. PET scan is recommended for further evaluation. These results will be called to the ordering clinician or representative by  the Radiologist Assistant, and communication documented  in the PACS or zVision Dashboard. Irregular right upper lobe airspace opacity is noted which may represent pneumonia, but scarring or fibrosis can not be excluded. Some degree of fibrosis is noted in this area. Moderate size sliding-type hiatal hernia. Coronary artery calcifications are noted. Aortic Atherosclerosis (ICD10-I70.0). Electronically Signed   By: Marijo Conception, M.D.   On: 12/31/2017 16:31     Assessment and plan- Patient is a 85 y.o. female who was initially seen by me for leukocytosis now found to have spiculated left upper lobe lung mass of about 2 cm, right hilar lymphadenopathy and bilateral tiny pulmonary nodules concerning for metastatic disease  I have reviewed CT chest images independently and discussed findings with the patient.  Patient is a small 13 x 11 mm spiculated density in the left upper lobe concerning for malignancy.  There is also evidence of tiny bilateral pulmonary nodules. Concerning for metastatic disease as well as 1.8 cm right hilar lymph node.  Also found to have evidence of right upper lobe airspace disease.  I will proceed with a PET CT scan at this time and discuss her case at tumor board followed by plans for biopsy CT-guided versus bronchoscopy guided biopsy.  I will tentatively see the patient back in 10 days time to discuss the results of the PET CT scan and further management   Visit Diagnosis 1. Abnormal CT scan of lung   2. Lung mass      Dr. Randa Evens, MD, MPH [facility] [phone] 01/10/2018 12:55 PM

## 2018-01-12 ENCOUNTER — Encounter
Admission: RE | Admit: 2018-01-12 | Discharge: 2018-01-12 | Disposition: A | Discharge status: Home / Self Care | Payer: PPO | Source: Ambulatory Visit | Attending: Oncology | Admitting: Oncology

## 2018-01-12 DIAGNOSIS — R911 Solitary pulmonary nodule: Secondary | ICD-10-CM | POA: Diagnosis not present

## 2018-01-12 DIAGNOSIS — R918 Other nonspecific abnormal finding of lung field: Secondary | ICD-10-CM | POA: Insufficient documentation

## 2018-01-12 LAB — GLUCOSE, CAPILLARY: Glucose-Capillary: 90 mg/dL (ref 70–99)

## 2018-01-12 MED ORDER — FLUDEOXYGLUCOSE F - 18 (FDG) INJECTION
6.1000 | Freq: Once | INTRAVENOUS | Status: AC | PRN
  Administered 2018-01-12: 6 via INTRAVENOUS

## 2018-01-13 ENCOUNTER — Other Ambulatory Visit: Payer: PPO

## 2018-01-13 NOTE — Progress Notes (Signed)
Tumor Board Documentation  Deanna Bird was presented by Dr Janese Banks at our Tumor Board on 01/13/2018, which included representatives from medical oncology, radiation oncology, surgical oncology, internal medicine, navigation, pathology, radiology, surgical, research, palliative care, pulmonology.  Deanna Bird currently presents as a current patient, for discussion with history of the following treatments: active survellience.  Additionally, we reviewed previous medical and familial history, history of present illness, and recent lab results along with all available histopathologic and imaging studies. The tumor board considered available treatment options and made the following recommendations: Active surveillance Refer to Pulmonology  The following procedures/referrals were also placed: No orders of the defined types were placed in this encounter.   Clinical Trial Status: not discussed   Staging used:    National site-specific guidelines   were discussed with respect to the case.  Tumor board is a meeting of clinicians from various specialty areas who evaluate and discuss patients for whom a multidisciplinary approach is being considered. Final determinations in the plan of care are those of the provider(s). The responsibility for follow up of recommendations given during tumor board is that of the provider.   Today's extended care, comprehensive team conference, [name] was not present for the discussion and was not examined.   Multidisciplinary Tumor Board is a multidisciplinary case peer review process.  Decisions discussed in the Multidisciplinary Tumor Board reflect the opinions of the specialists present at the conference without having examined the patient.  Ultimately, treatment and diagnostic decisions rest with the primary provider(s) and the patient.

## 2018-01-19 ENCOUNTER — Ambulatory Visit: Payer: PPO | Admitting: Family Medicine

## 2018-01-20 ENCOUNTER — Inpatient Hospital Stay (HOSPITAL_BASED_OUTPATIENT_CLINIC_OR_DEPARTMENT_OTHER): Payer: PPO | Admitting: Oncology

## 2018-01-20 ENCOUNTER — Encounter: Payer: Self-pay | Admitting: Oncology

## 2018-01-20 VITALS — BP 136/62 | HR 64 | Temp 97.2°F | Resp 16 | Wt 118.0 lb

## 2018-01-20 DIAGNOSIS — J3489 Other specified disorders of nose and nasal sinuses: Secondary | ICD-10-CM

## 2018-01-20 DIAGNOSIS — R918 Other nonspecific abnormal finding of lung field: Secondary | ICD-10-CM | POA: Diagnosis not present

## 2018-01-20 DIAGNOSIS — R911 Solitary pulmonary nodule: Secondary | ICD-10-CM

## 2018-01-20 DIAGNOSIS — R942 Abnormal results of pulmonary function studies: Secondary | ICD-10-CM

## 2018-01-20 DIAGNOSIS — L570 Actinic keratosis: Secondary | ICD-10-CM | POA: Diagnosis not present

## 2018-01-20 NOTE — Progress Notes (Signed)
Hematology/Oncology Consult note [facility]  Telephone:([phone] [phone]  Patient Care Team: Baxter Hire, MD as PCP - General (Internal Medicine) Telford Nab, RN as Registered Nurse   Name of the patient: Deanna Bird  [id number]  [birth year]   Date of visit: 01/20/18  Diagnosis-lung nodules-etiology unclear infection versus malignancy  Chief complaint/ Reason for visit-discuss results of PET CT scan  Heme/Onc history: Patient is a 85 year old female who has a remote history of smoking.  She smoked 1 pack/week for about 1 year and that was 50 years ago.  She initially saw me for her leukocytosis and work-up revealed a reactive cause.  Patient also recovered from a recent bout of pneumonia and was given 10 days of Augmentin.Patient underwent CT chest with contrast on 12/31/2017 which showed an irregular density in the left upper lobe 13 x 11 mm concerning for malignancies.  Multiple small faint nodular densities throughout the lungs concerning for metastatic disease.  1.8 cm right hilar lymph node concerning for metastatic disease.  Irregular right upper lobe airspace opacity which may represent pneumonia or scarring or fibrosis  PET CT scan showed 11 mm posterior left upper lobe pulmonary nodule with a low uptake of 2.8.Given the findings in the right lung this may be infectious/inflammatory etiology while low-grade or well-differentiated neoplasm not excluded.  Diffuse airspace consolidation with airway impaction and tree-in-bud opacity in the right upper lobe.  Imaging features suspicious for infectious etiology and atypical infection would be a consideration.  Wall thickening and low-level FDG accumulation in the distal esophagus of indeterminate etiology an upper endoscopy may be warranted   Interval history-she continues to have issues with her sinuses including chronic nasal drainage and congestion.  Denies other complaints today  ECOG  PS- 1 Pain scale- 0 Opioid associated constipation- no  Review of systems- Review of Systems  Constitutional: Positive for malaise/fatigue. Negative for chills, fever and weight loss.  HENT: Positive for congestion and sinus pain. Negative for ear discharge and nosebleeds.   Eyes: Negative for blurred vision.  Respiratory: Negative for cough, hemoptysis, sputum production, shortness of breath and wheezing.   Cardiovascular: Negative for chest pain, palpitations, orthopnea and claudication.  Gastrointestinal: Negative for abdominal pain, blood in stool, constipation, diarrhea, heartburn, melena, nausea and vomiting.  Genitourinary: Negative for dysuria, flank pain, frequency, hematuria and urgency.  Musculoskeletal: Negative for back pain, joint pain and myalgias.  Skin: Negative for rash.  Neurological: Negative for dizziness, tingling, focal weakness, seizures, weakness and headaches.  Endo/Heme/Allergies: Does not bruise/bleed easily.  Psychiatric/Behavioral: Negative for depression and suicidal ideas. The patient does not have insomnia.       Allergies  Allergen Reactions  . Procaine Palpitations and Other (See Comments)    Rapid heartbeat     Past Medical History:  Diagnosis Date  . Achalasia   . Barrett's esophagus with dysplasia   . Diabetes mellitus without complication ([place])   . GERD (gastroesophageal reflux disease)   . Hyperlipidemia   . Hypertension   . Kidney cancer, primary, with metastasis from kidney to other site [facility])    Son  . MRSA infection    lung  . Renal insufficiency   . Thyroid disease      Past Surgical History:  Procedure Laterality Date  . ABDOMINAL HYSTERECTOMY     complete  . achalasia    . APPENDECTOMY    . HIATAL HERNIA REPAIR    . KNEE ARTHROPLASTY Right 04/01/2016  Procedure: COMPUTER ASSISTED TOTAL KNEE ARTHROPLASTY;  Surgeon: Dereck Leep, MD;  Location: [facility] ORS;  Service: Orthopedics;  Laterality: Right;  . KNEE ARTHROPLASTY  Left 11/30/2016   Procedure: COMPUTER ASSISTED TOTAL KNEE ARTHROPLASTY;  Surgeon: Dereck Leep, MD;  Location: [facility] ORS;  Service: Orthopedics;  Laterality: Left;  . PARATHYROIDECTOMY    . THYROID EXPLORATION    . TONSILLECTOMY      Social History   Socioeconomic History  . Marital status: Married    Spouse name: Not on file  . Number of children: Not on file  . Years of education: 41  . Highest education level: High school graduate  Occupational History  . Not on file  Social Needs  . Financial resource strain: Not hard at all  . Food insecurity:    Worry: Never true    Inability: Never true  . Transportation needs:    Medical: No    Non-medical: No  Tobacco Use  . Smoking status: Former Smoker    Packs/day: 0.25    Years: 1.00    Pack years: 0.25    Last attempt to quit: 10/13/1962    Years since quitting: 55.3  . Smokeless tobacco: Never Used  . Tobacco comment: smoked one pack a week for 1 year - 55 years ago  Substance and Sexual Activity  . Alcohol use: No  . Drug use: No  . Sexual activity: Not Currently  Lifestyle  . Physical activity:    Days per week: 0 days    Minutes per session: 0 min  . Stress: Not at all  Relationships  . Social connections:    Talks on phone: More than three times a week    Gets together: More than three times a week    Attends religious service: More than 4 times per year    Active member of club or organization: No    Attends meetings of clubs or organizations: Never    Relationship status: Married  . Intimate partner violence:    Fear of current or ex partner: No    Emotionally abused: No    Physically abused: No    Forced sexual activity: No  Other Topics Concern  . Not on file  Social History Narrative  . Not on file    Family History  Problem Relation Age of Onset  . Heart disease Mother   . Diabetes Father   . Lung cancer Sister   . Diabetes Sister   . Stomach cancer Brother   . CAD Brother   . Kidney  cancer Son      Current Outpatient Medications:  .  Cholecalciferol 10000 units TABS, Take 1 tablet by mouth daily before supper., Disp: , Rfl:  .  fenofibrate micronized (LOFIBRA) 134 MG capsule, Take 1 capsule (134 mg total) by mouth daily before breakfast., Disp: 30 capsule, Rfl: 11 .  Fenofibric Acid 105 MG TABS, Take 1 tablet (105 mg total) by mouth daily., Disp: 30 tablet, Rfl: 12 .  ferrous sulfate 325 (65 FE) MG tablet, Take 325 mg by mouth 2 (two) times daily., Disp: , Rfl:  .  glucose blood (ONE TOUCH ULTRA TEST) test strip, 1 each by Other route as directed. , Disp: , Rfl:  .  hydrocortisone (ANUSOL-HC) 2.5 % rectal cream, Place 1 application rectally 2 (two) times daily., Disp: 30 g, Rfl: 0 .  loratadine (CLARITIN) 10 MG tablet, Take 10 mg by mouth daily., Disp: , Rfl:  .  losartan-hydrochlorothiazide (  HYZAAR) 100-25 MG tablet, Take 1 tablet by mouth daily., Disp: 30 tablet, Rfl: 12 .  Melatonin 1 MG CAPS, Take 1 capsule by mouth at bedtime as needed (sleep). , Disp: , Rfl:  .  metoprolol succinate (TOPROL-XL) 100 MG 24 hr tablet, Take 1 tablet (100 mg total) by mouth daily. Take with or immediately following a meal., Disp: 30 tablet, Rfl: 12 .  OMEGA-3 FATTY ACIDS PO, Take by mouth., Disp: , Rfl:   Physical exam:  Vitals:   01/24/18 0855  BP: 136/62  Pulse: 64  Resp: 16  Temp: (!) 97.2 F (36.2 C)  Weight: 118 lb (53.5 kg)   Physical Exam Constitutional:      General: She is not in acute distress. HENT:     Head: Normocephalic and atraumatic.  Eyes:     Pupils: Pupils are equal, round, and reactive to light.  Neck:     Musculoskeletal: Normal range of motion.  Cardiovascular:     Rate and Rhythm: Normal rate and regular rhythm.     Heart sounds: Normal heart sounds.  Pulmonary:     Effort: Pulmonary effort is normal.     Breath sounds: Normal breath sounds.  Abdominal:     General: Bowel sounds are normal.     Palpations: Abdomen is soft.  Skin:    General:  Skin is warm and dry.  Neurological:     Mental Status: She is alert and oriented to person, place, and time.      CMP Latest Ref Rng & Units 12/31/2017  Glucose 70 - 99 mg/dL -  BUN 8 - 23 mg/dL -  Creatinine 0.44 - 1.00 mg/dL 1.30(H)  Sodium 135 - 145 mmol/L -  Potassium 3.5 - 5.1 mmol/L -  Chloride 98 - 111 mmol/L -  CO2 22 - 32 mmol/L -  Calcium 8.9 - 10.3 mg/dL -  Total Protein 6.5 - 8.1 g/dL -  Total Bilirubin 0.3 - 1.2 mg/dL -  Alkaline Phos 38 - 126 U/L -  AST 15 - 41 U/L -  ALT 0 - 44 U/L -   CBC Latest Ref Rng & Units 11/30/2017  WBC 4.0 - 10.5 K/uL 9.5  Hemoglobin 12.0 - 15.0 g/dL 9.9(L)  Hematocrit 36.0 - 46.0 % 31.5(L)  Platelets 150 - 400 K/uL 223    No images are attached to the encounter.  Ct Chest W Contrast  Result Date: 12/31/2017 CLINICAL DATA:  Right-sided chest pain. EXAM: CT CHEST WITH CONTRAST TECHNIQUE: Multidetector CT imaging of the chest was performed during intravenous contrast administration. CONTRAST:  70m ISOVUE-300 IOPAMIDOL (ISOVUE-300) INJECTION 61% COMPARISON:  Radiographs of November 03, 2017. CT scan of August 30, 2015. FINDINGS: Cardiovascular: Atherosclerosis of thoracic aorta is noted without aneurysm or dissection. Coronary artery calcifications are noted. Normal cardiac size. No pericardial effusion is noted. Mediastinum/Nodes: Thyroid gland is unremarkable. Moderate size sliding-type hiatal hernia is noted. 1.8 cm right hilar lymph node is noted. Lungs/Pleura: No pneumothorax or pleural effusion is noted. Multiple small nodular densities are noted throughout both lungs concerning for metastatic disease. 13 x 11 mm irregular density is noted posteriorly in the left upper lobe best seen on image number 49 of series 3 consistent with malignancy. Stable right basilar scarring is noted. New right upper lobe airspace opacity is noted which may represent pneumonia, but scarring can not be excluded. Some degree of bronchiectasis is noted in this  area as well. Upper Abdomen: No acute abnormality. Musculoskeletal: No chest wall abnormality.  No acute or significant osseous findings. IMPRESSION: 13 x 11 mm irregular density is noted in left upper lobe concerning for malignancy. Also noted are multiple small faint nodular densities throughout both lungs concerning for metastatic disease. 1.8 cm right hilar lymph node is noted concerning for metastatic disease. PET scan is recommended for further evaluation. These results will be called to the ordering clinician or representative by the Radiologist Assistant, and communication documented in the PACS or zVision Dashboard. Irregular right upper lobe airspace opacity is noted which may represent pneumonia, but scarring or fibrosis can not be excluded. Some degree of fibrosis is noted in this area. Moderate size sliding-type hiatal hernia. Coronary artery calcifications are noted. Aortic Atherosclerosis (ICD10-I70.0). Electronically Signed   By: Marijo Conception, M.D.   On: 12/31/2017 16:31   Nm Pet Image Initial (pi) Skull Base To Thigh  Result Date: 01/12/2018 CLINICAL DATA:  Initial treatment strategy for lung nodule. EXAM: NUCLEAR MEDICINE PET SKULL BASE TO THIGH TECHNIQUE: 6.0 mCi F-18 FDG was injected intravenously. Full-ring PET imaging was performed from the skull base to thigh after the radiotracer. CT data was obtained and used for attenuation correction and anatomic localization. Fasting blood glucose: 90 mg/dl COMPARISON:  Chest CT 12/31/2017 FINDINGS: Mediastinal blood pool activity: SUV max 2.3 NECK: No hypermetabolic lymph nodes in the neck. Incidental CT findings: none CHEST: 11 mm posterior left upper lobe pulmonary nodule shows low level FDG accumulation with SUV max = 2.4. There is diffuse low level uptake in the right upper lobe consolidation with airway impaction and tree-in-bud nodularity. Additional tiny bilateral pulmonary nodules are too small to reliably characterize by PET imaging.  Incidental CT findings: Coronary artery calcification is evident. Atherosclerotic calcification is noted in the wall of the thoracic aorta. Small nodule noted left thyroid lobe without hypermetabolism. Esophagus appears patulous. ABDOMEN/PELVIS: Hypermetabolism is identified in the region of the esophagogastric junction. Circumferential wall thickening in the distal esophagus and esophagogastric junction. Incidental CT findings: 15 mm dense calcification in the region of the pancreatic tail may be related to splenic artery saccular aneurysm. There is abdominal aortic atherosclerosis without aneurysm. 16 mm staghorn calculus noted lower pole left kidney with 4 mm stone identified upper pole left kidney. SKELETON: No focal hypermetabolic activity to suggest skeletal metastasis. Incidental CT findings: none IMPRESSION: 1. 11 mm posterior left upper lobe pulmonary nodule shows low level hypermetabolism. Given the findings in the right lung, this may represent infectious/inflammatory etiology by low-grade or well differentiated neoplasm not excluded. Close follow-up recommended. 2. Similar appearance of relatively diffuse airspace consolidation with airway impaction and tree-in-bud opacity in the right upper lobe. Imaging features suspicious for infectious etiology and atypical infection would be a distinct consideration. This area shows diffuse low level FDG accumulation, not unexpected. 3. Wall thickening and low level FDG accumulation in the distal esophagus and esophagogastric junction. Curvilinear high density material in this region identified but no reported surgical history of fundoplication to suggest that this represents pledgets. As such, the finding is likely mineralization of indeterminate etiology. Given hypermetabolism in this region, upper endoscopy may be warranted to further evaluate. 4. Left nephrolithiasis. 5.  Aortic Atherosclerois (ICD10-170.0) Electronically Signed   By: Misty Stanley M.D.   On:  01/12/2018 10:34     Assessment and plan- Patient is a 85 y.o. female with following issues:  1.  Left upper lobe lung nodule: I have personally reviewed PET/CT scan images independently.  We also discussed her scans at tumor board last  week. There is a very low degree of uptake of 2.4 in her 11 mm left upper lobe nodule and it is unclear if it represents infection versus malignancy.  The diffuse uptake seen in the right upper lobe has features of consolidation and tree-in-bud nodularity likely representing an infectious/inflammatory process.  Dr. Patsey Berthold was aware of the tumor board thought this could also be like an atypical MAC infection. I have discussed all these findings with the patient and her family today.  At this time I will hold off on biopsy of the left upper lobe nodule.  I will plan to get a repeat CT chest without contrast in 3 months time.  Patient recently completed a course of antibiotics for possible pneumonia and I will therefore refer her to pulmonary to follow-up on these CT and PET scan findings as well.  2.  Patient was noted to have some nonspecific uptake and wall thickening in the distal esophagus and GE junction.  She has a history of achalasia and has undergone surgery in the past.  I have asked her to discuss these findings with her GI but we will also follow this up on her subsequent CT scan  3.  Patient reports frequent sinus drainage and has been having sinus congestion as well as headaches because of that.  I will therefore refer her to ENT for the same  I will see her back in 3 months with CT chest without contrast done prior   Visit Diagnosis 1. Nodule of left lung   2. Abnormal CT scan of lung   3. Abnormal PET scan of lung   4. Sinus drainage      Dr. Randa Evens, MD, MPH [facility] [phone] 01/20/2018 1:51 PM

## 2018-01-20 NOTE — Progress Notes (Signed)
Here for PET results. Pain inttermittently in R lower back and hip area.When that pain is present she rates it a 3/10. Appetite is normal, small appetite. Feels fatigued withlow energy. Denies dyspnea.

## 2018-01-21 ENCOUNTER — Telehealth: Payer: Self-pay | Admitting: *Deleted

## 2018-01-21 NOTE — Telephone Encounter (Signed)
Patient to let her know that we do have a pulmonary appointment set up for her and that skin to be January 22 at 245 to see Dr. Ashby Dawes.  Patient given instructions how to get there and she will make the appointment.

## 2018-01-25 ENCOUNTER — Telehealth: Payer: Self-pay | Admitting: *Deleted

## 2018-01-25 NOTE — Telephone Encounter (Signed)
Called patient to let her know that ENT had given her an appointment for January 27 at 230 to see 1 of the PA.  Patient agreeable and had seen Dr. Richardson Landry in the past so she knows where the location is.  Will make the appointment

## 2018-01-26 ENCOUNTER — Ambulatory Visit: Payer: PPO | Admitting: Internal Medicine

## 2018-01-26 ENCOUNTER — Other Ambulatory Visit
Admission: RE | Admit: 2018-01-26 | Discharge: 2018-01-26 | Disposition: A | Discharge status: Home / Self Care | Payer: PPO | Source: Ambulatory Visit | Attending: Internal Medicine | Admitting: Internal Medicine

## 2018-01-26 ENCOUNTER — Encounter: Payer: Self-pay | Admitting: Internal Medicine

## 2018-01-26 VITALS — BP 108/60 | HR 69 | Resp 16 | Ht 63.5 in | Wt 118.0 lb

## 2018-01-26 DIAGNOSIS — R918 Other nonspecific abnormal finding of lung field: Secondary | ICD-10-CM

## 2018-01-26 DIAGNOSIS — R911 Solitary pulmonary nodule: Secondary | ICD-10-CM | POA: Insufficient documentation

## 2018-01-26 MED ORDER — OMEPRAZOLE 20 MG PO CPDR: 20.0000 mg | DELAYED_RELEASE_CAPSULE | Freq: Every day | ORAL | 11 refills | Status: DC

## 2018-01-26 NOTE — Patient Instructions (Addendum)
Will repeat Cat scan in about 3 months and see you back then.  Will send for blood work.  Your trouble swallowing increases your chance of pneumonia, will start on omeprazole daily.

## 2018-01-26 NOTE — Progress Notes (Signed)
[facility] Consultation      Assessment and Plan:  Right upper lobe consolidation.  Possible etiologies include scarring from recent infection, from recurrent GERD, atypical or chronic infection. - Low level uptake in this area, suggestive of chronic or recent infection. Low level uptake seen on the recent PET scan is reassuring however indolent/bronchoalveolar cancer cannot be ruled out.  - Given her age and relative frailty with deconditioning she would be at high risk for complications with bronchoscopy, therefore we discussed a conservative approach with repeat CT chest. - Of the etiologies include chronic infection such as MAC, immunological diseases such as rheumatoid arthritis, sarcoidosis. -We will check ACE level and rheumatoid factor.  Repeat CT chest in 3 months.  Deconditioning/debility. - Though patient still does activities of daily living, she notes that she is not able to walk around the supermarket because she feels that she tires out (fatigue for more than dyspnea). -Suspect that she would be at higher risk for complications with invasive procedures.  GERD/dysphagia. -Patient has a history of achalasia for which she underwent surgery.  However she still develops occasional symptoms of difficulty swallowing and has to force herself to vomit, this happens about once a month. - We will start omeprazole once daily.  Goals of care. - When asked if she would like chemotherapy if we diagnosed her with cancer she thinks she does not want that.  We discussed that if she does not want anything done it would likely make little sense to proceed with continued surveillance and biopsy.  Currently she has another CAT scan scheduled in 3 months, encouraged to think about what her wishes would be as that can affect how we decide to proceed.  Orders Placed This Encounter  Procedures  . Angiotensin converting enzyme  . Rheumatoid factor   Return in about 3 months  (around 04/27/2018) for after CT scan.    Date: 01/26/2018  MRN# [id number] ELSY Bird [birth year]   Deanna [facility] is a 85 y.o. old female seen in consultation for chief complaint of:    Chief Complaint  Patient presents with  . Consult    Referred by Dr. Janese Banks for sinus/left lung nodule.    HPI:   She notes that she had a spell with stabbing right chest pain in September of '19. Then came back in October, went to the [facility] and was admitted for 2 nights of pneumonia. She felt better, but she still has an occasional pain.  She was then referred to oncology due to leukocytosis, She had a follow up CT chest in December which showed a left lung nodule and right lung scarring/atelectasis in the right lung. She then went to a PET scan in January which showed low level uptake. She has lost about 50-60 lbs in past 2 lost.   She thinks that she is active, she lives at home with her husband, she still drives and cooks daily. She can not walk around a supermarket because she become "tired" and has to sit down. She does not feel winded at those times.  She has never been diagnosed with respiratory disease such as COPD or asthma. She last smoked about 55 yrs ago, she smoked 1 pack per week for about a year. She has a dog at home, not in bedroom.  She has a bit of reflux, she had an operation for achalasia. She notes that on 2 occasions she could not swallow and had to force herself to vomit. That  was twice in November, and once the other night.  She wakes twice per night with coughing with hoarseness. She attributes this to her sinus She also has an appointment to see ENT for sinus issues.   **CT chest 12/31/2017>> imaging personally viewed, compared with previous.  There is a right upper lobe infiltrate along the minor fissure, with some strandy atelectasis.  The area of strandy atelectasis was seen on previous film on 08/30/2015, however the right upper lobe infiltrates appear to be new.  Previously  seen infiltrate along the major fissure now appears resolved.  There is a new left upper lobe nodule not seen previously.  There remains a hiatal hernia.  There is increased size of right hilar lymphadenopathy. PET scan 01/12/2018 was also reviewed, showed similar findings, with low-level uptake in the above abnormal areas.   PMHX:   Past Medical History:  Diagnosis Date  . Achalasia   . Barrett's esophagus with dysplasia   . Diabetes mellitus without complication ([place])   . GERD (gastroesophageal reflux disease)   . Hyperlipidemia   . Hypertension   . Kidney cancer, primary, with metastasis from kidney to other site [facility])    Son  . MRSA infection    lung  . Renal insufficiency   . Thyroid disease    Surgical Hx:  Past Surgical History:  Procedure Laterality Date  . ABDOMINAL HYSTERECTOMY     complete  . achalasia    . APPENDECTOMY    . HIATAL HERNIA REPAIR    . KNEE ARTHROPLASTY Right 04/01/2016   Procedure: COMPUTER ASSISTED TOTAL KNEE ARTHROPLASTY;  Surgeon: Dereck Leep, MD;  Location: [facility] ORS;  Service: Orthopedics;  Laterality: Right;  . KNEE ARTHROPLASTY Left 11/30/2016   Procedure: COMPUTER ASSISTED TOTAL KNEE ARTHROPLASTY;  Surgeon: Dereck Leep, MD;  Location: [facility] ORS;  Service: Orthopedics;  Laterality: Left;  . PARATHYROIDECTOMY    . THYROID EXPLORATION    . TONSILLECTOMY     Family Hx:  Family History  Problem Relation Age of Onset  . Heart disease Mother   . Diabetes Father   . Lung cancer Sister   . Diabetes Sister   . Stomach cancer Brother   . CAD Brother   . Kidney cancer Son    Social Hx:   Social History   Tobacco Use  . Smoking status: Former Smoker    Packs/day: 0.25    Years: 1.00    Pack years: 0.25    Last attempt to quit: 10/13/1962    Years since quitting: 55.3  . Smokeless tobacco: Never Used  . Tobacco comment: smoked one pack a week for 1 year - 55 years ago  Substance Use Topics  . Alcohol use: No  . Drug use: No    Medication:    Current Outpatient Medications:  .  Cholecalciferol 10000 units TABS, Take 1 tablet by mouth daily before supper., Disp: , Rfl:  .  fenofibrate micronized (LOFIBRA) 134 MG capsule, Take 1 capsule (134 mg total) by mouth daily before breakfast., Disp: 30 capsule, Rfl: 11 .  Fenofibric Acid 105 MG TABS, Take 1 tablet (105 mg total) by mouth daily., Disp: 30 tablet, Rfl: 12 .  ferrous sulfate 325 (65 FE) MG tablet, Take 325 mg by mouth 2 (two) times daily., Disp: , Rfl:  .  glucose blood (ONE TOUCH ULTRA TEST) test strip, 1 each by Other route as directed. , Disp: , Rfl:  .  hydrocortisone (ANUSOL-HC) 2.5 % rectal cream, Place  1 application rectally 2 (two) times daily., Disp: 30 g, Rfl: 0 .  loratadine (CLARITIN) 10 MG tablet, Take 10 mg by mouth daily., Disp: , Rfl:  .  losartan-hydrochlorothiazide (HYZAAR) 100-25 MG tablet, Take 1 tablet by mouth daily., Disp: 30 tablet, Rfl: 12 .  metoprolol succinate (TOPROL-XL) 100 MG 24 hr tablet, Take 1 tablet (100 mg total) by mouth daily. Take with or immediately following a meal., Disp: 30 tablet, Rfl: 12 .  OMEGA-3 FATTY ACIDS PO, Take by mouth., Disp: , Rfl:    Allergies:  Procaine  Review of Systems: Gen:  Denies  fever, sweats, chills HEENT: Denies blurred vision, double vision. bleeds, sore throat Cvc:  No dizziness, chest pain. Resp:   Denies cough or sputum production, shortness of breath Gi: Denies swallowing difficulty, stomach pain. Gu:  Denies bladder incontinence, burning urine Ext:   No Joint pain, stiffness. Skin: No skin rash,  hives  Endoc:  No polyuria, polydipsia. Psych: No depression, insomnia. Other:  All other systems were reviewed with the patient and were negative other that what is mentioned in the HPI.   Physical Examination:   VS: BP 108/60 (BP Location: Left Arm, Cuff Size: Normal)   Pulse 69   Resp 16   Ht 5' 3.5" (1.613 m)   Wt 118 lb (53.5 kg)   SpO2 98%   BMI 20.57 kg/m   General  Appearance: No distress  Neuro:without focal findings,  speech normal,  HEENT: PERRLA, EOM intact.   Pulmonary: normal breath sounds, No wheezing.  CardiovascularNormal S1,S2.  No m/r/g.   Abdomen: Benign, Soft, non-tender. Renal:  No costovertebral tenderness  GU:  No performed at this time. Endoc: No evident thyromegaly, no signs of acromegaly. Skin:   warm, no rashes, no ecchymosis  Extremities: normal, no cyanosis, clubbing.  Other findings:    LABORATORY PANEL:   CBC No results for input(s): WBC, HGB, HCT, PLT in the last 168 hours. ------------------------------------------------------------------------------------------------------------------  Chemistries  No results for input(s): NA, K, CL, CO2, GLUCOSE, BUN, CREATININE, CALCIUM, MG, AST, ALT, ALKPHOS, BILITOT in the last 168 hours.  Invalid input(s): GFRCGP ------------------------------------------------------------------------------------------------------------------  Cardiac Enzymes No results for input(s): TROPONINI in the last 168 hours. ------------------------------------------------------------  RADIOLOGY:  No results found.     Thank  you for the consultation and for allowing [facility] to assist in the care of your patient. Our recommendations are noted above.  Please contact us if we can be of further service.   Marda Stalker, M.D., F.C.C.P.  Board Certified in Internal Medicine, Pulmonary Medicine, [place], and Sleep Medicine.  [facility] Office Number: [phone]   01/26/2018

## 2018-01-27 LAB — ANGIOTENSIN CONVERTING ENZYME: Angiotensin-Converting Enzyme: 44 U/L (ref 14–82)

## 2018-02-01 DIAGNOSIS — R0982 Postnasal drip: Secondary | ICD-10-CM | POA: Diagnosis not present

## 2018-02-01 DIAGNOSIS — J019 Acute sinusitis, unspecified: Secondary | ICD-10-CM | POA: Diagnosis not present

## 2018-02-10 ENCOUNTER — Telehealth: Payer: Self-pay | Admitting: Internal Medicine

## 2018-02-10 NOTE — Telephone Encounter (Signed)
Pt aware of labs. She will have RA labs drawn.

## 2018-02-10 NOTE — Telephone Encounter (Signed)
ACE level normal. Rheumatoid factor not back, did they draw it?

## 2018-02-15 ENCOUNTER — Other Ambulatory Visit
Admission: RE | Admit: 2018-02-15 | Discharge: 2018-02-15 | Disposition: A | Discharge status: Home / Self Care | Payer: PPO | Source: Ambulatory Visit | Attending: Internal Medicine | Admitting: Internal Medicine

## 2018-02-15 DIAGNOSIS — J019 Acute sinusitis, unspecified: Secondary | ICD-10-CM | POA: Diagnosis not present

## 2018-02-15 DIAGNOSIS — R918 Other nonspecific abnormal finding of lung field: Secondary | ICD-10-CM

## 2018-02-15 DIAGNOSIS — R911 Solitary pulmonary nodule: Secondary | ICD-10-CM | POA: Diagnosis not present

## 2018-02-15 DIAGNOSIS — R0982 Postnasal drip: Secondary | ICD-10-CM | POA: Diagnosis not present

## 2018-02-16 LAB — RHEUMATOID FACTOR: Rheumatoid fact SerPl-aCnc: <10 IU/mL (ref 0.0–13.9)

## 2018-02-25 NOTE — Telephone Encounter (Signed)
Pt calling back in reference to lab work. Stated that she did not get full results the last time someone called her.

## 2018-02-28 NOTE — Telephone Encounter (Signed)
rhematoid factor negative.

## 2018-03-01 NOTE — Telephone Encounter (Signed)
Pt aware that all labs are normal. Nothing further needed.

## 2018-03-07 ENCOUNTER — Other Ambulatory Visit: Payer: PPO

## 2018-03-07 ENCOUNTER — Ambulatory Visit: Payer: PPO | Admitting: Oncology

## 2018-03-08 DIAGNOSIS — J300 Vasomotor rhinitis: Secondary | ICD-10-CM | POA: Diagnosis not present

## 2018-03-08 DIAGNOSIS — J019 Acute sinusitis, unspecified: Secondary | ICD-10-CM | POA: Diagnosis not present

## 2018-04-21 ENCOUNTER — Ambulatory Visit: Payer: PPO

## 2018-04-22 ENCOUNTER — Ambulatory Visit: Payer: PPO | Admitting: Oncology

## 2018-05-09 DIAGNOSIS — L570 Actinic keratosis: Secondary | ICD-10-CM | POA: Diagnosis not present

## 2018-05-09 DIAGNOSIS — L578 Other skin changes due to chronic exposure to nonionizing radiation: Secondary | ICD-10-CM | POA: Diagnosis not present

## 2018-06-14 ENCOUNTER — Ambulatory Visit: Payer: PPO | Admitting: Oncology

## 2018-06-14 DIAGNOSIS — E119 Type 2 diabetes mellitus without complications: Secondary | ICD-10-CM | POA: Diagnosis not present

## 2018-06-16 ENCOUNTER — Other Ambulatory Visit: Payer: Self-pay

## 2018-06-16 ENCOUNTER — Ambulatory Visit
Admission: RE | Admit: 2018-06-16 | Discharge: 2018-06-16 | Disposition: A | Discharge status: Home / Self Care | Payer: PPO | Source: Ambulatory Visit | Attending: Oncology | Admitting: Oncology

## 2018-06-16 DIAGNOSIS — I2510 Atherosclerotic heart disease of native coronary artery without angina pectoris: Secondary | ICD-10-CM | POA: Diagnosis not present

## 2018-06-16 DIAGNOSIS — I700 Atherosclerosis of aorta: Secondary | ICD-10-CM | POA: Diagnosis not present

## 2018-06-16 DIAGNOSIS — R918 Other nonspecific abnormal finding of lung field: Secondary | ICD-10-CM | POA: Diagnosis not present

## 2018-06-16 DIAGNOSIS — K449 Diaphragmatic hernia without obstruction or gangrene: Secondary | ICD-10-CM | POA: Diagnosis not present

## 2018-06-16 DIAGNOSIS — R942 Abnormal results of pulmonary function studies: Secondary | ICD-10-CM | POA: Insufficient documentation

## 2018-06-16 DIAGNOSIS — R911 Solitary pulmonary nodule: Secondary | ICD-10-CM

## 2018-06-17 ENCOUNTER — Other Ambulatory Visit: Payer: Self-pay

## 2018-06-20 ENCOUNTER — Inpatient Hospital Stay: Payer: PPO | Attending: Oncology | Admitting: Oncology

## 2018-06-20 ENCOUNTER — Encounter: Payer: Self-pay | Admitting: Oncology

## 2018-06-20 DIAGNOSIS — I10 Essential (primary) hypertension: Secondary | ICD-10-CM | POA: Diagnosis not present

## 2018-06-20 DIAGNOSIS — R918 Other nonspecific abnormal finding of lung field: Secondary | ICD-10-CM | POA: Diagnosis not present

## 2018-06-20 DIAGNOSIS — D509 Iron deficiency anemia, unspecified: Secondary | ICD-10-CM | POA: Diagnosis not present

## 2018-06-20 DIAGNOSIS — E119 Type 2 diabetes mellitus without complications: Secondary | ICD-10-CM | POA: Diagnosis not present

## 2018-06-20 DIAGNOSIS — R9389 Abnormal findings on diagnostic imaging of other specified body structures: Secondary | ICD-10-CM

## 2018-06-20 DIAGNOSIS — Z79899 Other long term (current) drug therapy: Secondary | ICD-10-CM | POA: Diagnosis not present

## 2018-06-20 DIAGNOSIS — Z87891 Personal history of nicotine dependence: Secondary | ICD-10-CM

## 2018-06-20 NOTE — Progress Notes (Signed)
I connected with Deanna [facility] on 06/20/18 at 10:00 AM EDT by telephone visit and verified that I am speaking with the correct person using two identifiers.   I discussed the limitations, risks, security and privacy concerns of performing an evaluation and management service by telemedicine and the availability of in-person appointments. I also discussed with the patient that there may be a patient responsible charge related to this service. The patient expressed understanding and agreed to proceed.  Other persons participating in the visit and their role in the encounter:  none  Patient's location:  home Provider's location:  home  Chief Complaint:  discusss ct scan results  History of present illness:  Patient is a 85 year old female who has a remote history of smoking. She smoked 1 pack/week for about 1 year and that was 50 years ago. She initially saw me for her leukocytosis and work-up revealed a reactive cause. Patient also recovered from a recent bout of pneumonia and was given 10 days of Augmentin.Patient underwent CT chest with contrast on 12/31/2017 which showed an irregular density in the left upper lobe 13 x 11 mm concerning for malignancies. Multiple small faint nodular densities throughout the lungs concerning for metastatic disease. 1.8 cm right hilar lymph node concerning for metastatic disease. Irregular right upper lobe airspace opacity which may represent pneumonia or scarring or fibrosis  PET CT scan showed 11 mm posterior left upper lobe pulmonary nodule with a low uptake of 2.8.Given the findings in the right lung this may be infectious/inflammatory etiology while low-grade or well-differentiated neoplasm not excluded.  Diffuse airspace consolidation with airway impaction and tree-in-bud opacity in the right upper lobe.  Imaging features suspicious for infectious etiology and atypical infection would be a consideration.  Wall thickening and low-level FDG accumulation in the  distal esophagus of indeterminate etiology an upper endoscopy may be warranted   Interval history: she feels well. Denies any complaints today. Denies any fever, cough or sputum production   Review of Systems  Constitutional: Negative for chills, fever, malaise/fatigue and weight loss.  HENT: Negative for congestion, ear discharge and nosebleeds.   Eyes: Negative for blurred vision.  Respiratory: Negative for cough, hemoptysis, sputum production, shortness of breath and wheezing.   Cardiovascular: Negative for chest pain, palpitations, orthopnea and claudication.  Gastrointestinal: Negative for abdominal pain, blood in stool, constipation, diarrhea, heartburn, melena, nausea and vomiting.  Genitourinary: Negative for dysuria, flank pain, frequency, hematuria and urgency.  Musculoskeletal: Negative for back pain, joint pain and myalgias.  Skin: Negative for rash.  Neurological: Negative for dizziness, tingling, focal weakness, seizures, weakness and headaches.  Endo/Heme/Allergies: Does not bruise/bleed easily.  Psychiatric/Behavioral: Negative for depression and suicidal ideas. The patient does not have insomnia.     Allergies  Allergen Reactions  . Procaine Palpitations and Other (See Comments)    Rapid heartbeat    Past Medical History:  Diagnosis Date  . Achalasia   . Barrett's esophagus with dysplasia   . Diabetes mellitus without complication ([place])   . GERD (gastroesophageal reflux disease)   . Hyperlipidemia   . Hypertension   . Kidney cancer, primary, with metastasis from kidney to other site [facility])    Son  . MRSA infection    lung  . Renal insufficiency   . Thyroid disease     Past Surgical History:  Procedure Laterality Date  . ABDOMINAL HYSTERECTOMY     complete  . achalasia    . APPENDECTOMY    . HIATAL HERNIA REPAIR    .  KNEE ARTHROPLASTY Right 04/01/2016   Procedure: COMPUTER ASSISTED TOTAL KNEE ARTHROPLASTY;  Surgeon: Dereck Leep, MD;  Location: [facility]  ORS;  Service: Orthopedics;  Laterality: Right;  . KNEE ARTHROPLASTY Left 11/30/2016   Procedure: COMPUTER ASSISTED TOTAL KNEE ARTHROPLASTY;  Surgeon: Dereck Leep, MD;  Location: [facility] ORS;  Service: Orthopedics;  Laterality: Left;  . PARATHYROIDECTOMY    . THYROID EXPLORATION    . TONSILLECTOMY      Social History   Socioeconomic History  . Marital status: Married    Spouse name: Not on file  . Number of children: Not on file  . Years of education: 65  . Highest education level: High school graduate  Occupational History  . Not on file  Social Needs  . Financial resource strain: Not hard at all  . Food insecurity    Worry: Never true    Inability: Never true  . Transportation needs    Medical: No    Non-medical: No  Tobacco Use  . Smoking status: Former Smoker    Packs/day: 0.25    Years: 1.00    Pack years: 0.25    Quit date: 10/13/1962    Years since quitting: 55.7  . Smokeless tobacco: Never Used  . Tobacco comment: smoked one pack a week for 1 year - 55 years ago  Substance and Sexual Activity  . Alcohol use: No  . Drug use: No  . Sexual activity: Not Currently  Lifestyle  . Physical activity    Days per week: 0 days    Minutes per session: 0 min  . Stress: Not at all  Relationships  . Social connections    Talks on phone: More than three times a week    Gets together: More than three times a week    Attends religious service: More than 4 times per year    Active member of club or organization: No    Attends meetings of clubs or organizations: Never    Relationship status: Married  . Intimate partner violence    Fear of current or ex partner: No    Emotionally abused: No    Physically abused: No    Forced sexual activity: No  Other Topics Concern  . Not on file  Social History Narrative  . Not on file    Family History  Problem Relation Age of Onset  . Heart disease Mother   . Diabetes Father   . Lung cancer Sister   . Diabetes Sister   .  Stomach cancer Brother   . CAD Brother   . Kidney cancer Son      Current Outpatient Medications:  .  Cholecalciferol 10000 units TABS, Take 1 tablet by mouth daily before supper., Disp: , Rfl:  .  fenofibrate micronized (LOFIBRA) 134 MG capsule, Take 1 capsule (134 mg total) by mouth daily before breakfast., Disp: 30 capsule, Rfl: 11 .  Fenofibric Acid 105 MG TABS, Take 1 tablet (105 mg total) by mouth daily., Disp: 30 tablet, Rfl: 12 .  ferrous sulfate 325 (65 FE) MG tablet, Take 325 mg by mouth 2 (two) times daily., Disp: , Rfl:  .  hydrocortisone (ANUSOL-HC) 2.5 % rectal cream, Place 1 application rectally 2 (two) times daily., Disp: 30 g, Rfl: 0 .  losartan-hydrochlorothiazide (HYZAAR) 100-25 MG tablet, Take 1 tablet by mouth daily., Disp: 30 tablet, Rfl: 12 .  metoprolol succinate (TOPROL-XL) 100 MG 24 hr tablet, Take 1 tablet (100 mg total) by mouth daily. Take  with or immediately following a meal., Disp: 30 tablet, Rfl: 12 .  OMEGA-3 FATTY ACIDS PO, Take 1 tablet by mouth daily. , Disp: , Rfl:   Ct Chest Wo Contrast  Result Date: 06/16/2018 CLINICAL DATA:  Follow-up pulmonary nodule EXAM: CT CHEST WITHOUT CONTRAST TECHNIQUE: Multidetector CT imaging of the chest was performed following the standard protocol without IV contrast. COMPARISON:  PET-CT, 01/12/2018, CT chest, 12/31/2017 FINDINGS: Cardiovascular: Aortic atherosclerosis. Three-vessel coronary artery calcification. Normal heart size. No pericardial effusion. Mediastinum/Nodes: No enlarged mediastinal, hilar, or axillary lymph nodes. Small hiatal hernia. Thyroid gland, trachea, and esophagus demonstrate no significant findings. Lungs/Pleura: A previously seen left upper lobe irregular nodule is nearly resolved (series 3, image 46). There is redemonstrated, extensive centrilobular ground-glass and tree-in-bud opacity and consolidation most conspicuously involving the right upper lobe although seen throughout. There is a background of  innumerable small diffusely distributed ground-glass pulmonary nodules unchanged from prior. No pleural effusion or pneumothorax. Upper Abdomen: No acute abnormality. Musculoskeletal: No chest wall mass or suspicious bone lesions identified. IMPRESSION: 1. A previously seen left upper lobe irregular nodule is nearly resolved (series 3, image 46). Findings are consistent with resolving infection or inflammation. 2. There is redemonstrated, extensive centrilobular ground-glass and tree-in-bud opacity and consolidation most conspicuously involving the right upper lobe although seen throughout, not significantly changed. Findings are consistent with atypical infection, particularly atypical mycobacterium. 3. There is a background of innumerable small diffusely distributed ground-glass pulmonary nodules unchanged from prior, nonspecific infectious or inflammatory. 4.  Coronary artery disease and aortic atherosclerosis. 5.  Hiatal hernia. Electronically Signed   By: Eddie Candle M.D.   On: 06/16/2018 14:00    No images are attached to the encounter.   CMP Latest Ref Rng & Units 12/31/2017  Glucose 70 - 99 mg/dL -  BUN 8 - 23 mg/dL -  Creatinine 0.44 - 1.00 mg/dL 1.30(H)  Sodium 135 - 145 mmol/L -  Potassium 3.5 - 5.1 mmol/L -  Chloride 98 - 111 mmol/L -  CO2 22 - 32 mmol/L -  Calcium 8.9 - 10.3 mg/dL -  Total Protein 6.5 - 8.1 g/dL -  Total Bilirubin 0.3 - 1.2 mg/dL -  Alkaline Phos 38 - 126 U/L -  AST 15 - 41 U/L -  ALT 0 - 44 U/L -   CBC Latest Ref Rng & Units 11/30/2017  WBC 4.0 - 10.5 K/uL 9.5  Hemoglobin 12.0 - 15.0 g/dL 9.9(L)  Hematocrit 36.0 - 46.0 % 31.5(L)  Platelets 150 - 400 K/uL 223     Assessment and plan:patient is a 85 yr old female with following issues:  1. Leucocytosis- recent labs from June 2020 reviewed. Wbc has now normalized. Continue to monitor  2. H/o iron deficiency anemia: she is on oral iron. Repeat cbc ferritin and iron studies in 6 months  3. Abnormal CT  chest: I have reviewed ct scan images independently. Discussed findings with the patient. LUL nodule has resolved. She continues to have findings of non specific inflammation/ tree inbud opacities in RUL of uncertain etiology. She is asymptomatic. I did touch base with Dr. Ashby Dawes regarding furthe rmanagemnt. He will get in touchw ith her  Follow-up instructions:  I discussed the assessment and treatment plan with the patient. The patient was provided an opportunity to ask questions and all were answered. The patient agreed with the plan and demonstrated an understanding of the instructions.   The patient was advised to call back or seek an in-person evaluation if  the symptoms worsen or if the condition fails to improve as anticipated.  I provided 15 minutes of non face-to-face telephone visit time during this encounter, and > 50% was spent counseling as documented under my assessment & plan.  Visit Diagnosis: 1. Abnormal CT of the chest   2. Iron deficiency anemia, unspecified iron deficiency anemia type     Dr. Randa Evens, MD, MPH [facility] Pager- [phone] 06/20/2018 12:07 PM

## 2018-06-20 NOTE — Progress Notes (Signed)
Pt had recent ct scan to f/u on lung nodule, she is not having any pain today. Doing good per patient and anxious about scan results

## 2018-06-21 ENCOUNTER — Telehealth: Payer: Self-pay | Admitting: *Deleted

## 2018-06-21 NOTE — Telephone Encounter (Signed)
Dr. Ashby Dawes will arrange an appointment with her. He said he will take care of it. If she does not hear from him this week please ask her to give his office a call.

## 2018-06-21 NOTE — Telephone Encounter (Signed)
Patient reports she spoke with Dr Janese Banks yesterday who told her she would discuss her CT with her pulmonologist Dr Juanell Fairly and said she would call her back but did not say when she would call her back, patient requests a call back today around 5 PM if possible

## 2018-06-21 NOTE — Telephone Encounter (Signed)
Patient informed of physician response and will call us back Monday if she has not heard from Dr Harless Litten office

## 2018-06-22 DIAGNOSIS — N183 Chronic kidney disease, stage 3 (moderate): Secondary | ICD-10-CM | POA: Diagnosis not present

## 2018-06-22 DIAGNOSIS — Z0000 Encounter for general adult medical examination without abnormal findings: Secondary | ICD-10-CM | POA: Diagnosis not present

## 2018-06-22 DIAGNOSIS — K2270 Barrett's esophagus without dysplasia: Secondary | ICD-10-CM | POA: Diagnosis not present

## 2018-06-22 DIAGNOSIS — I10 Essential (primary) hypertension: Secondary | ICD-10-CM | POA: Diagnosis not present

## 2018-06-22 DIAGNOSIS — E119 Type 2 diabetes mellitus without complications: Secondary | ICD-10-CM | POA: Diagnosis not present

## 2018-06-22 NOTE — Progress Notes (Addendum)
[facility] Consultation     Virtual Visit via Telephone Note I connected with patient on 06/23/18 at 11:30 AM EDT by telephone and verified that I am speaking with the correct person using two identifiers.   I discussed the limitations, risks of performing an evaluation and management service by telephone and the availability of in person appointments. I also discussed with the patient that there may be a patient responsible charge related to this service.  In light of current covid-19 pandemic, patient also understands that we are trying to protect them by minimizing in office contact if at all possible.  The patient expressed understanding and agreed to proceed. Please see note below for further detail.    The patient was advised to call back or seek an in-person evaluation if the symptoms worsen or if the condition fails to improve as anticipated.   Laverle Hobby, MD   Assessment and Plan:  Right upper lobe infiltrates, possible etiologies include scarring from recent infection, recurrent GERD, atypical or chronic infection.  An indolent malignancy remains possible. - Low level uptake in this area, suggestive of chronic or recent infection. Low level uptake seen on the recent PET scan is reassuring however indolent/bronchoalveolar cancer cannot be ruled out.  - Given her age and relative frailty with deconditioning she would be at high risk for complications with bronchoscopy. - Review of most recent imaging in comparison to previous shows stable to slightly decreased size of nodular infiltrate.  Discussed that I feel that the likelihood that this is cancer is low, and does not warrant the risk of biopsy at this time. - We discussed the possibility of continued surveillance, she maintains that she does not want chemotherapy or radiation at this time.  She would prefer not to undergo continued surveillance imaging, but will call us back if she has any new  concerns, repeat imaging could be ordered at that time.  Deconditioning/debility. - Though patient still does activities of daily living, she notes that she is not able to walk around the supermarket because she feels that she tires out (fatigue for more than dyspnea). -Suspect that she would be at higher risk for complications with invasive procedures.  GERD/dysphagia. -Patient has a history of achalasia for which she underwent surgery.  However she still develops occasional symptoms of difficulty swallowing and has to force herself to vomit, this happens about once a month. - Continue omeprazole.   Return if symptoms worsen or fail to improve.    Date: 06/22/2018  MRN# [id number] Deanna Bird [birth year]   Deanna Bird is a 85 y.o. old female seen in consultation for chief complaint of: lung nodule.     HPI:   She notes that she had a spell with stabbing right chest pain in September of '19. Then came back in October, went to the hospital and was admitted for 2 nights of pneumonia. She felt better, but she still has an occasional pain.  She was then referred to oncology due to leukocytosis, She had a follow up CT chest in December which showed a left lung nodule and right lung scarring/atelectasis in the right lung. She then went to a PET scan in January which showed low level uptake.  Patient was noted to be fairly active, living with her husband with continued driving and living independently.  She does note that she becomes tired and walk around a supermarket, but does not feel winded.  She has a history of reflux, previous  operation for achalasia.  Never been diagnosed with respiratory problems in the past, last smoked about 55 years ago, about 1 pack/week for a year.  At last visit she was asked to use omeprazole.  She was seen by oncology and had a repeat PET scan, which I reviewed.  This shows stable to slightly decreased right lung infiltrate/nodule size.  Patient otherwise has  no new complaints, she denies any weight loss, cough, hemoptysis.     **CT chest 06/16/2018 in comparison with previous on 12/31/2017>> images personally viewed, upper and middle lobe bronchiectatic changes persist.  Right upper lobe infiltrates appear to  be slightly improved.  Hiatal hernia persists.  Tiny multiple nodules throughout both lungs which appear to be groundglass, unchanged from previous. **CT chest 12/31/2017>> imaging personally viewed, compared with previous.  There is a right upper lobe infiltrate along the minor fissure, with some strandy atelectasis.  The area of strandy atelectasis was seen on previous film on 08/30/2015, however the right upper lobe infiltrates appear to be new.  Previously seen infiltrate along the major fissure now appears resolved.  There is a new left upper lobe nodule not seen previously.  There remains a hiatal hernia.  There is increased size of right hilar lymphadenopathy. PET scan 01/12/2018 was also reviewed, showed similar findings, with low-level uptake in the above abnormal areas.   Medication:    Current Outpatient Medications:    Cholecalciferol 10000 units TABS, Take 1 tablet by mouth daily before supper., Disp: , Rfl:    fenofibrate micronized (LOFIBRA) 134 MG capsule, Take 1 capsule (134 mg total) by mouth daily before breakfast., Disp: 30 capsule, Rfl: 11   Fenofibric Acid 105 MG TABS, Take 1 tablet (105 mg total) by mouth daily., Disp: 30 tablet, Rfl: 12   ferrous sulfate 325 (65 FE) MG tablet, Take 325 mg by mouth 2 (two) times daily., Disp: , Rfl:    hydrocortisone (ANUSOL-HC) 2.5 % rectal cream, Place 1 application rectally 2 (two) times daily., Disp: 30 g, Rfl: 0   losartan-hydrochlorothiazide (HYZAAR) 100-25 MG tablet, Take 1 tablet by mouth daily., Disp: 30 tablet, Rfl: 12   metoprolol succinate (TOPROL-XL) 100 MG 24 hr tablet, Take 1 tablet (100 mg total) by mouth daily. Take with or immediately following a meal., Disp: 30 tablet,  Rfl: 12   OMEGA-3 FATTY ACIDS PO, Take 1 tablet by mouth daily. , Disp: , Rfl:    Allergies:  Procaine  Review of Systems:  Constitutional: Feels well. Cardiovascular: Denies chest pain, exertional chest pain.  Pulmonary: Denies hemoptysis, pleuritic chest pain.   The remainder of systems were reviewed and were found to be negative other than what is documented in the HPI.        LABORATORY PANEL:   CBC No results for input(s): WBC, HGB, HCT, PLT in the last 168 hours. ------------------------------------------------------------------------------------------------------------------  Chemistries  No results for input(s): NA, K, CL, CO2, GLUCOSE, BUN, CREATININE, CALCIUM, MG, AST, ALT, ALKPHOS, BILITOT in the last 168 hours.  Invalid input(s): GFRCGP ------------------------------------------------------------------------------------------------------------------  Cardiac Enzymes No results for input(s): TROPONINI in the last 168 hours. ------------------------------------------------------------  RADIOLOGY:  No results found.     Thank  you for the consultation and for allowing [facility] to assist in the care of your patient. Our recommendations are noted above.  Please contact us if we can be of further service.   Marda Stalker, M.D., F.C.C.P.  Board Certified in Internal Medicine, Pulmonary Medicine, [place], and  Sleep Medicine.  [facility] Office Number: [phone]   06/22/2018

## 2018-06-23 ENCOUNTER — Ambulatory Visit (INDEPENDENT_AMBULATORY_CARE_PROVIDER_SITE_OTHER): Payer: PPO | Admitting: Internal Medicine

## 2018-06-23 DIAGNOSIS — R911 Solitary pulmonary nodule: Secondary | ICD-10-CM

## 2018-06-23 NOTE — Patient Instructions (Signed)
Continue to try to be active, as that is the best thing that you can do to keep your lungs healthy.  Call us back if you have any respiratory problems.

## 2018-07-25 ENCOUNTER — Ambulatory Visit: Payer: PPO

## 2018-08-15 DIAGNOSIS — L570 Actinic keratosis: Secondary | ICD-10-CM | POA: Diagnosis not present

## 2018-08-15 DIAGNOSIS — L578 Other skin changes due to chronic exposure to nonionizing radiation: Secondary | ICD-10-CM | POA: Diagnosis not present

## 2018-08-15 DIAGNOSIS — D692 Other nonthrombocytopenic purpura: Secondary | ICD-10-CM | POA: Diagnosis not present

## 2018-08-15 DIAGNOSIS — L738 Other specified follicular disorders: Secondary | ICD-10-CM | POA: Diagnosis not present

## 2018-09-01 ENCOUNTER — Other Ambulatory Visit: Payer: Self-pay | Admitting: Family Medicine

## 2018-09-01 DIAGNOSIS — I10 Essential (primary) hypertension: Secondary | ICD-10-CM

## 2018-09-01 NOTE — Telephone Encounter (Signed)
Requested medication (s) are due for refill today: yes  Requested medication (s) are on the active medication list: yes  Last refill:  07/29/2018  Future visit scheduled: no  Notes to clinic:  Review for refill  Requested Prescriptions  Pending Prescriptions Disp Refills   metoprolol succinate (TOPROL-XL) 100 MG 24 hr tablet [Pharmacy Med Name: METOPROLOL SUCC ER 100 MG TAB] 30 tablet 0    Sig: Take 1 tablet (100 mg total) by mouth daily. Take with or immediatelyfollowing a meal.     Cardiovascular:  Beta Blockers Failed - 09/01/2018 11:40 AM      Failed - Valid encounter within last 6 months    Recent Outpatient Visits          9 months ago Pneumonia due to infectious organism, unspecified laterality, unspecified part of lung   [facility] [place], Jeannette How, MD   1 year ago Essential hypertension   [facility] Crissman, Jeannette How, MD   1 year ago Viral upper respiratory tract infection   [place] Criss[place], Jeannette How, MD   1 year ago Essential hypertension   [place], Jeannette How, MD   2 years ago Essential hypertension   [place], Jeannette How, MD             Passed - Last BP in normal range    BP Readings from Last 1 Encounters:  01/26/18 108/60         Passed - Last Heart Rate in normal range    Pulse Readings from Last 1 Encounters:  01/26/18 69

## 2018-09-01 NOTE — Telephone Encounter (Signed)
Routing to provider  

## 2018-10-19 DIAGNOSIS — K640 First degree hemorrhoids: Secondary | ICD-10-CM | POA: Diagnosis not present

## 2018-10-19 DIAGNOSIS — K644 Residual hemorrhoidal skin tags: Secondary | ICD-10-CM | POA: Diagnosis not present

## 2018-11-15 DIAGNOSIS — E119 Type 2 diabetes mellitus without complications: Secondary | ICD-10-CM | POA: Diagnosis not present

## 2018-11-22 DIAGNOSIS — E119 Type 2 diabetes mellitus without complications: Secondary | ICD-10-CM | POA: Diagnosis not present

## 2018-11-22 DIAGNOSIS — K219 Gastro-esophageal reflux disease without esophagitis: Secondary | ICD-10-CM | POA: Diagnosis not present

## 2018-11-22 DIAGNOSIS — R413 Other amnesia: Secondary | ICD-10-CM | POA: Diagnosis not present

## 2018-11-22 DIAGNOSIS — N183 Chronic kidney disease, stage 3 unspecified: Secondary | ICD-10-CM | POA: Diagnosis not present

## 2018-11-22 DIAGNOSIS — I10 Essential (primary) hypertension: Secondary | ICD-10-CM | POA: Diagnosis not present

## 2018-11-30 ENCOUNTER — Other Ambulatory Visit: Payer: Self-pay

## 2018-12-09 DIAGNOSIS — J019 Acute sinusitis, unspecified: Secondary | ICD-10-CM | POA: Diagnosis not present

## 2018-12-09 DIAGNOSIS — R0789 Other chest pain: Secondary | ICD-10-CM | POA: Diagnosis not present

## 2018-12-20 ENCOUNTER — Inpatient Hospital Stay: Payer: PPO

## 2018-12-20 ENCOUNTER — Inpatient Hospital Stay: Payer: PPO | Admitting: Oncology

## 2019-02-15 IMAGING — CT NM PET TUM IMG INITIAL (PI) SKULL BASE T - THIGH
1 of 10 series · 1 of 25 positions shown · non-contrast
Comparison: Chest CT 12/31/2017

CLINICAL DATA: Initial treatment strategy for lung nodule.

EXAM:
NUCLEAR MEDICINE PET SKULL BASE TO THIGH
TECHNIQUE: 6.0 mCi F-18 FDG was injected intravenously. Full-ring PET imaging
was performed from the skull base to thigh after the radiotracer. CT
data was obtained and used for attenuation correction and anatomic
localization.
Fasting blood glucose: 90 mg/dl

[Series 3: ct wb 5.0 b30f · axial · 5.0mm · 0.98mm/px · 1 of 290 slices shown]
[im 290/290  brain]
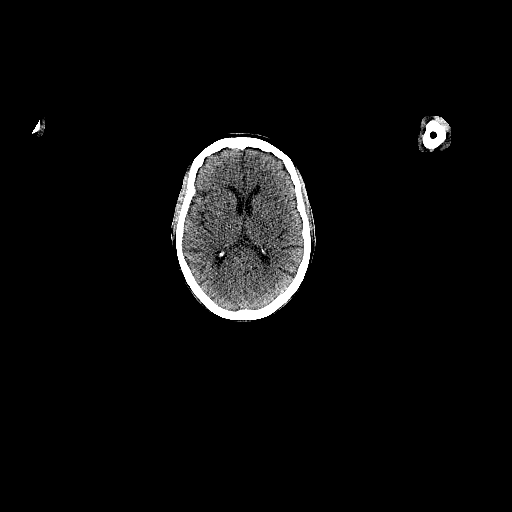

[1 of 25 positions shown; findings below may reference images not displayed]

FINDINGS: Mediastinal blood pool activity: SUV max

NECK: No hypermetabolic lymph nodes in the neck.

Incidental CT findings: none

CHEST: 11 mm posterior left upper lobe pulmonary nodule shows low
level FDG accumulation with SUV max = 2.4. There is diffuse low
level uptake in the right upper lobe consolidation with airway
impaction and tree-in-bud nodularity. Additional tiny bilateral
pulmonary nodules are too small to reliably characterize by PET
imaging.

Incidental CT findings: Coronary artery calcification is evident.
Atherosclerotic calcification is noted in the wall of the thoracic
aorta. Small nodule noted left thyroid lobe without hypermetabolism.
Esophagus appears patulous.

ABDOMEN/PELVIS: Hypermetabolism is identified in the region of the
esophagogastric junction. Circumferential wall thickening in the
distal esophagus and esophagogastric junction.

Incidental CT findings: 15 mm dense calcification in the region of
the pancreatic tail may be related to splenic artery saccular
aneurysm. There is abdominal aortic atherosclerosis without
aneurysm. 16 mm staghorn calculus noted lower pole left kidney with
4 mm stone identified upper pole left kidney.

SKELETON: No focal hypermetabolic activity to suggest skeletal
metastasis.

Incidental CT findings: none
IMPRESSION: 1. 11 mm posterior left upper lobe pulmonary nodule shows low level
hypermetabolism. Given the findings in the right lung, this may
represent infectious/inflammatory etiology by low-grade or well
differentiated neoplasm not excluded. Close follow-up recommended.
2. Similar appearance of relatively diffuse airspace consolidation
with airway impaction and tree-in-bud opacity in the right upper
lobe. Imaging features suspicious for infectious etiology and
atypical infection would be a distinct consideration. This area
shows diffuse low level FDG accumulation, not unexpected.
3. Wall thickening and low level FDG accumulation in the distal
esophagus and esophagogastric junction. Curvilinear high density
material in this region identified but no reported surgical history
of fundoplication to suggest that this represents pledgets. As such,
the finding is likely mineralization of indeterminate etiology.
Given hypermetabolism in this region, upper endoscopy may be
warranted to further evaluate.
4. Left nephrolithiasis.
5.  Aortic Atherosclerois ([id number])

## 2019-02-22 DIAGNOSIS — K219 Gastro-esophageal reflux disease without esophagitis: Secondary | ICD-10-CM | POA: Diagnosis not present

## 2019-02-22 DIAGNOSIS — E119 Type 2 diabetes mellitus without complications: Secondary | ICD-10-CM | POA: Diagnosis not present

## 2019-02-22 DIAGNOSIS — N183 Chronic kidney disease, stage 3 unspecified: Secondary | ICD-10-CM | POA: Diagnosis not present

## 2019-02-22 DIAGNOSIS — Z0000 Encounter for general adult medical examination without abnormal findings: Secondary | ICD-10-CM | POA: Diagnosis not present

## 2019-02-22 DIAGNOSIS — I10 Essential (primary) hypertension: Secondary | ICD-10-CM | POA: Diagnosis not present

## 2019-05-15 DIAGNOSIS — E119 Type 2 diabetes mellitus without complications: Secondary | ICD-10-CM | POA: Diagnosis not present

## 2019-05-15 DIAGNOSIS — N183 Chronic kidney disease, stage 3 unspecified: Secondary | ICD-10-CM | POA: Diagnosis not present

## 2019-05-17 DIAGNOSIS — D72829 Elevated white blood cell count, unspecified: Secondary | ICD-10-CM | POA: Diagnosis not present

## 2019-05-17 DIAGNOSIS — K219 Gastro-esophageal reflux disease without esophagitis: Secondary | ICD-10-CM | POA: Diagnosis not present

## 2019-05-17 DIAGNOSIS — I10 Essential (primary) hypertension: Secondary | ICD-10-CM | POA: Diagnosis not present

## 2019-05-17 DIAGNOSIS — N3001 Acute cystitis with hematuria: Secondary | ICD-10-CM | POA: Diagnosis not present

## 2019-05-17 DIAGNOSIS — E119 Type 2 diabetes mellitus without complications: Secondary | ICD-10-CM | POA: Diagnosis not present

## 2019-05-17 DIAGNOSIS — N183 Chronic kidney disease, stage 3 unspecified: Secondary | ICD-10-CM | POA: Diagnosis not present

## 2019-06-28 DIAGNOSIS — E119 Type 2 diabetes mellitus without complications: Secondary | ICD-10-CM | POA: Diagnosis not present

## 2019-08-10 DIAGNOSIS — E119 Type 2 diabetes mellitus without complications: Secondary | ICD-10-CM | POA: Diagnosis not present

## 2019-08-17 DIAGNOSIS — N183 Chronic kidney disease, stage 3 unspecified: Secondary | ICD-10-CM | POA: Diagnosis not present

## 2019-08-17 DIAGNOSIS — Z0001 Encounter for general adult medical examination with abnormal findings: Secondary | ICD-10-CM | POA: Diagnosis not present

## 2019-08-17 DIAGNOSIS — R05 Cough: Secondary | ICD-10-CM | POA: Diagnosis not present

## 2019-08-17 DIAGNOSIS — D509 Iron deficiency anemia, unspecified: Secondary | ICD-10-CM | POA: Diagnosis not present

## 2019-08-17 DIAGNOSIS — K219 Gastro-esophageal reflux disease without esophagitis: Secondary | ICD-10-CM | POA: Diagnosis not present

## 2019-08-17 DIAGNOSIS — J9811 Atelectasis: Secondary | ICD-10-CM | POA: Diagnosis not present

## 2019-08-17 DIAGNOSIS — I10 Essential (primary) hypertension: Secondary | ICD-10-CM | POA: Diagnosis not present

## 2019-08-17 DIAGNOSIS — E119 Type 2 diabetes mellitus without complications: Secondary | ICD-10-CM | POA: Diagnosis not present

## 2019-11-14 DIAGNOSIS — E119 Type 2 diabetes mellitus without complications: Secondary | ICD-10-CM | POA: Diagnosis not present

## 2019-11-14 DIAGNOSIS — D509 Iron deficiency anemia, unspecified: Secondary | ICD-10-CM | POA: Diagnosis not present

## 2019-11-21 DIAGNOSIS — I10 Essential (primary) hypertension: Secondary | ICD-10-CM | POA: Diagnosis not present

## 2019-11-21 DIAGNOSIS — N183 Chronic kidney disease, stage 3 unspecified: Secondary | ICD-10-CM | POA: Diagnosis not present

## 2019-11-21 DIAGNOSIS — J329 Chronic sinusitis, unspecified: Secondary | ICD-10-CM | POA: Diagnosis not present

## 2019-11-21 DIAGNOSIS — K219 Gastro-esophageal reflux disease without esophagitis: Secondary | ICD-10-CM | POA: Diagnosis not present

## 2019-11-21 DIAGNOSIS — E119 Type 2 diabetes mellitus without complications: Secondary | ICD-10-CM | POA: Diagnosis not present

## 2020-03-27 DIAGNOSIS — E119 Type 2 diabetes mellitus without complications: Secondary | ICD-10-CM | POA: Diagnosis not present

## 2020-04-03 DIAGNOSIS — N183 Chronic kidney disease, stage 3 unspecified: Secondary | ICD-10-CM | POA: Diagnosis not present

## 2020-04-03 DIAGNOSIS — K219 Gastro-esophageal reflux disease without esophagitis: Secondary | ICD-10-CM | POA: Diagnosis not present

## 2020-04-03 DIAGNOSIS — I10 Essential (primary) hypertension: Secondary | ICD-10-CM | POA: Diagnosis not present

## 2020-04-03 DIAGNOSIS — E119 Type 2 diabetes mellitus without complications: Secondary | ICD-10-CM | POA: Diagnosis not present

## 2020-04-03 DIAGNOSIS — R600 Localized edema: Secondary | ICD-10-CM | POA: Diagnosis not present

## 2020-04-03 DIAGNOSIS — Z0000 Encounter for general adult medical examination without abnormal findings: Secondary | ICD-10-CM | POA: Diagnosis not present

## 2020-04-03 DIAGNOSIS — R918 Other nonspecific abnormal finding of lung field: Secondary | ICD-10-CM | POA: Diagnosis not present

## 2020-04-08 DIAGNOSIS — R600 Localized edema: Secondary | ICD-10-CM | POA: Diagnosis not present

## 2020-06-26 DIAGNOSIS — E119 Type 2 diabetes mellitus without complications: Secondary | ICD-10-CM | POA: Diagnosis not present

## 2020-07-11 DIAGNOSIS — E119 Type 2 diabetes mellitus without complications: Secondary | ICD-10-CM | POA: Diagnosis not present

## 2020-07-11 DIAGNOSIS — K219 Gastro-esophageal reflux disease without esophagitis: Secondary | ICD-10-CM | POA: Diagnosis not present

## 2020-07-11 DIAGNOSIS — N183 Chronic kidney disease, stage 3 unspecified: Secondary | ICD-10-CM | POA: Diagnosis not present

## 2020-07-11 DIAGNOSIS — D692 Other nonthrombocytopenic purpura: Secondary | ICD-10-CM | POA: Diagnosis not present

## 2020-07-11 DIAGNOSIS — I10 Essential (primary) hypertension: Secondary | ICD-10-CM | POA: Diagnosis not present

## 2020-08-30 DIAGNOSIS — H353131 Nonexudative age-related macular degeneration, bilateral, early dry stage: Secondary | ICD-10-CM | POA: Diagnosis not present

## 2020-11-04 DIAGNOSIS — E119 Type 2 diabetes mellitus without complications: Secondary | ICD-10-CM | POA: Diagnosis not present

## 2020-11-12 DIAGNOSIS — I10 Essential (primary) hypertension: Secondary | ICD-10-CM | POA: Diagnosis not present

## 2020-11-12 DIAGNOSIS — E119 Type 2 diabetes mellitus without complications: Secondary | ICD-10-CM | POA: Diagnosis not present

## 2020-11-12 DIAGNOSIS — Z23 Encounter for immunization: Secondary | ICD-10-CM | POA: Diagnosis not present

## 2020-11-12 DIAGNOSIS — N183 Chronic kidney disease, stage 3 unspecified: Secondary | ICD-10-CM | POA: Diagnosis not present

## 2020-11-12 DIAGNOSIS — K219 Gastro-esophageal reflux disease without esophagitis: Secondary | ICD-10-CM | POA: Diagnosis not present

## 2021-03-05 DIAGNOSIS — E119 Type 2 diabetes mellitus without complications: Secondary | ICD-10-CM | POA: Diagnosis not present

## 2021-03-12 DIAGNOSIS — Z0000 Encounter for general adult medical examination without abnormal findings: Secondary | ICD-10-CM | POA: Diagnosis not present

## 2021-03-12 DIAGNOSIS — E119 Type 2 diabetes mellitus without complications: Secondary | ICD-10-CM | POA: Diagnosis not present

## 2021-03-12 DIAGNOSIS — K219 Gastro-esophageal reflux disease without esophagitis: Secondary | ICD-10-CM | POA: Diagnosis not present

## 2021-03-12 DIAGNOSIS — N183 Chronic kidney disease, stage 3 unspecified: Secondary | ICD-10-CM | POA: Diagnosis not present

## 2021-03-12 DIAGNOSIS — Z0001 Encounter for general adult medical examination with abnormal findings: Secondary | ICD-10-CM | POA: Diagnosis not present

## 2021-03-12 DIAGNOSIS — I10 Essential (primary) hypertension: Secondary | ICD-10-CM | POA: Diagnosis not present

## 2021-04-25 DIAGNOSIS — J310 Chronic rhinitis: Secondary | ICD-10-CM | POA: Diagnosis not present

## 2021-04-28 DIAGNOSIS — S6992XA Unspecified injury of left wrist, hand and finger(s), initial encounter: Secondary | ICD-10-CM | POA: Diagnosis not present

## 2021-04-28 DIAGNOSIS — M20012 Mallet finger of left finger(s): Secondary | ICD-10-CM | POA: Diagnosis not present

## 2021-05-23 DIAGNOSIS — M79645 Pain in left finger(s): Secondary | ICD-10-CM | POA: Diagnosis not present

## 2021-05-23 DIAGNOSIS — M20012 Mallet finger of left finger(s): Secondary | ICD-10-CM | POA: Diagnosis not present

## 2021-07-09 DIAGNOSIS — E119 Type 2 diabetes mellitus without complications: Secondary | ICD-10-CM | POA: Diagnosis not present

## 2021-07-16 DIAGNOSIS — K219 Gastro-esophageal reflux disease without esophagitis: Secondary | ICD-10-CM | POA: Diagnosis not present

## 2021-07-16 DIAGNOSIS — E119 Type 2 diabetes mellitus without complications: Secondary | ICD-10-CM | POA: Diagnosis not present

## 2021-07-16 DIAGNOSIS — I10 Essential (primary) hypertension: Secondary | ICD-10-CM | POA: Diagnosis not present

## 2021-07-16 DIAGNOSIS — N183 Chronic kidney disease, stage 3 unspecified: Secondary | ICD-10-CM | POA: Diagnosis not present

## 2021-07-17 DIAGNOSIS — M20012 Mallet finger of left finger(s): Secondary | ICD-10-CM | POA: Diagnosis not present

## 2021-09-01 DIAGNOSIS — E119 Type 2 diabetes mellitus without complications: Secondary | ICD-10-CM | POA: Diagnosis not present

## 2021-09-01 DIAGNOSIS — H353131 Nonexudative age-related macular degeneration, bilateral, early dry stage: Secondary | ICD-10-CM | POA: Diagnosis not present

## 2021-09-01 DIAGNOSIS — H35372 Puckering of macula, left eye: Secondary | ICD-10-CM | POA: Diagnosis not present

## 2021-09-01 DIAGNOSIS — H2513 Age-related nuclear cataract, bilateral: Secondary | ICD-10-CM | POA: Diagnosis not present

## 2021-11-11 DIAGNOSIS — E119 Type 2 diabetes mellitus without complications: Secondary | ICD-10-CM | POA: Diagnosis not present

## 2021-11-18 DIAGNOSIS — E119 Type 2 diabetes mellitus without complications: Secondary | ICD-10-CM | POA: Diagnosis not present

## 2021-11-18 DIAGNOSIS — I10 Essential (primary) hypertension: Secondary | ICD-10-CM | POA: Diagnosis not present

## 2021-11-18 DIAGNOSIS — N183 Chronic kidney disease, stage 3 unspecified: Secondary | ICD-10-CM | POA: Diagnosis not present

## 2021-11-18 DIAGNOSIS — D692 Other nonthrombocytopenic purpura: Secondary | ICD-10-CM | POA: Diagnosis not present

## 2021-11-18 DIAGNOSIS — K219 Gastro-esophageal reflux disease without esophagitis: Secondary | ICD-10-CM | POA: Diagnosis not present

## 2022-01-21 DIAGNOSIS — J310 Chronic rhinitis: Secondary | ICD-10-CM | POA: Diagnosis not present

## 2022-03-15 ENCOUNTER — Emergency Department: Payer: PPO

## 2022-03-15 ENCOUNTER — Encounter: Payer: Self-pay | Admitting: Internal Medicine

## 2022-03-15 ENCOUNTER — Other Ambulatory Visit: Payer: Self-pay

## 2022-03-15 ENCOUNTER — Inpatient Hospital Stay
Admission: EM | Admit: 2022-03-15 | Discharge: 2022-03-18 | DRG: 871 | Disposition: A | Discharge status: Hospice | Payer: PPO | Attending: Internal Medicine | Admitting: Internal Medicine

## 2022-03-15 DIAGNOSIS — Z96653 Presence of artificial knee joint, bilateral: Secondary | ICD-10-CM | POA: Diagnosis present

## 2022-03-15 DIAGNOSIS — E860 Dehydration: Secondary | ICD-10-CM | POA: Diagnosis not present

## 2022-03-15 DIAGNOSIS — Z66 Do not resuscitate: Secondary | ICD-10-CM | POA: Diagnosis present

## 2022-03-15 DIAGNOSIS — Z90710 Acquired absence of both cervix and uterus: Secondary | ICD-10-CM

## 2022-03-15 DIAGNOSIS — R197 Diarrhea, unspecified: Secondary | ICD-10-CM | POA: Diagnosis not present

## 2022-03-15 DIAGNOSIS — R531 Weakness: Principal | ICD-10-CM

## 2022-03-15 DIAGNOSIS — R627 Adult failure to thrive: Secondary | ICD-10-CM | POA: Diagnosis present

## 2022-03-15 DIAGNOSIS — A4189 Other specified sepsis: Principal | ICD-10-CM | POA: Diagnosis present

## 2022-03-15 DIAGNOSIS — I10 Essential (primary) hypertension: Secondary | ICD-10-CM | POA: Diagnosis present

## 2022-03-15 DIAGNOSIS — J470 Bronchiectasis with acute lower respiratory infection: Secondary | ICD-10-CM | POA: Diagnosis present

## 2022-03-15 DIAGNOSIS — J1282 Pneumonia due to coronavirus disease 2019: Secondary | ICD-10-CM | POA: Diagnosis not present

## 2022-03-15 DIAGNOSIS — E871 Hypo-osmolality and hyponatremia: Secondary | ICD-10-CM | POA: Diagnosis not present

## 2022-03-15 DIAGNOSIS — E785 Hyperlipidemia, unspecified: Secondary | ICD-10-CM | POA: Diagnosis not present

## 2022-03-15 DIAGNOSIS — J9601 Acute respiratory failure with hypoxia: Secondary | ICD-10-CM | POA: Diagnosis not present

## 2022-03-15 DIAGNOSIS — N200 Calculus of kidney: Secondary | ICD-10-CM | POA: Diagnosis not present

## 2022-03-15 DIAGNOSIS — Z8249 Family history of ischemic heart disease and other diseases of the circulatory system: Secondary | ICD-10-CM

## 2022-03-15 DIAGNOSIS — A419 Sepsis, unspecified organism: Secondary | ICD-10-CM | POA: Diagnosis present

## 2022-03-15 DIAGNOSIS — R0902 Hypoxemia: Secondary | ICD-10-CM

## 2022-03-15 DIAGNOSIS — G9341 Metabolic encephalopathy: Secondary | ICD-10-CM | POA: Diagnosis not present

## 2022-03-15 DIAGNOSIS — E890 Postprocedural hypothyroidism: Secondary | ICD-10-CM | POA: Diagnosis present

## 2022-03-15 DIAGNOSIS — Z8614 Personal history of Methicillin resistant Staphylococcus aureus infection: Secondary | ICD-10-CM

## 2022-03-15 DIAGNOSIS — I129 Hypertensive chronic kidney disease with stage 1 through stage 4 chronic kidney disease, or unspecified chronic kidney disease: Secondary | ICD-10-CM | POA: Diagnosis not present

## 2022-03-15 DIAGNOSIS — J189 Pneumonia, unspecified organism: Secondary | ICD-10-CM

## 2022-03-15 DIAGNOSIS — R0689 Other abnormalities of breathing: Secondary | ICD-10-CM | POA: Diagnosis not present

## 2022-03-15 DIAGNOSIS — K219 Gastro-esophageal reflux disease without esophagitis: Secondary | ICD-10-CM | POA: Diagnosis present

## 2022-03-15 DIAGNOSIS — R6520 Severe sepsis without septic shock: Secondary | ICD-10-CM | POA: Diagnosis not present

## 2022-03-15 DIAGNOSIS — K449 Diaphragmatic hernia without obstruction or gangrene: Secondary | ICD-10-CM | POA: Diagnosis present

## 2022-03-15 DIAGNOSIS — U071 COVID-19: Secondary | ICD-10-CM | POA: Insufficient documentation

## 2022-03-15 DIAGNOSIS — J9811 Atelectasis: Secondary | ICD-10-CM | POA: Diagnosis not present

## 2022-03-15 DIAGNOSIS — N1832 Chronic kidney disease, stage 3b: Secondary | ICD-10-CM | POA: Diagnosis present

## 2022-03-15 DIAGNOSIS — Z515 Encounter for palliative care: Secondary | ICD-10-CM

## 2022-03-15 DIAGNOSIS — I959 Hypotension, unspecified: Secondary | ICD-10-CM | POA: Diagnosis not present

## 2022-03-15 DIAGNOSIS — Z888 Allergy status to other drugs, medicaments and biological substances status: Secondary | ICD-10-CM

## 2022-03-15 DIAGNOSIS — Z85528 Personal history of other malignant neoplasm of kidney: Secondary | ICD-10-CM

## 2022-03-15 DIAGNOSIS — R001 Bradycardia, unspecified: Secondary | ICD-10-CM | POA: Diagnosis not present

## 2022-03-15 DIAGNOSIS — E1122 Type 2 diabetes mellitus with diabetic chronic kidney disease: Secondary | ICD-10-CM | POA: Diagnosis not present

## 2022-03-15 DIAGNOSIS — Z79899 Other long term (current) drug therapy: Secondary | ICD-10-CM

## 2022-03-15 DIAGNOSIS — J90 Pleural effusion, not elsewhere classified: Secondary | ICD-10-CM | POA: Diagnosis not present

## 2022-03-15 DIAGNOSIS — J181 Lobar pneumonia, unspecified organism: Secondary | ICD-10-CM | POA: Diagnosis not present

## 2022-03-15 DIAGNOSIS — Z87891 Personal history of nicotine dependence: Secondary | ICD-10-CM

## 2022-03-15 DIAGNOSIS — R918 Other nonspecific abnormal finding of lung field: Secondary | ICD-10-CM | POA: Diagnosis present

## 2022-03-15 DIAGNOSIS — Z833 Family history of diabetes mellitus: Secondary | ICD-10-CM

## 2022-03-15 DIAGNOSIS — N3289 Other specified disorders of bladder: Secondary | ICD-10-CM | POA: Diagnosis not present

## 2022-03-15 DIAGNOSIS — Z9049 Acquired absence of other specified parts of digestive tract: Secondary | ICD-10-CM

## 2022-03-15 DIAGNOSIS — Z7401 Bed confinement status: Secondary | ICD-10-CM | POA: Diagnosis not present

## 2022-03-15 LAB — COMPREHENSIVE METABOLIC PANEL
ALT: 18 U/L (ref 0–44)
AST: 41 U/L (ref 15–41)
Albumin: 2.2 g/dL — ABNORMAL LOW (ref 3.5–5.0)
Alkaline Phosphatase: 41 U/L (ref 38–126)
Anion gap: 11 (ref 5–15)
BUN: 43 mg/dL — ABNORMAL HIGH (ref 8–23)
CO2: 23 mmol/L (ref 22–32)
Calcium: 7.5 mg/dL — ABNORMAL LOW (ref 8.9–10.3)
Chloride: 89 mmol/L — ABNORMAL LOW (ref 98–111)
Creatinine, Ser: 1.24 mg/dL — ABNORMAL HIGH (ref 0.44–1.00)
GFR, Estimated: 41 mL/min — ABNORMAL LOW (ref >60)
Glucose, Bld: 101 mg/dL — ABNORMAL HIGH (ref 70–99)
Potassium: 3.9 mmol/L (ref 3.5–5.1)
Sodium: 123 mmol/L — ABNORMAL LOW (ref 135–145)
Total Bilirubin: 1 mg/dL (ref 0.3–1.2)
Total Protein: 6 g/dL — ABNORMAL LOW (ref 6.5–8.1)

## 2022-03-15 LAB — CBC WITH DIFFERENTIAL/PLATELET
Abs Immature Granulocytes: 0.09 10*3/uL — ABNORMAL HIGH (ref 0.00–0.07)
Basophils Absolute: 0 10*3/uL (ref 0.0–0.1)
Basophils Relative: 0 %
Eosinophils Absolute: 0.1 10*3/uL (ref 0.0–0.5)
Eosinophils Relative: 1 %
HCT: 39 % (ref 36.0–46.0)
Hemoglobin: 12.7 g/dL (ref 12.0–15.0)
Immature Granulocytes: 0 %
Lymphocytes Relative: 4 %
Lymphs Abs: 0.9 10*3/uL (ref 0.7–4.0)
MCH: 30.4 pg (ref 26.0–34.0)
MCHC: 32.6 g/dL (ref 30.0–36.0)
MCV: 93.3 fL (ref 80.0–100.0)
Monocytes Absolute: 0.6 10*3/uL (ref 0.1–1.0)
Monocytes Relative: 3 %
Neutro Abs: 19.6 10*3/uL — ABNORMAL HIGH (ref 1.7–7.7)
Neutrophils Relative %: 92 %
Platelets: 171 10*3/uL (ref 150–400)
RBC: 4.18 MIL/uL (ref 3.87–5.11)
RDW: 14.5 % (ref 11.5–15.5)
WBC: 21.3 10*3/uL — ABNORMAL HIGH (ref 4.0–10.5)
nRBC: 0 % (ref 0.0–0.2)

## 2022-03-15 LAB — SODIUM
Sodium: 125 mmol/L — ABNORMAL LOW (ref 135–145)
Sodium: 125 mmol/L — ABNORMAL LOW (ref 135–145)
Sodium: 122 mmol/L — ABNORMAL LOW (ref 135–145)
Sodium: 122 mmol/L — ABNORMAL LOW (ref 135–145)
Sodium: 122 mmol/L — ABNORMAL LOW (ref 135–145)

## 2022-03-15 LAB — LACTIC ACID, PLASMA
Lactic Acid, Venous: 2.3 mmol/L (ref 0.5–1.9)
Lactic Acid, Venous: 2.3 mmol/L (ref 0.5–1.9)

## 2022-03-15 LAB — APTT: aPTT: 38 seconds — ABNORMAL HIGH (ref 24–36)

## 2022-03-15 LAB — RESP PANEL BY RT-PCR (RSV, FLU A&B, COVID)  RVPGX2
Influenza A by PCR: NEGATIVE
Influenza B by PCR: NEGATIVE
Resp Syncytial Virus by PCR: NEGATIVE
SARS Coronavirus 2 by RT PCR: POSITIVE — AB

## 2022-03-15 LAB — OSMOLALITY: Osmolality: 278 mOsm/kg (ref 275–295)

## 2022-03-15 LAB — MRSA NEXT GEN BY PCR, NASAL: MRSA by PCR Next Gen: NOT DETECTED

## 2022-03-15 LAB — PROTIME-INR
INR: 1.1 (ref 0.8–1.2)
Prothrombin Time: 14.4 seconds (ref 11.4–15.2)

## 2022-03-15 LAB — MAGNESIUM: Magnesium: 1.8 mg/dL (ref 1.7–2.4)

## 2022-03-15 LAB — T4, FREE: Free T4: 1.26 ng/dL — ABNORMAL HIGH (ref 0.61–1.12)

## 2022-03-15 LAB — TSH: TSH: 3.019 u[IU]/mL (ref 0.350–4.500)

## 2022-03-15 LAB — PROCALCITONIN: Procalcitonin: 0.78 ng/mL

## 2022-03-15 LAB — TRIGLYCERIDES: Triglycerides: 70 mg/dL (ref <150)

## 2022-03-15 MED ORDER — HYDROCORTISONE 2.5 % RE CREA: 1.0000 | TOPICAL_CREAM | Freq: Two times a day (BID) | RECTAL | Status: DC | PRN

## 2022-03-15 MED ORDER — ENOXAPARIN SODIUM 30 MG/0.3ML IJ SOSY
30.0000 mg | PREFILLED_SYRINGE | INTRAMUSCULAR | Status: DC
  Administered 2022-03-15 – 2022-03-16 (×2): 30 mg via SUBCUTANEOUS
  Filled 2022-03-15 (×2): qty 0.3

## 2022-03-15 MED ORDER — IPRATROPIUM BROMIDE 0.03 % NA SOLN
1.0000 | Freq: Two times a day (BID) | NASAL | Status: DC
  Administered 2022-03-15 – 2022-03-17 (×4): 1 via NASAL
  Filled 2022-03-15: qty 30

## 2022-03-15 MED ORDER — SODIUM CHLORIDE 0.9 % IV SOLN
1.0000 g | Freq: Once | INTRAVENOUS | Status: AC
  Administered 2022-03-15: 1 g via INTRAVENOUS
  Filled 2022-03-15: qty 10

## 2022-03-15 MED ORDER — ACETAMINOPHEN 650 MG RE SUPP: 650.0000 mg | Freq: Four times a day (QID) | RECTAL | Status: DC | PRN

## 2022-03-15 MED ORDER — LACTATED RINGERS IV BOLUS (SEPSIS)
1000.0000 mL | Freq: Once | INTRAVENOUS | Status: AC
  Administered 2022-03-15: 1000 mL via INTRAVENOUS

## 2022-03-15 MED ORDER — SODIUM CHLORIDE 0.9 % IV BOLUS (SEPSIS)
1000.0000 mL | Freq: Once | INTRAVENOUS | Status: AC
  Administered 2022-03-15: 1000 mL via INTRAVENOUS

## 2022-03-15 MED ORDER — SODIUM CHLORIDE 0.9 % IV SOLN
2.0000 g | INTRAVENOUS | Status: DC
  Administered 2022-03-16: 2 g via INTRAVENOUS
  Filled 2022-03-15: qty 20

## 2022-03-15 MED ORDER — ENOXAPARIN SODIUM 40 MG/0.4ML IJ SOSY: 40.0000 mg | PREFILLED_SYRINGE | INTRAMUSCULAR | Status: DC

## 2022-03-15 MED ORDER — ACETAMINOPHEN 325 MG PO TABS
650.0000 mg | ORAL_TABLET | Freq: Four times a day (QID) | ORAL | Status: DC | PRN
  Administered 2022-03-17: 650 mg via ORAL
  Filled 2022-03-15: qty 2

## 2022-03-15 MED ORDER — SODIUM CHLORIDE 0.9 % IV SOLN
500.0000 mg | INTRAVENOUS | Status: DC
  Administered 2022-03-16 – 2022-03-17 (×2): 500 mg via INTRAVENOUS
  Filled 2022-03-15 (×2): qty 5

## 2022-03-15 MED ORDER — LACTATED RINGERS IV SOLN
150.0000 mL/h | INTRAVENOUS | Status: DC
  Administered 2022-03-15 (×2): 150 mL/h via INTRAVENOUS

## 2022-03-15 MED ORDER — MELATONIN 5 MG PO TABS: 5.0000 mg | ORAL_TABLET | Freq: Every evening | ORAL | Status: DC | PRN

## 2022-03-15 MED ORDER — SODIUM CHLORIDE 0.9 % IV SOLN: INTRAVENOUS | Status: DC

## 2022-03-15 MED ORDER — ONDANSETRON HCL 4 MG/2ML IJ SOLN: 4.0000 mg | Freq: Four times a day (QID) | INTRAMUSCULAR | Status: DC | PRN

## 2022-03-15 MED ORDER — HYDRALAZINE HCL 20 MG/ML IJ SOLN: 5.0000 mg | Freq: Three times a day (TID) | INTRAMUSCULAR | Status: DC | PRN

## 2022-03-15 MED ORDER — ONDANSETRON HCL 4 MG PO TABS: 4.0000 mg | ORAL_TABLET | Freq: Four times a day (QID) | ORAL | Status: DC | PRN

## 2022-03-15 MED ORDER — SODIUM CHLORIDE 0.9 % IV SOLN
500.0000 mg | Freq: Once | INTRAVENOUS | Status: AC
  Administered 2022-03-15: 500 mg via INTRAVENOUS
  Filled 2022-03-15: qty 5

## 2022-03-15 MED ORDER — SENNOSIDES-DOCUSATE SODIUM 8.6-50 MG PO TABS: 1.0000 | ORAL_TABLET | Freq: Every evening | ORAL | Status: DC | PRN

## 2022-03-15 MED ORDER — LACTATED RINGERS IV SOLN: 150.0000 mL/h | INTRAVENOUS | Status: DC

## 2022-03-15 NOTE — Progress Notes (Signed)
PHARMACIST - PHYSICIAN COMMUNICATION  CONCERNING:  Enoxaparin (Lovenox) for DVT Prophylaxis    RECOMMENDATION: Patient was prescribed enoxaprin '40mg'$  q24 hours for VTE prophylaxis.   Filed Weights   03/15/22 1002  Weight: 54.4 kg (120 lb)    Body mass index is 20.6 kg/m.  Estimated Creatinine Clearance: 25.4 mL/min (A) (by C-G formula based on SCr of 1.24 mg/dL (H)).   Patient is candidate for enoxaparin '30mg'$  every 24 hours based on CrCl <41m/min   DESCRIPTION: [facility]  Patient is now receiving enoxaparin 30 mg every 24 hours    SPernell Dupre PharmD, BCPS Clinical Pharmacist 03/15/2022 12:37 PM

## 2022-03-15 NOTE — ED Notes (Signed)
Pt states she is seeing stripes on peoples clothing as well as seeing a person on the floor.

## 2022-03-15 NOTE — H&P (Addendum)
History and Physical   Deanna Bird [id number] DOB: March 28, 1928 DOA: 03/15/2022  PCP: Baxter Hire, MD  Outpatient Specialists: Dr. Rudene Christians, orthopedic surgeon Patient coming from: Home via EMS  I have personally briefly reviewed patient's old medical records in [place].  Chief Concern: Weakness  HPI: Deanna Bird 89 year old female with history of hypertension, who presents emergency department for chief concerns of generalized weakness and diarrhea.  Vitals in the ED showed temperature of 97.4, respiration rate of 13, heart rate 68, blood pressure 157/74, SpO2 of 95% on 2 L nasal cannula.  Serum sodium is 123, potassium 3.9, chloride 89, bicarb 23, BUN of 43, serum creatinine 1.24, EGFR 41, nonfasting blood glucose 101, WBC 21.3, platelets of 171, hemoglobin 12.7.  Lactic is elevated at 2.3.  COVID PCR was positive.  ED treatment: Azithromycin 500 mg IV one-time dose, ceftriaxone 1 g IV, sodium chloride 1 L bolus. ------------------------- At bedside, patient was able to tell me her first name, and her age of 89.  She knows she is in the hospital.  She recognizes her daughter at bedside.  Per daughter at bedside, patient has been weak since 03/06/2022.  Daughter notes that she has had increasing progression of the weakness.  Daughter reports that patient also developed diarrhea and had this for 4 days.  Daughter reports the diarrhea resolved yesterday on 03/14/2022.  Daughter attribute the diarrhea due to patient taking laxative and docusate senna daily.  She denies reported fever or shortness of breath.  Daughter notes that patient had hallucination 2 days ago, patient was seeing squiggly lines, boxes on the floor, people on the floor and daughter noted that patient ran off some children who were in the room with her.  And there were no children, no boxes, no people laying on the floor.  Daughter denies head trauma and vomiting.  Social history: Patient currently lives  at home with her husband.  Patient's daughter and son-in-law recently moved in about a month ago to help care for patient and daughter step father.  Daughter reports that her mother does not smoke, use alcohol, or recreational drugs  ROS: Unable to complete as patient is acutely ill  ED Course: Discussed with emergency medicine provider, patient requiring hospitalization for chief concerns of multifocal pneumonia.  Assessment/Plan  Principal Problem:   Severe sepsis (HCC) Active Problems:   Acute respiratory failure with hypoxia (HCC)   Hyponatremia   Hyperlipidemia   Hypertension   Pneumonia   Lung mass   Diarrhea   COVID-19 virus infection   Assessment and Plan:  * Severe sepsis [facility]) Patient meets criteria for severe sepsis with elevated lactic acid, markedly elevated leukocytosis, increased respiration rate, organ involvement is pulmonary Blood cultures x 2 are in process, will follow second lactic acid Check procalcitonin, MRSA PCR, C. difficile screening, GI panel Continue with azithromycin 500 mg daily, ceftriaxone 2 g IV daily to complete 5-day course Status post sodium chloride 1 L bolus per EDP Ordered LR 1 L bolus on admission and LR 150 mL/h continuous for 16 hours Admit to PCU, inpatient  Hyponatremia Symptomatic, check serum osmolality, urine osmolality, urine sodium level, triglyceride level Goal increase in serum sodium level is a 6-8 in 24 hours Recheck serum sodium level every 2 hours to ensure slow improvement, 3 checks ordered Admit to PCU  Acute respiratory failure with hypoxia (HCC) spO2 was 88% on RA, presumed secondary to multi focal pneumonia and COVID-19 infection Continue oxygen supplementation to maintain  SpO2 greater than 92%  Diarrhea Query gastroenteritis secondary to COVID-19, symptomatic support Check C. difficile, GI panel  Pneumonia Azithromycin and ceftriaxone as above Incentive spirometry and flutter valve, every 2 hours while  awake  Hypertension Home losartan-hydrochlorothiazide 100-25 daily, metoprolol succinate 100 mg daily not resumed on admission; AM team to resume when the benefits outweigh the risk Hydralazine 5 mg IV every 8 hours as needed for SBP greater than 180, 5 days ordered  AM team to complete med reconciliation  Chart reviewed.   DVT prophylaxis: enoxaparin Code Status: DNR, confirmed with daughter at bedside.  Daughter reports that patient has told daughter that patient does not want to be resuscitated and would like to be allowed to pass naturally should her heart stop beating Diet: heart healthy Family Communication: Updated daughter, Deanna Bird who, at bedside Disposition Plan: pending clinical course Consults called: none at this time Admission status: inpatient, pcu  Past Medical History:  Diagnosis Date   Achalasia    Barrett's esophagus with dysplasia    Diabetes mellitus without complication ([place])    GERD (gastroesophageal reflux disease)    Hyperlipidemia    Hypertension    Kidney cancer, primary, with metastasis from kidney to other site [facility])    Son   MRSA infection    lung   Renal insufficiency    Thyroid disease    Past Surgical History:  Procedure Laterality Date   ABDOMINAL HYSTERECTOMY     complete   achalasia     APPENDECTOMY     HIATAL HERNIA REPAIR     KNEE ARTHROPLASTY Right 04/01/2016   Procedure: COMPUTER ASSISTED TOTAL KNEE ARTHROPLASTY;  Surgeon: Dereck Leep, MD;  Location: [facility] ORS;  Service: Orthopedics;  Laterality: Right;   KNEE ARTHROPLASTY Left 11/30/2016   Procedure: COMPUTER ASSISTED TOTAL KNEE ARTHROPLASTY;  Surgeon: Dereck Leep, MD;  Location: [facility] ORS;  Service: Orthopedics;  Laterality: Left;   PARATHYROIDECTOMY     THYROID EXPLORATION     TONSILLECTOMY     Social History:  reports that she quit smoking about 59 years ago. She has a 0.25 pack-year smoking history. She has never used smokeless tobacco. She reports that she does not  drink alcohol and does not use drugs.  Allergies  Allergen Reactions   Procaine Palpitations and Other (See Comments)    Rapid heartbeat   Family History  Problem Relation Age of Onset   Heart disease Mother    Diabetes Father    Lung cancer Sister    Diabetes Sister    Stomach cancer Brother    CAD Brother    Kidney cancer Son    Family history: Family history reviewed and not pertinent.  Prior to Admission medications   Medication Sig Start Date End Date Taking? Authorizing Provider  Cholecalciferol 10000 units TABS Take 1 tablet by mouth daily before supper.    [provider]  fenofibrate micronized (LOFIBRA) 134 MG capsule Take 1 capsule (134 mg total) by mouth daily before breakfast. 12/15/17 12/10/18  Guadalupe Maple, MD  Fenofibric Acid 105 MG TABS Take 1 tablet (105 mg total) by mouth daily. 08/10/17   Guadalupe Maple, MD  ferrous sulfate 325 (65 FE) MG tablet Take 325 mg by mouth 2 (two) times daily.    [provider]  hydrocortisone (ANUSOL-HC) 2.5 % rectal cream Place 1 application rectally 2 (two) times daily. 10/21/17   Guadalupe Maple, MD  losartan-hydrochlorothiazide (HYZAAR) 100-25 MG tablet Take 1 tablet by  mouth daily. 08/10/17   Guadalupe Maple, MD  metoprolol succinate (TOPROL-XL) 100 MG 24 hr tablet Take 1 tablet (100 mg total) by mouth daily. Take with or immediatelyfollowing a meal. 09/03/18   Crissman, Jeannette How, MD  OMEGA-3 FATTY ACIDS PO Take 1 tablet by mouth daily.     [provider]   Physical Exam: Vitals:   03/15/22 1145 03/15/22 1200 03/15/22 1245 03/15/22 1400  BP:    119/64  Pulse: 74 77 75 93  Resp: 20 (!) 25 (!) 22 (!) 22  Temp:      TempSrc:      SpO2: 96% 94% 97% 97%  Weight:      Height:       Constitutional: appears frail, NAD, calm, comfortable Eyes: PERRL, lids and conjunctivae normal ENMT: Mucous membranes are moist. Posterior pharynx clear of any exudate or lesions. Age-appropriate dentition. Hearing  loss Neck: normal, supple, no masses, no thyromegaly Respiratory: clear to auscultation bilaterally, no wheezing, no crackles. Normal respiratory effort. No accessory muscle use.  Cardiovascular: Regular rate and rhythm, no murmurs / rubs / gallops. No extremity edema. 2+ pedal pulses. No carotid bruits.  Abdomen: no tenderness, no masses palpated, no hepatosplenomegaly. Bowel sounds positive.  Musculoskeletal: no clubbing / cyanosis. No joint deformity upper and lower extremities. Good ROM, no contractures, no atrophy. Normal muscle tone.  Skin: no rashes, lesions, ulcers. No induration Neurologic: Sensation intact. Strength 5/5 in all 4.  Psychiatric: Normal judgment and insight. Alert and oriented x 3. Normal mood.   EKG: independently reviewed, showing sinus rhythm with rate of 67, QTc 443  Chest x-ray on Admission: I personally reviewed and I agree with radiologist reading as below.  DG Chest Port 1 View  Result Date: 03/15/2022 CLINICAL DATA:  Sepsis EXAM: PORTABLE CHEST 1 VIEW COMPARISON:  11/03/2017 FINDINGS: Normal heart size. Aortic atherosclerosis. Extensive patchy airspace opacities bilaterally, worse within the right lung. New focal density at the medial right lung apex measuring approximately 3.7 x 3.1 cm. No pneumothorax. IMPRESSION: 1. Extensive patchy airspace opacities bilaterally, worse within the right lung, most suspicious for multifocal pneumonia. 2. New focal density at the medial right lung apex measuring approximately 3.7 x 3.1 cm. Differential includes loculated pleural fluid or mass lesion. Follow-up CT of the chest, preferably with contrast, is suggested to further evaluate. Electronically Signed   By: Davina Poke D.O.   On: 03/15/2022 10:49    Labs on Admission: I have personally reviewed following labs  CBC: Recent Labs  Lab 03/15/22 1022  WBC 21.3*  NEUTROABS 19.6*  HGB 12.7  HCT 39.0  MCV 93.3  PLT [id number]   Basic Metabolic Panel: Recent Labs  Lab  03/15/22 1022 03/15/22 1308  NA 123* 125*  K 3.9  --   CL 89*  --   CO2 23  --   GLUCOSE 101*  --   BUN 43*  --   CREATININE 1.24*  --   CALCIUM 7.5*  --   MG 1.8  --    GFR: Estimated Creatinine Clearance: 25.4 mL/min (A) (by C-G formula based on SCr of 1.24 mg/dL (H)).  Liver Function Tests: Recent Labs  Lab 03/15/22 1022  AST 41  ALT 18  ALKPHOS 41  BILITOT 1.0  PROT 6.0*  ALBUMIN 2.2*   Thyroid Function Tests: Recent Labs    03/15/22 1022  TSH 3.019  FREET4 1.26*   Anemia Panel: No results for input(s): "VITAMINB12", "FOLATE", "FERRITIN", "TIBC", "IRON", "RETICCTPCT" in  the last 72 hours.  Urine analysis:    Component Value Date/Time   COLORURINE YELLOW (A) 11/19/2016 0937   APPEARANCEUR Clear 08/10/2017 1011   LABSPEC 1.015 11/19/2016 0937   PHURINE 6.0 11/19/2016 0937   GLUCOSEU Negative 08/10/2017 1011   HGBUR NEGATIVE 11/19/2016 0937   BILIRUBINUR Negative 08/10/2017 1011   KETONESUR NEGATIVE 11/19/2016 0937   PROTEINUR Negative 08/10/2017 1011   PROTEINUR NEGATIVE 11/19/2016 0937   NITRITE Negative 08/10/2017 1011   NITRITE NEGATIVE 11/19/2016 0937   LEUKOCYTESUR Trace (A) 08/10/2017 1011   CRITICAL CARE Performed by: Dr. Tobie Poet  Total critical care time: 35 minutes  Critical care time was exclusive of separately billable procedures and treating other patients.  Critical care was necessary to treat or prevent imminent or life-threatening deterioration.  Severe sepsis, hyponatremia  Critical care was time spent personally by me on the following activities: development of treatment plan with patient and/or surrogate as well as nursing, discussions with consultants, evaluation of patient's response to treatment, examination of patient, obtaining history from patient or surrogate, ordering and performing treatments and interventions, ordering and review of laboratory studies, ordering and review of radiographic studies, pulse oximetry and re-evaluation  of patient's condition.  This document was prepared using Dragon Voice Recognition software and may include unintentional dictation errors.  Dr. Tobie Poet [facility]  If 7PM-7AM, please contact overnight-coverage provider If 7AM-7PM, please contact day coverage provider [email or web address]  03/15/2022, 3:48 PM

## 2022-03-15 NOTE — ED Notes (Signed)
Pt up to restroom needing two person assist. Pt attempting to provide urine and stool sample.

## 2022-03-15 NOTE — ED Triage Notes (Addendum)
Pt brought to ED via [facility] from home for generalized weakness and diarrhea x3 days. Pt is normally able to walk around but for last 3 days has been bed bound. Per EMS her wet cough is normal for her. Pt is Aox4 and at baseline. Upon EMS arrival to pt's house O2 sats at 88-89%, placed on 2L [place] and rose to 96%. Pt denies any CP/abdominal pain.

## 2022-03-15 NOTE — ED Notes (Signed)
Advised nurse that patient has ready bed 

## 2022-03-15 NOTE — Assessment & Plan Note (Addendum)
Azithromycin and ceftriaxone as above Incentive spirometry and flutter valve, every 2 hours while awake

## 2022-03-15 NOTE — Hospital Course (Signed)
Deanna Bird 89 year old female with history of hypertension, who presents emergency department for chief concerns of generalized weakness and diarrhea.  Vitals in the ED showed temperature of 97.4, respiration rate of 13, heart rate 68, blood pressure 157/74, SpO2 of 95% on 2 L nasal cannula.  Serum sodium is 123, potassium 3.9, chloride 89, bicarb 23, BUN of 43, serum creatinine 1.24, EGFR 41, nonfasting blood glucose 101, WBC 21.3, platelets of 171, hemoglobin 12.7.  Lactic is elevated at 2.3.  COVID PCR was positive.  ED treatment: Azithromycin 500 mg IV one-time dose, ceftriaxone 1 g IV, sodium chloride 1 L bolus.

## 2022-03-15 NOTE — Assessment & Plan Note (Addendum)
Home losartan-hydrochlorothiazide 100-25 daily, metoprolol succinate 100 mg daily not resumed on admission; AM team to resume when the benefits outweigh the risk Hydralazine 5 mg IV every 8 hours as needed for SBP greater than 180, 5 days ordered

## 2022-03-15 NOTE — Assessment & Plan Note (Addendum)
Symptomatic, check serum osmolality, urine osmolality, urine sodium level, triglyceride level Goal increase in serum sodium level is a 6-8 in 24 hours Recheck serum sodium level every 2 hours to ensure slow improvement, 3 checks ordered Admit to PCU

## 2022-03-15 NOTE — Progress Notes (Addendum)
I reviewed serum sodium that resulted at 1809, serum sodium was 125  Hyponatremia-status post sodium chloride 1 L bolus and LR 1 L bolus - LR 150 mL/h infusion will be discontinued - Ordered sodium chloride infusion at 100 mL/h, 1 day ordered - Continue serum sodium check every 2 hours, starting at 1900, 4 additional checks ordered - Discussed with nursing via secure chat  Dr. Tobie Poet

## 2022-03-15 NOTE — ED Notes (Signed)
Pt was able to provide small sample of each specimen however, specimens contaminated due to being in same container. Will attempt another collection later.

## 2022-03-15 NOTE — Progress Notes (Incomplete)
       CROSS COVER NOTE  NAME: Deanna Bird MRN: [id number] DOB : 1928/09/21 ATTENDING PHYSICIAN: Cox, Briant Cedar, DO    Date of Service   03/15/2022   HPI/Events of Note   Report *** On Review of chart *** Bedside eval*** HPI***  Interventions   Assessment/Plan: Increase NS to 125 mL/hr X X    *** professional thanks      To reach the provider On-Call:   7AM- 7PM see care teams to locate the attending and reach out to them via [email or web address] [email or web address] 7PM-7AM contact night-coverage If you still have difficulty reaching the appropriate provider, please page the [facility] (Director on Call) for [facility] on amion for assistance  This document was prepared using Systems analyst and may include unintentional dictation errors.  Neomia Glass DNP, MBA, FNP-BC, PMHNP-BC Nurse Practitioner [facility] Pager [phone]

## 2022-03-15 NOTE — ED Notes (Signed)
Pt states her last BM was two days ago, 03/13/22 and was normal.

## 2022-03-15 NOTE — ED Provider Notes (Signed)
[facility] Provider Note    Event Date/Time   First MD Initiated Contact with Patient 03/15/22 1000     (approximate)   History   Weakness   HPI  Deanna Bird is a 89 y.o. female   Past medical history of diabetes, hypertension, hyperlipidemia, thyroid disease, GERD, who presents to the emergency department with generalized weakness and fatigue.  She is usually able to perform activities of daily living and ambulate without assistance at home, lives with daughter, but in the past week she had had profuse watery diarrhea which has now resolved over the last 2 days, very poor p.o. intake over the last 1 week, and is now fatigued and generalized weakness to the point where she cannot get out of bed.  She also has not a chronic cough but has had increased sputum production over the last week and is found to be hypoxemic in the high 80s on room air.   She has had no nausea or vomiting.  Denies dysuria or frequency.  No fever or chills.  Denies trauma or falls.  She appears dehydrated.  Her vital signs are normal.  She is afebrile.  She requires 2 L of nasal cannula to get her oxygen saturations into the 90s.  Independent Historian contributed to assessment above: Daughter who is at bedside  External Medical Documents Reviewed: Office visit internal medicine from November 2023 addressing her hypertension, diabetes, GERD and CKD      Physical Exam   Triage Vital Signs: ED Triage Vitals  Enc Vitals Group     BP 03/15/22 1000 (!) 157/74     Pulse Rate 03/15/22 1005 65     Resp 03/15/22 1000 13     Temp 03/15/22 1001 (!) 97.4 F (36.3 C)     Temp Source 03/15/22 1001 Oral     SpO2 03/15/22 1005 95 %     Weight 03/15/22 1002 120 lb (54.4 kg)     Height 03/15/22 1002 '5\' 4"'$  (1.626 m)     Head Circumference --      Peak Flow --      Pain Score 03/15/22 1001 0     Pain Loc --      Pain Edu? --      Excl. in [place]? --     Most recent vital signs: Vitals:    03/15/22 1001 03/15/22 1005  BP:    Pulse:  65  Resp:    Temp: (!) 97.4 F (36.3 C)   SpO2:  95%    General: Awake, no distress.  CV:  Appears dehydrated with poor skin turgor and dry mucous membranes Resp:  Normal effort.  Abd:  No distention.  Nontender to palpation, soft no rigidity Other:  Vital signs within normal limits slightly hypertensive but her oxygen saturation is in the mid to high 80s on room air she has a productive sounding cough however her lungs are without focality or wheezing or rales   ED Results / Procedures / Treatments   Labs (all labs ordered are listed, but only abnormal results are displayed) Labs Reviewed  CULTURE, BLOOD (ROUTINE X 2)  CULTURE, BLOOD (ROUTINE X 2)  RESP PANEL BY RT-PCR (RSV, FLU A&B, COVID)  [id number]  LACTIC ACID, PLASMA  LACTIC ACID, PLASMA  COMPREHENSIVE METABOLIC PANEL  CBC WITH DIFFERENTIAL/PLATELET  PROTIME-INR  APTT  URINALYSIS, W/ REFLEX TO CULTURE (INFECTION SUSPECTED)  TSH  T4, FREE  MAGNESIUM      EKG  ED ECG REPORT I, Lucillie Garfinkel, the attending physician, personally viewed and interpreted this ECG.   Date: 03/15/2022  EKG Time: 1000  Rate: 67  Rhythm: nsr  Axis: nl  Intervals: normal QTC  ST&T Change: No acute ischemic changes    RADIOLOGY I independently reviewed and interpreted chest x-ray and see some opacification in the right middle lobe concerning for pneumonia in the setting of productive cough and hypoxemia   PROCEDURES:  Critical Care performed: Yes, see critical care procedure note(s)  .Critical Care  Performed by: Lucillie Garfinkel, MD Authorized by: Lucillie Garfinkel, MD   Critical care provider statement:    Critical care time (minutes):  30   Critical care was time spent personally by me on the following activities:  Development of treatment plan with patient or surrogate, discussions with consultants, evaluation of patient's response to treatment, examination of patient, ordering and review  of laboratory studies, ordering and review of radiographic studies, ordering and performing treatments and interventions, pulse oximetry, re-evaluation of patient's condition and review of old charts    MEDICATIONS [place] ED: Medications  sodium chloride 0.9 % bolus 1,000 mL (has no administration in time range)  azithromycin (ZITHROMAX) 500 mg in sodium chloride 0.9 % 250 mL IVPB (has no administration in time range)  cefTRIAXone (ROCEPHIN) 1 g in sodium chloride 0.9 % 100 mL IVPB (has no administration in time range)    External physician / consultants:  I spoke with hospitalist for admission and regarding care plan for this patient.   IMPRESSION / MDM / ASSESSMENT AND PLAN / ED COURSE  I reviewed the triage vital signs and the nursing notes.                                Patient's presentation is most consistent with acute presentation with potential threat to life or bodily function.  Differential diagnosis includes, but is not limited to, dehydration, electrolyte disturbance, colitis, gastroenteritis, intra-abdominal infection.  For her productive cough and hypoxemia bacterial pneumonia, respiratory infection, viral URI, sepsis   The patient is on the cardiac monitor to evaluate for evidence of arrhythmia and/or significant heart rate changes.  MDM: This is a patient with diarrhea that has now resolved and ongoing weakness and appears dehydrated will get IV crystalloid bolus and check electrolytes and replete as needed.  She has a productive cough and new oxygen requirement will check chest x-ray and viral swabs.  Chest x-ray on my interpretation has some opacification in the right middle lobe so I will give her community-acquired pneumonia coverage.  Vital signs stable.  CT scan of the abdomen pelvis to assess for intra-abdominal infection surgical pathology is pending.  Will require admission for her dehydration as well as her new oxygen requirement in the setting of  suspected pneumonia.        FINAL CLINICAL IMPRESSION(S) / ED DIAGNOSES   Final diagnoses:  Generalized weakness  Diarrhea, unspecified type  Pneumonia due to infectious organism, unspecified laterality, unspecified part of lung  Hypoxemia     Rx / DC Orders   ED Discharge Orders     None        Note:  This document was prepared using Dragon voice recognition software and may include unintentional dictation errors.    Lucillie Garfinkel, MD 03/15/22 [phone]

## 2022-03-15 NOTE — Assessment & Plan Note (Signed)
Query gastroenteritis secondary to COVID-19, symptomatic support Check C. difficile, GI panel

## 2022-03-15 NOTE — Assessment & Plan Note (Addendum)
spO2 was 88% on RA, presumed secondary to multi focal pneumonia and COVID-19 infection Continue oxygen supplementation to maintain SpO2 greater than 92%

## 2022-03-15 NOTE — Assessment & Plan Note (Addendum)
Patient meets criteria for severe sepsis with elevated lactic acid, markedly elevated leukocytosis, increased respiration rate, organ involvement is pulmonary Blood cultures x 2 are in process, will follow second lactic acid Check procalcitonin, MRSA PCR, C. difficile screening, GI panel Continue with azithromycin 500 mg daily, ceftriaxone 2 g IV daily to complete 5-day course Status post sodium chloride 1 L bolus per EDP Ordered LR 1 L bolus on admission and LR 150 mL/h continuous for 16 hours Admit to PCU, inpatient

## 2022-03-15 NOTE — ED Notes (Signed)
Lab called to notify of specimen hemolyzation for second time. Results will be delayed. Asked Phlebotomist to stop by to draw new sample.

## 2022-03-16 ENCOUNTER — Inpatient Hospital Stay: Payer: PPO

## 2022-03-16 ENCOUNTER — Encounter: Payer: Self-pay | Admitting: Internal Medicine

## 2022-03-16 DIAGNOSIS — J189 Pneumonia, unspecified organism: Secondary | ICD-10-CM

## 2022-03-16 DIAGNOSIS — R918 Other nonspecific abnormal finding of lung field: Secondary | ICD-10-CM

## 2022-03-16 DIAGNOSIS — J9601 Acute respiratory failure with hypoxia: Secondary | ICD-10-CM

## 2022-03-16 DIAGNOSIS — A419 Sepsis, unspecified organism: Secondary | ICD-10-CM | POA: Diagnosis not present

## 2022-03-16 LAB — CBC
HCT: 38.1 % (ref 36.0–46.0)
Hemoglobin: 12.4 g/dL (ref 12.0–15.0)
MCH: 29.9 pg (ref 26.0–34.0)
MCHC: 32.5 g/dL (ref 30.0–36.0)
MCV: 91.8 fL (ref 80.0–100.0)
Platelets: 165 10*3/uL (ref 150–400)
RBC: 4.15 MIL/uL (ref 3.87–5.11)
RDW: 14.5 % (ref 11.5–15.5)
WBC: 21.5 10*3/uL — ABNORMAL HIGH (ref 4.0–10.5)
nRBC: 0 % (ref 0.0–0.2)

## 2022-03-16 LAB — PROTIME-INR
INR: 1.2 (ref 0.8–1.2)
Prothrombin Time: 15.1 seconds (ref 11.4–15.2)

## 2022-03-16 LAB — BASIC METABOLIC PANEL
Anion gap: 8 (ref 5–15)
BUN: 41 mg/dL — ABNORMAL HIGH (ref 8–23)
CO2: 21 mmol/L — ABNORMAL LOW (ref 22–32)
Calcium: 7 mg/dL — ABNORMAL LOW (ref 8.9–10.3)
Chloride: 94 mmol/L — ABNORMAL LOW (ref 98–111)
Creatinine, Ser: 1.25 mg/dL — ABNORMAL HIGH (ref 0.44–1.00)
GFR, Estimated: 41 mL/min — ABNORMAL LOW (ref >60)
Glucose, Bld: 132 mg/dL — ABNORMAL HIGH (ref 70–99)
Potassium: 4.3 mmol/L (ref 3.5–5.1)
Sodium: 123 mmol/L — ABNORMAL LOW (ref 135–145)

## 2022-03-16 LAB — SODIUM: Sodium: 125 mmol/L — ABNORMAL LOW (ref 135–145)

## 2022-03-16 LAB — C-REACTIVE PROTEIN: CRP: 19.2 mg/dL — ABNORMAL HIGH (ref <1.0)

## 2022-03-16 MED ORDER — SODIUM CHLORIDE 0.9 % IV SOLN
1.0000 g | INTRAVENOUS | Status: DC
  Administered 2022-03-16: 1 g via INTRAVENOUS
  Filled 2022-03-16 (×2): qty 10

## 2022-03-16 MED ORDER — METHYLPREDNISOLONE SODIUM SUCC 40 MG IJ SOLR
40.0000 mg | INTRAMUSCULAR | Status: DC
  Administered 2022-03-17: 40 mg via INTRAVENOUS
  Filled 2022-03-16: qty 1

## 2022-03-16 MED ORDER — VITAMIN D 25 MCG (1000 UNIT) PO TABS
1000.0000 [IU] | ORAL_TABLET | Freq: Every day | ORAL | Status: DC
  Administered 2022-03-16: 1000 [IU] via ORAL
  Filled 2022-03-16: qty 1

## 2022-03-16 MED ORDER — DM-GUAIFENESIN ER 30-600 MG PO TB12
1.0000 | ORAL_TABLET | Freq: Two times a day (BID) | ORAL | Status: DC
  Administered 2022-03-16: 1 via ORAL
  Filled 2022-03-16: qty 1

## 2022-03-16 MED ORDER — DM-GUAIFENESIN ER 30-600 MG PO TB12
2.0000 | ORAL_TABLET | Freq: Two times a day (BID) | ORAL | Status: DC
  Administered 2022-03-16 – 2022-03-17 (×2): 2 via ORAL
  Filled 2022-03-16 (×2): qty 2

## 2022-03-16 MED ORDER — ENSURE ENLIVE PO LIQD
237.0000 mL | Freq: Two times a day (BID) | ORAL | Status: DC
  Administered 2022-03-16: 237 mL via ORAL

## 2022-03-16 MED ORDER — METOPROLOL SUCCINATE ER 100 MG PO TB24
100.0000 mg | ORAL_TABLET | Freq: Every day | ORAL | Status: DC
  Administered 2022-03-16 – 2022-03-17 (×2): 100 mg via ORAL
  Filled 2022-03-16 (×2): qty 1

## 2022-03-16 MED ORDER — MAGNESIUM OXIDE -MG SUPPLEMENT 400 (240 MG) MG PO TABS
400.0000 mg | ORAL_TABLET | Freq: Every day | ORAL | Status: DC
  Administered 2022-03-16 – 2022-03-17 (×2): 400 mg via ORAL
  Filled 2022-03-16 (×2): qty 1

## 2022-03-16 MED ORDER — ADULT MULTIVITAMIN W/MINERALS CH
1.0000 | ORAL_TABLET | Freq: Every day | ORAL | Status: DC
  Filled 2022-03-16 (×2): qty 1

## 2022-03-16 MED ORDER — IOHEXOL 300 MG/ML  SOLN
50.0000 mL | Freq: Once | INTRAMUSCULAR | Status: AC | PRN
  Administered 2022-03-16: 50 mL via INTRAVENOUS

## 2022-03-16 MED ORDER — BENZONATATE 100 MG PO CAPS
100.0000 mg | ORAL_CAPSULE | Freq: Three times a day (TID) | ORAL | Status: DC
  Administered 2022-03-16 – 2022-03-17 (×3): 100 mg via ORAL
  Filled 2022-03-16 (×3): qty 1

## 2022-03-16 MED ORDER — OMEGA-3-ACID ETHYL ESTERS 1 G PO CAPS
1.0000 g | ORAL_CAPSULE | Freq: Every day | ORAL | Status: DC
  Administered 2022-03-16: 1 g via ORAL
  Filled 2022-03-16 (×2): qty 1

## 2022-03-16 MED ORDER — FERROUS SULFATE 325 (65 FE) MG PO TABS
325.0000 mg | ORAL_TABLET | Freq: Two times a day (BID) | ORAL | Status: DC
  Administered 2022-03-16 (×2): 325 mg via ORAL
  Filled 2022-03-16 (×3): qty 1

## 2022-03-16 NOTE — Plan of Care (Signed)

## 2022-03-16 NOTE — Consult Note (Signed)
PULMONOLOGY         Date: 03/16/2022,   MRN# [id number] Easter Simpson Blayney 01-22-1928     AdmissionWeight: 54.4 kg                 CurrentWeight: 54.4 kg  Referring provider: Dr Posey Pronto   CHIEF COMPLAINT:   Multifocal infiltrates with nodular lesions   HISTORY OF PRESENT ILLNESS   This is a 89yo F with hx of Achalasia, Barrets esophagus, DM, GERD, CKD, who came in with NVD and hypoxemia.  She was found to have acute COVID19 infection.  She had abnormal CT ABD and Pelvis with lung windows showing Interval development of multiple nodular lesions in the lung bases, most prominently in the left lower lobe with tree-in-bud configuration in the anterior left base. Small left and tiny right pleural effusions evident. Hiatal hernia noted. During interview the patient was unable to speak much due to congested cough. She is DNR. PCCM consultation placed for additional evaluation and management of nodular lesions of bilateral lungs.      PAST MEDICAL HISTORY   Past Medical History:  Diagnosis Date   Achalasia    Barrett's esophagus with dysplasia    Diabetes mellitus without complication (HCC)    GERD (gastroesophageal reflux disease)    Hyperlipidemia    Hypertension    Kidney cancer, primary, with metastasis from kidney to other site [facility])    Son   MRSA infection    lung   Renal insufficiency    Thyroid disease      SURGICAL HISTORY   Past Surgical History:  Procedure Laterality Date   ABDOMINAL HYSTERECTOMY     complete   achalasia     APPENDECTOMY     HIATAL HERNIA REPAIR     KNEE ARTHROPLASTY Right 04/01/2016   Procedure: COMPUTER ASSISTED TOTAL KNEE ARTHROPLASTY;  Surgeon: Dereck Leep, MD;  Location: [facility] ORS;  Service: Orthopedics;  Laterality: Right;   KNEE ARTHROPLASTY Left 11/30/2016   Procedure: COMPUTER ASSISTED TOTAL KNEE ARTHROPLASTY;  Surgeon: Dereck Leep, MD;  Location: [facility] ORS;  Service: Orthopedics;  Laterality: Left;   PARATHYROIDECTOMY      THYROID EXPLORATION     TONSILLECTOMY       FAMILY HISTORY   Family History  Problem Relation Age of Onset   Heart disease Mother    Diabetes Father    Lung cancer Sister    Diabetes Sister    Stomach cancer Brother    CAD Brother    Kidney cancer Son      SOCIAL HISTORY   Social History   Tobacco Use   Smoking status: Former    Packs/day: 0.25    Years: 1.00    Total pack years: 0.25    Types: Cigarettes    Quit date: 10/13/1962    Years since quitting: 59.4   Smokeless tobacco: Never   Tobacco comments:    smoked one pack a week for 1 year - 71 years ago  Vaping Use   Vaping Use: Never used  Substance Use Topics   Alcohol use: No   Drug use: No     MEDICATIONS    Home Medication:    Current Medication:  Current Facility-Administered Medications:    acetaminophen (TYLENOL) tablet 650 mg, 650 mg, Oral, Q6H PRN **OR** acetaminophen (TYLENOL) suppository 650 mg, 650 mg, Rectal, Q6H PRN, Cox, Amy N, DO   azithromycin (ZITHROMAX) 500 mg in sodium chloride 0.9 % 250 mL IVPB,  500 mg, Intravenous, Q24H, Cox, Amy N, DO, Last Rate: 250 mL/hr at 03/16/22 1040, 500 mg at 03/16/22 1040   benzonatate (TESSALON) capsule 100 mg, 100 mg, Oral, TID, Fritzi Mandes, MD   cefTRIAXone (ROCEPHIN) 2 g in sodium chloride 0.9 % 100 mL IVPB, 2 g, Intravenous, Q24H, Cox, Amy N, DO, Last Rate: 200 mL/hr at 03/16/22 1032, 2 g at 03/16/22 1032   cholecalciferol (VITAMIN D3) 25 MCG (1000 UNIT) tablet 1,000 Units, 1,000 Units, Oral, QAC supper, Fritzi Mandes, MD   dextromethorphan-guaiFENesin [facility] DM) 30-600 MG per 12 hr tablet 1 tablet, 1 tablet, Oral, BID, Fritzi Mandes, MD, 1 tablet at 03/16/22 1310   enoxaparin (LOVENOX) injection 30 mg, 30 mg, Subcutaneous, Q24H, Cox, Amy N, DO, 30 mg at 03/15/22 2208   feeding supplement (ENSURE ENLIVE / ENSURE PLUS) liquid 237 mL, 237 mL, Oral, BID BM, Fritzi Mandes, MD, 237 mL at 03/16/22 1040   feeding supplement (ENSURE ENLIVE / ENSURE PLUS) liquid  237 mL, 237 mL, Oral, BID BM, Fritzi Mandes, MD, 237 mL at 03/16/22 1310   ferrous sulfate tablet 325 mg, 325 mg, Oral, BID, Fritzi Mandes, MD, 325 mg at 03/16/22 1308   hydrALAZINE (APRESOLINE) injection 5 mg, 5 mg, Intravenous, Q8H PRN, Cox, Amy N, DO   hydrocortisone (ANUSOL-HC) 2.5 % rectal cream 1 Application, 1 Application, Rectal, BID PRN, Cox, Amy N, DO   ipratropium (ATROVENT) 0.03 % nasal spray 1 spray, 1 spray, Each Nare, BID, Cox, Amy N, DO, 1 spray at 03/16/22 1040   magnesium oxide (MAG-OX) tablet 400 mg, 400 mg, Oral, Daily, Fritzi Mandes, MD, 400 mg at 03/16/22 1308   melatonin tablet 5 mg, 5 mg, Oral, QHS PRN, Cox, Amy N, DO   metoprolol succinate (TOPROL-XL) 24 hr tablet 100 mg, 100 mg, Oral, Daily, Fritzi Mandes, MD, 100 mg at 03/16/22 1308   multivitamin with minerals tablet 1 tablet, 1 tablet, Oral, Daily, Fritzi Mandes, MD   omega-3 acid ethyl esters (LOVAZA) capsule 1 g, 1 g, Oral, Daily, Fritzi Mandes, MD, 1 g at 03/16/22 1308   ondansetron (ZOFRAN) tablet 4 mg, 4 mg, Oral, Q6H PRN **OR** ondansetron (ZOFRAN) injection 4 mg, 4 mg, Intravenous, Q6H PRN, Cox, Amy N, DO   senna-docusate (Senokot-S) tablet 1 tablet, 1 tablet, Oral, QHS PRN, Cox, Amy N, DO    ALLERGIES   Procaine     REVIEW OF SYSTEMS    Review of Systems:  Gen:  Denies  fever, sweats, chills weigh loss  HEENT: Denies blurred vision, double vision, ear pain, eye pain, hearing loss, nose bleeds, sore throat Cardiac:  No dizziness, chest pain or heaviness, chest tightness,edema Resp:   reports dyspnea chronically  Gi: Denies swallowing difficulty, stomach pain, nausea or vomiting, diarrhea, constipation, bowel incontinence Gu:  Denies bladder incontinence, burning urine Ext:   Denies Joint pain, stiffness or swelling Skin: Denies  skin rash, easy bruising or bleeding or hives Endoc:  Denies polyuria, polydipsia , polyphagia or weight change Psych:   Denies depression, insomnia or hallucinations   Other:   All other systems negative   VS: BP 125/61 (BP Location: Left Arm)   Pulse 99   Temp (!) 97.3 F (36.3 C)   Resp 18   Ht '5\' 4"'$  (1.626 m)   Wt 54.4 kg   SpO2 93%   BMI 20.60 kg/m      PHYSICAL EXAM    GENERAL:NAD, no fevers, chills, no weakness no fatigue HEAD: Normocephalic, atraumatic.  EYES: Pupils  equal, round, reactive to light. Extraocular muscles intact. No scleral icterus.  MOUTH: Moist mucosal membrane. Dentition intact. No abscess noted.  EAR, NOSE, THROAT: Clear without exudates. No external lesions.  NECK: Supple. No thyromegaly. No nodules. No JVD.  PULMONARY: decreased breath sounds with mild rhonchi worse at bases bilaterally.  CARDIOVASCULAR: S1 and S2. Regular rate and rhythm. No murmurs, rubs, or gallops. No edema. Pedal pulses 2+ bilaterally.  GASTROINTESTINAL: Soft, nontender, nondistended. No masses. Positive bowel sounds. No hepatosplenomegaly.  MUSCULOSKELETAL: No swelling, clubbing, or edema. Range of motion full in all extremities.  NEUROLOGIC: Cranial nerves II through XII are intact. No gross focal neurological deficits. Sensation intact. Reflexes intact.  SKIN: No ulceration, lesions, rashes, or cyanosis. Skin warm and dry. Turgor intact.  PSYCHIATRIC: Mood, affect within normal limits. The patient is awake, alert and oriented x 3. Insight, judgment intact.       IMAGING     ASSESSMENT/PLAN    Bilateral non-cystic fibrosis bronchiectasis with acute exacerbation       - Appears to be at least 89 years old with overlying acute exacerbation        - There is a hx of hiatal hernia with achalasia and query regarding chronic microaspiration with resultant bronchiectatic lungs, additional etiologies on differential include atypical mycobacterial infection as well as hypogammaglobulinemia.         -will upgrade Rocephin to Cefepime due to increased efficacy and continue with zithromax      - Mucinex to help patient thin out phlegm and expectorate  inspissated mucus   COVID19 infection     - ferritin and CRP trend    - as per [facility]            Thank you for allowing me to participate in the care of this patient.   Patient/Family are satisfied with care plan and all questions have been answered.    Provider disclosure: Patient with at least one acute or chronic illness or injury that poses a threat to life or bodily function and is being managed actively during this encounter.  All of the below services have been performed independently by signing provider:  review of prior documentation from internal and or external health records.  Review of previous and current lab results.  Interview and comprehensive assessment during patient visit today. Review of current and previous chest radiographs/CT scans. Discussion of management and test interpretation with health care team and patient/family.   This document was prepared using Dragon voice recognition software and may include unintentional dictation errors.     Ottie Glazier, M.D.  Division of Pulmonary & Critical Care Medicine

## 2022-03-16 NOTE — Progress Notes (Signed)
[place] [facility] [place] NAME: Deanna Bird    MR#:  [id number]  DATE OF BIRTH:  Mar 25, 1928  SUBJECTIVE:  Dter at bedside patient came in with increasing confusion at home for PO intake. Per daughter this patient started having a lot of cough. She is coughing up phlegm. No fever. More oriented alert per daughter. Found to have pneumonia multifocal. Positive for COVID.    VITALS:  Blood pressure (!) 146/65, pulse (!) 106, temperature 97.6 F (36.4 C), resp. rate 20, height '5\' 4"'$  (1.626 m), weight 54.4 kg, SpO2 93 %.  PHYSICAL EXAMINATION:   GENERAL:  89 y.o.-year-old patient with no acute distress.  LUNGS: decreased breath sounds bilaterally, no wheezing CARDIOVASCULAR: S1, S2 normal. No murmur   ABDOMEN: Soft, nontender, nondistended. Bowel sounds present.  EXTREMITIES: No  edema b/l.    NEUROLOGIC: nonfocal  patient is alert and awake  LABORATORY PANEL:  CBC Recent Labs  Lab 03/16/22 0315  WBC 21.5*  HGB 12.4  HCT 38.1  PLT 165    Chemistries  Recent Labs  Lab 03/15/22 1022 03/15/22 1308 03/16/22 0315  NA 123*   < > 123*  K 3.9  --  4.3  CL 89*  --  94*  CO2 23  --  21*  GLUCOSE 101*  --  132*  BUN 43*  --  41*  CREATININE 1.24*  --  1.25*  CALCIUM 7.5*  --  7.0*  MG 1.8  --   --   AST 41  --   --   ALT 18  --   --   ALKPHOS 41  --   --   BILITOT 1.0  --   --    < > = values in this interval not displayed.   Cardiac Enzymes No results for input(s): "TROPONINI" in the last 168 hours. RADIOLOGY:  CT ABDOMEN PELVIS WO CONTRAST  Result Date: 03/15/2022 CLINICAL DATA:  Generalized abdominal pain and 3 day history of diarrhea. EXAM: CT ABDOMEN AND PELVIS WITHOUT CONTRAST TECHNIQUE: Multidetector CT imaging of the abdomen and pelvis was performed following the standard protocol without IV contrast. RADIATION DOSE REDUCTION: This exam was performed according to the departmental dose-optimization program which includes  automated exposure control, adjustment of the mA and/or kV according to patient size and/or use of iterative reconstruction technique. COMPARISON:  Chest CT 06/16/2018 FINDINGS: Lower chest: Interval development of multiple nodular lesions in the lung bases, most prominently in the left lower lobe with tree-in-bud configuration in the anterior left base. Small left and tiny right pleural effusions evident. Hiatal hernia noted. Hepatobiliary: No suspicious focal abnormality in the liver on this study without intravenous contrast. There is no evidence for gallstones, gallbladder wall thickening, or pericholecystic fluid. No intrahepatic or extrahepatic biliary dilation. Pancreas: No focal mass lesion. No dilatation of the main duct. No intraparenchymal cyst. No peripancreatic edema. Spleen: No splenomegaly. No focal mass lesion. Adrenals/Urinary Tract: No adrenal nodule or mass. No stones are seen in the right kidney. 5 mm nonobstructing stone noted upper pole left kidney. 1.8 x 1.2 x 1.5 cm staghorn calculus noted lower pole left kidney. No left hydronephrosis. No evidence for hydroureter. The urinary bladder appears normal for the degree of distention. Stomach/Bowel: Moderate hiatal hernia. Duodenum is normally positioned as is the ligament of Treitz. No small bowel wall thickening. No small bowel dilatation. The terminal ileum is normal. The appendix is not well visualized, but there is no edema  or inflammation in the region of the cecum. No gross colonic mass. No colonic wall thickening. Vascular/Lymphatic: There is moderate atherosclerotic calcification of the abdominal aorta without aneurysm. There is no gastrohepatic or hepatoduodenal ligament lymphadenopathy. No retroperitoneal or mesenteric lymphadenopathy. No pelvic sidewall lymphadenopathy. Reproductive: Hysterectomy.  There is no adnexal mass. Other: Trace free fluid noted in the pelvis Musculoskeletal: No worrisome lytic or sclerotic osseous abnormality.  Diffuse body wall edema evident. IMPRESSION: 1. Interval development of multiple nodular lesions in the lung bases, most prominently in the left lower lobe with tree-in-bud configuration in the anterior left base. Imaging features are most suggestive of an infectious/inflammatory etiology. Dedicated CT chest without contrast recommended for definitive characterization and to establish follow-up recommendations. 2. Small left and tiny right pleural effusions. 3. Moderate hiatal hernia. 4. Nonobstructing left renal stones. 5. Diffuse body wall edema with trace free fluid in the pelvis. 6.  Aortic Atherosclerosis (ICD10-I70.0). Electronically Signed   By: Deanna Bird M.D.   On: 03/15/2022 11:47   DG Chest Port 1 View  Result Date: 03/15/2022 CLINICAL DATA:  Sepsis EXAM: PORTABLE CHEST 1 VIEW COMPARISON:  11/03/2017 FINDINGS: Normal heart size. Aortic atherosclerosis. Extensive patchy airspace opacities bilaterally, worse within the right lung. New focal density at the medial right lung apex measuring approximately 3.7 x 3.1 cm. No pneumothorax. IMPRESSION: 1. Extensive patchy airspace opacities bilaterally, worse within the right lung, most suspicious for multifocal pneumonia. 2. New focal density at the medial right lung apex measuring approximately 3.7 x 3.1 cm. Differential includes loculated pleural fluid or mass lesion. Follow-up CT of the chest, preferably with contrast, is suggested to further evaluate. Electronically Signed   By: Deanna Bird D.O.   On: 03/15/2022 10:49    Assessment and Plan Deanna Bird 89 year old female with history of hypertension, who presents emergency department for chief concerns of generalized weakness and diarrhea.   Severe sepsis (HCC) Multifocal pneumonia Right apical lung mass/nodule COVID infection --Patient meets criteria for severe sepsis with elevated lactic acid, markedly elevated leukocytosis, increased respiration rate, organ involvement is  pulmonary --Blood cultures negative so far --Continue with azithromycin 500 mg daily, ceftriaxone 2 g IV daily --received IVF --Pulmonary consult with Dr Lanney Gins --CT chest with contrast for ?right lung mass   Hyponatremia --due to poor po intake --pt placed on regular diet --mentation improving --on HCTZ at home--d/c for now  Acute respiratory failure with hypoxia ([place]) --spO2 was 88% on RA, presumed secondary to multi focal pneumonia and COVID-19 infection --Continue oxygen supplementation to maintain SpO2 greater than 92%   Diarrhea Query gastroenteritis secondary to COVID-19, symptomatic support Check C. difficile, GI panel pending--no diarrhea so far    Hypertension --cont Metoprolol , prn IV hydralazine  HL --on omega FA  PT/OT to see pt  Procedures:none Family communication :dter at bedside Consults :pulmoanry CODE STATUS: DNR--confirmed with dter DVT Prophylaxis :Enoxaparin Level of care: Progressive Status is: Inpatient Remains inpatient appropriate because: respiratory failure with hypoxia, focal pneumonia, COVID    TOTAL TIME TAKING CARE OF THIS PATIENT: 35 minutes.  >50% time spent on counselling and coordination of care  Note: This dictation was prepared with Dragon dictation along with smaller phrase technology. Any transcriptional errors that result from this process are unintentional.  Fritzi Mandes M.D    [facility]   CC: Primary care physician; Baxter Hire, MD

## 2022-03-16 NOTE — Progress Notes (Signed)
Initial Nutrition Assessment  DOCUMENTATION CODES:   Not applicable  INTERVENTION:   -MVI with minerals daily -Ensure Enlive po BID, each supplement provides 350 kcal and 20 grams of protein -Liberalize diet to regular for widest variety of meal selections  NUTRITION DIAGNOSIS:   Inadequate oral intake related to decreased appetite as evidenced by meal completion < 50%, per patient/family report.  GOAL:   Patient will meet greater than or equal to 90% of their needs  MONITOR:   PO intake, Supplement acceptance  REASON FOR ASSESSMENT:   Consult Assessment of nutrition requirement/status  ASSESSMENT:   Pt with history of hypertension, who presents for chief concerns of generalized weakness and diarrhea.  Pt admitted with severe sepsis and hyponatremia.   Reviewed I/O's: +1.4 L x 24 hours    Per H&P, pt endorses weakness since 03/06/22. Additionally, pt has also having diarrhea, however, this resolved on 03/14/22 (family attributes diarrhea to taking laxative and senna daily). She has also been having hallucinations for 2 days PTA.   Spoke with pt at bedside. Pt is hard of hearing and RD often had to repeat questions several times in order to obtain history. No family at bedside. Pt reports feeling better today. Per pt, she has been feeling poorly over the past 2 weeks secondary to shortness of breath, weakness, and diarrhea. Pt shares she typically consumes 2 meals per day (Breakfast: avocado and fresh fruit, Dinner: meat, starch, and vegetable). Pt explains that her daughter recently moved in with her to help care for her and she now prepares all of her meals. Pt also shares more frequent falls and hallucinations ("I'm seeing things that are not there"). Pt denies any current hallucinations.   Observed breakfast tray- pt consumed 1.5 cartons of juice, 50% of eggs, 50% of blueberry muffin, and 100% of yogurt. Sje feels more comfortable eating than drinking at this time. She also  started drinking protein shakes at home, sharing her daughter just bough her a case for home use.   Pt denies any weight loss, but unsure of UBW. Per review of [place] records, pt wt was 112# on 07/16/21. Wt has been stable over the past 8 months.   Discussed importance of good meal and supplement intake to promote healing. Pt amenable to supplements.   Medications reviewed and include 0.9% sodium chloride infusion @ 75 ml/hr.   Lab Results  Component Value Date   HGBA1C 5.5 08/10/2017   PTA DM medications are none.   Labs reviewed: Na: 123, CBGS: 90 (inpatient orders for glycemic control are none).    NUTRITION - FOCUSED PHYSICAL EXAM:  Flowsheet Row Most Recent Value  Orbital Region No depletion  Upper Arm Region Moderate depletion  Thoracic and Lumbar Region No depletion  Buccal Region No depletion  Temple Region No depletion  Clavicle Bone Region Moderate depletion  Clavicle and Acromion Bone Region Moderate depletion  Scapular Bone Region Moderate depletion  Dorsal Hand Mild depletion  Patellar Region Mild depletion  Anterior Thigh Region Mild depletion  Posterior Calf Region Mild depletion  Edema (RD Assessment) Mild  Hair Reviewed  Eyes Reviewed  Mouth Reviewed  Skin Reviewed  Nails Reviewed       Diet Order:   Diet Order             Diet regular Room service appropriate? Yes; Fluid consistency: Thin  Diet effective now                   EDUCATION  NEEDS:   Education needs have been addressed  Skin:  Skin Assessment: Reviewed RN Assessment  Last BM:  03/15/22  Height:   Ht Readings from Last 1 Encounters:  03/15/22 '5\' 4"'$  (1.626 m)    Weight:   Wt Readings from Last 1 Encounters:  03/15/22 54.4 kg    Ideal Body Weight:  54.5 kg  BMI:  Body mass index is 20.6 kg/m.  Estimated Nutritional Needs:   Kcal:  1450-1650  Protein:  65-80 grams  Fluid:  > 1.4 L    Loistine Chance, RD, LDN, [place] Registered Dietitian II Certified  Diabetes Care and Education Specialist Please refer to [facility] for RD and/or RD on-call/weekend/after hours pager

## 2022-03-17 DIAGNOSIS — A419 Sepsis, unspecified organism: Secondary | ICD-10-CM | POA: Diagnosis not present

## 2022-03-17 DIAGNOSIS — J9601 Acute respiratory failure with hypoxia: Secondary | ICD-10-CM | POA: Diagnosis not present

## 2022-03-17 DIAGNOSIS — R918 Other nonspecific abnormal finding of lung field: Secondary | ICD-10-CM | POA: Diagnosis not present

## 2022-03-17 DIAGNOSIS — J189 Pneumonia, unspecified organism: Secondary | ICD-10-CM | POA: Diagnosis not present

## 2022-03-17 MED ORDER — GLYCOPYRROLATE 1 MG PO TABS
1.0000 mg | ORAL_TABLET | ORAL | Status: DC | PRN
  Filled 2022-03-17: qty 1

## 2022-03-17 MED ORDER — ORAL CARE MOUTH RINSE: 15.0000 mL | OROMUCOSAL | Status: DC | PRN

## 2022-03-17 MED ORDER — MIDAZOLAM HCL 2 MG/2ML IJ SOLN: 2.0000 mg | INTRAMUSCULAR | Status: DC | PRN

## 2022-03-17 MED ORDER — GLYCOPYRROLATE 0.2 MG/ML IJ SOLN: 0.2000 mg | INTRAMUSCULAR | Status: DC | PRN

## 2022-03-17 MED ORDER — SODIUM CHLORIDE 0.9 % IV SOLN: INTRAVENOUS | Status: DC

## 2022-03-17 MED ORDER — ACETAMINOPHEN 650 MG RE SUPP: 650.0000 mg | Freq: Four times a day (QID) | RECTAL | Status: DC | PRN

## 2022-03-17 MED ORDER — ORAL CARE MOUTH RINSE: 15.0000 mL | OROMUCOSAL | Status: DC

## 2022-03-17 MED ORDER — POLYVINYL ALCOHOL 1.4 % OP SOLN: 1.0000 [drp] | Freq: Four times a day (QID) | OPHTHALMIC | Status: DC | PRN

## 2022-03-17 MED ORDER — MORPHINE SULFATE (PF) 2 MG/ML IV SOLN
2.0000 mg | INTRAVENOUS | Status: DC | PRN
  Administered 2022-03-17: 2 mg via INTRAVENOUS
  Filled 2022-03-17: qty 1

## 2022-03-17 MED ORDER — GLYCOPYRROLATE 0.2 MG/ML IJ SOLN
0.2000 mg | INTRAMUSCULAR | Status: DC | PRN
  Filled 2022-03-17: qty 1

## 2022-03-17 MED ORDER — ACETAMINOPHEN 325 MG PO TABS: 650.0000 mg | ORAL_TABLET | Freq: Four times a day (QID) | ORAL | Status: DC | PRN

## 2022-03-17 NOTE — Progress Notes (Signed)
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 Patel for hospice services at home after discharge via Epic chat (TOC/[place] aware & also notified via epic chat). Chart and patient information under review by [facility] physician.   Spoke with daughter/Dawn to initiate education related to hospice philosophy, services, and team approach to care. Dawn verbalized understanding of information given. Per discussion, the plan is for patient to discharge home via ems once cleared to DC.    DME needs discussed. Patient has the following equipment in the home: N/a Patient requests the following equipment for delivery: N.a  Address verified and is correct in the chart.   Please send signed and completed DNR home with patient/family. Please provide prescriptions at discharge as needed to ensure ongoing symptom management.    [facility] information and contact numbers given to family & above information shared with TOC.   Please call with any questions/concerns.    Thank you for the opportunity to participate in this patient's care.   Phillis Haggis, MSW [place] [facility] Liaison  [phone]

## 2022-03-17 NOTE — Progress Notes (Signed)
[facility] NAME: Deanna Bird    MR#:  [id number]  DATE OF BIRTH:  [birth year]  SUBJECTIVE:  Dter at bedside patient came in with increasing confusion at home for PO intake. Per daughter this patient started having a lot of cough. Found to have pneumonia multifocal. Positive for COVID.  Patient today is very lethargic. She has been mentioning" want to see Jesus" and want to die to staff members. During my evaluation she was not able to have a meaningful conversation. Has not been eating her daughter.    VITALS:  Blood pressure (!) 120/58, pulse 66, temperature (!) 97.5 F (36.4 C), temperature source Axillary, resp. rate 19, height '5\' 4"'$  (1.626 m), weight 54.4 kg, SpO2 91 %.  PHYSICAL EXAMINATION:   GENERAL:  89 y.o.-year-old patient with moderate acute distress.  LUNGS: decreased breath sounds bilaterally, no wheezing CARDIOVASCULAR: S1, S2 normal. No murmur   ABDOMEN: Soft, nontender, nondistended. Bowel sounds present.  EXTREMITIES: No  edema b/l.    NEUROLOGIC: nonfocal  patient is lethargic unable to have any meaningful conversation.  LABORATORY PANEL:  CBC Recent Labs  Lab 03/16/22 0315  WBC 21.5*  HGB 12.4  HCT 38.1  PLT 165     Chemistries  Recent Labs  Lab 03/15/22 1022 03/15/22 1308 03/16/22 0315  NA 123*   < > 123*  K 3.9  --  4.3  CL 89*  --  94*  CO2 23  --  21*  GLUCOSE 101*  --  132*  BUN 43*  --  41*  CREATININE 1.24*  --  1.25*  CALCIUM 7.5*  --  7.0*  MG 1.8  --   --   AST 41  --   --   ALT 18  --   --   ALKPHOS 41  --   --   BILITOT 1.0  --   --    < > = values in this interval not displayed.    Cardiac Enzymes No results for input(s): "TROPONINI" in the last 168 hours. RADIOLOGY:  CT CHEST W CONTRAST  Result Date: 03/16/2022 CLINICAL DATA:  Abnormal x-ray with lung opacities. Loss of cough. COVID positive EXAM: CT CHEST WITH CONTRAST TECHNIQUE: Multidetector CT imaging of the  chest was performed during intravenous contrast administration. RADIATION DOSE REDUCTION: This exam was performed according to the departmental dose-optimization program which includes automated exposure control, adjustment of the mA and/or kV according to patient size and/or use of iterative reconstruction technique. CONTRAST:  77m OMNIPAQUE IOHEXOL 300 MG/ML  SOLN COMPARISON:  Radiographs 03/15/2022 and CT chest 06/16/2018 FINDINGS: Cardiovascular: Borderline cardiomegaly. Coronary artery and aortic atherosclerotic calcification. Trace pericardial effusion. Mediastinum/Nodes: Patulous esophagus containing fluid with air-fluid level near the thoracic inlet. Small hiatal hernia. Multiple enlarged mediastinal and right hilar nodes for example 1.5 cm pretracheal node (2/51) and 1.5 cm right hilar node (2/67). Lungs/Pleura: Diffuse bronchial wall thickening. Diffuse bilateral centrilobular micro nodules and tree-in-bud opacities. Mild diffuse interlobular septal thickening. Small left-greater-than-right pleural effusions. Mucous plugging with partial atelectasis of the right middle lobe. More nodular consolidative opacities in the right upper lobe. Focal subpleural consolidation in the right upper lobe medially (4/31) measures 3.4 x 2.7 cm in corresponds to the apical density on recent radiograph. Upper Abdomen: No acute abnormality. Musculoskeletal: No chest wall abnormality. No acute or significant osseous findings. IMPRESSION: 1. Pulmonary parenchymal findings compatible with small airway infection/inflammation possibly due to aspiration given large fluid  column in the esophagus. 2. Focal subpleural consolidation in the right upper lobe medially measures 3.4 x 2.7 cm and corresponds to the apical density on recent radiograph. While this could represent a focal area of infection, underlying malignancy is not excluded. Short interval follow-up in 4-6 weeks is recommended after treatment. 3. Small  left-greater-than-right pleural effusions. 4. Patulous esophagus containing fluid with air-fluid level near the thoracic inlet. Correlate with signs/symptoms of gastroesophageal reflux. 5. Indeterminate mediastinal and right hilar adenopathy. Continued attention on follow-up. 6. Coronary artery calcification. Aortic Atherosclerosis (ICD10-I70.0). Electronically Signed   By: Placido Sou M.D.   On: 03/16/2022 22:00    Assessment and Plan Adelei Bird 89 year old female with history of hypertension, who presents emergency department for chief concerns of generalized weakness and diarrhea.   Severe sepsis (HCC) Multifocal pneumonia/aspiration H/o Achalasia  Right apical lung mass/nodule COVID infection Acute metaboli encephalopathy --Patient meets criteria for severe sepsis with elevated lactic acid, markedly elevated leukocytosis, increased respiration rate, organ involvement is pulmonary --Blood cultures negative so far --Continue with azithromycin and cefepime - IVF --Pulmonary consult with Dr Lanney Gins --CT chest with contrast: Pulmonary parenchymal findings compatible with small airway infection/inflammation possibly due to aspiration given large fluid column in the esophagus. 2. Focal subpleural consolidation in the right upper lobe medially measures 3.4 x 2.7 cm and corresponds to the apical density on recent radiograph. While this could represent a focal area of infection, underlying malignancy is not excluded.  -- Patient today very lethargic. Poor PO intake. Failure to thrive.   Hyponatremia --due to poor po intake --pt placed on regular diet-- not eating --on HCTZ at home--d/c for now -- trickle IV fluids  Acute respiratory failure with hypoxia (HCC) --spO2 was 88% on RA, presumed secondary to multi focal pneumonia and COVID-19 infection --Continue oxygen supplementation to maintain SpO2 greater than 92%   Diarrhea Query gastroenteritis secondary to COVID-19, symptomatic  support Check C. difficile, GI panel pending--no diarrhea so far    Hypertension --cont Metoprolol , prn IV hydralazine  HL --on omega FA  PT/OT to see pt--rehab  Spoke with patient's daughter [name] at bedside. Reviewed CT scan and showed her the scans. She understands severity of illness and looks like acute on chronic aspiration given history of achalasia and fluid levels noted on in the esophagus. Discussed option of hospice with daughter in the event condition declines. Daughter voice understanding and very appreciated the discussion.    Procedures:none Family communication :dter at bedside Consults :pulmoanry CODE STATUS: DNR--confirmed with dter DVT Prophylaxis :Enoxaparin Level of care: Progressive Status is: Inpatient Remains inpatient appropriate because: respiratory failure with hypoxia, focal pneumonia, COVID    TOTAL TIME TAKING CARE OF THIS PATIENT: 35 minutes.  >50% time spent on counselling and coordination of care  Note: This dictation was prepared with Dragon dictation along with smaller phrase technology. Any transcriptional errors that result from this process are unintentional.  Fritzi Mandes M.D    [facility]   CC: Primary care physician; Baxter Hire, MD

## 2022-03-17 NOTE — Evaluation (Signed)
Physical Therapy Evaluation Patient Details Name: Deanna Bird MRN: [id number] DOB: 30-Oct-1928 Today's Date: 03/17/2022  History of Present Illness  Deanna Bird is a 89 y.o. female.  Past medical history of diabetes, hypertension, hyperlipidemia, thyroid disease, GERD, who presents to the emergency department with generalized weakness and fatigue.    Clinical Impression  Pt received in Semi-Fowler's position with OT in room.  Pt currently demonstrating lethargy and inability to effectively communicate with therapists.  Pt does open her eyes at times, but is unable to stay awake.  Therapists assisted pt into sitting position with minimal assistance from pt.  Pt able to actively move her legs, but unable to maintain her balance or utilize her UE's for support.  Pt at times just requesting to be left alone and that she "wants to meet Jesus".  Pt vitals stayed WNL throughout the session, however pt still coughing up phlegm throughout.  Pt assisted back into bed with the bed placed in a chair position to assist her with coughing up the phlegm.  Pt advised to spit the phlegm on the pad that was placed in front of her if she was unable to utilize the bin that was given to her.  Pt will be placed on a trial basis for therapy, with potential of pt being placed on palliative care.  Current discharge plans to SNF most appropriate at this time.  Pt will continue to benefit from skilled therapy in order to address deficits listed below.         Recommendations for follow up therapy are one component of a multi-disciplinary discharge planning process, led by the attending physician.  Recommendations may be updated based on patient status, additional functional criteria and insurance authorization.  Follow Up Recommendations Skilled nursing-short term rehab (<3 hours/day) Can patient physically be transported by private vehicle: No    Assistance Recommended at Discharge Frequent or constant  Supervision/Assistance  Patient can return home with the following  A lot of help with walking and/or transfers;A lot of help with bathing/dressing/bathroom;Assistance with cooking/housework;Assistance with feeding;Direct supervision/assist for medications management;Direct supervision/assist for financial management;Assist for transportation;Help with stairs or ramp for entrance    Equipment Recommendations None recommended by PT  Recommendations for Other Services       Functional Status Assessment Patient has had a recent decline in their functional status and/or demonstrates limited ability to make significant improvements in function in a reasonable and predictable amount of time     Precautions / Restrictions        Mobility  Bed Mobility Overal bed mobility: Needs Assistance Bed Mobility: Supine to Sit     Supine to sit: +2 for safety/equipment, Max assist     General bed mobility comments: Pt able to minimally contribute, most notably only able to move her legs but requires stability with upright posture.    Transfers                   General transfer comment: deferred due to lethargy.    Ambulation/Gait                  Stairs            Wheelchair Mobility    Modified Rankin (Stroke Patients Only)       Balance Overall balance assessment: Needs assistance Sitting-balance support: Bilateral upper extremity supported Sitting balance-Leahy Scale: Poor  Pertinent Vitals/Pain Pain Assessment Pain Assessment:  (Unable to accurately assess.)    Home Living Family/patient expects to be discharged to:: Private residence (All prior living arrangements pulled from session 5 years ago.  Attempted to call family members to confirm, but they did not answer.) Living Arrangements: Spouse/significant other;Other relatives Available Help at Discharge: Family Type of Home: House Home Access:  Level entry;Ramped entrance     Alternate Level Stairs-Number of Steps: tri-level home 20 total Home Layout: Multi-level Home Equipment: [place] (2 wheels);Cane - single point;Shower seat;Toilet riser;Grab bars - tub/shower;Hand held shower head;Adaptive equipment Additional Comments: Pt has a ramp to enter her home. Pt lives in a trilevel home. 8 steps up to living room (there is a bedroom on this floor), another 8 steps up to her bedroom. L railing on both stair cases. There is a walk in shower on the main level. There is a tub shower unit on the top floor where her bedroom/bathroom is.    Prior Function                       Hand Dominance   Dominant Hand: Right    Extremity/Trunk Assessment   Upper Extremity Assessment Upper Extremity Assessment: Generalized weakness    Lower Extremity Assessment Lower Extremity Assessment: Generalized weakness       Communication   Communication:  (Difficult due to pt lethargy.)  Cognition Arousal/Alertness: Lethargic   Overall Cognitive Status: Difficult to assess                                          General Comments      Exercises     Assessment/Plan    PT Assessment Patient needs continued PT services (Will attempt on trial basis.)  PT Problem List Decreased strength;Decreased activity tolerance;Decreased balance;Decreased mobility       PT Treatment Interventions DME instruction;Gait training;Functional mobility training;Therapeutic activities;Therapeutic exercise;Balance training;Neuromuscular re-education;Patient/family education    PT Goals (Current goals can be found in the Care Plan section)  Acute Rehab PT Goals Patient Stated Goal: not able to assess PT Goal Formulation: With patient Time For Goal Achievement: 03/31/22 Potential to Achieve Goals: Poor    Frequency Min 2X/week     Co-evaluation PT/OT/SLP Co-Evaluation/Treatment: Yes Reason for Co-Treatment: Complexity of  the patient's impairments (multi-system involvement);Necessary to address cognition/behavior during functional activity;To address functional/ADL transfers PT goals addressed during session: Mobility/safety with mobility;Balance OT goals addressed during session: ADL's and self-care       AM-PAC PT "6 Clicks" Mobility  Outcome Measure Help needed turning from your back to your side while in a flat bed without using bedrails?: A Lot Help needed moving from lying on your back to sitting on the side of a flat bed without using bedrails?: A Lot Help needed moving to and from a bed to a chair (including a wheelchair)?: Total Help needed standing up from a chair using your arms (e.g., wheelchair or bedside chair)?: Total Help needed to walk in hospital room?: Total Help needed climbing 3-5 steps with a railing? : Total 6 Click Score: 8    End of Session   Activity Tolerance: Patient limited by lethargy Patient left: in bed;with call bell/phone within reach Nurse Communication: Mobility status PT Visit Diagnosis: Unsteadiness on feet (R26.81);Other abnormalities of gait and mobility (R26.89);Muscle weakness (generalized) (M62.81);Difficulty in walking, not elsewhere classified (  R26.2);Adult, failure to thrive (R62.7)    Time: 0946-1008 PT Time Calculation (min) (ACUTE ONLY): 22 min   Charges:   PT Evaluation $PT Eval Low Complexity: 1 Low          Gwenlyn Saran, PT, DPT Physical Therapist - [facility]  03/17/22, 10:33 AM

## 2022-03-17 NOTE — Evaluation (Signed)
Occupational Therapy Evaluation Patient Details Name: Deanna Bird MRN: [id number] DOB: 19-Jan-1928 Today's Date: 03/17/2022   History of Present Illness Deanna Bird is a 89 y.o. female.  Past medical history of diabetes, hypertension, hyperlipidemia, thyroid disease, GERD, who presents to the emergency department with generalized weakness and fatigue.   Clinical Impression   Patient presenting with decreased Ind in self care,balance, functional mobility/transfers, endurance, and safety awareness. Patient is very fatigued and requiring +2 assistance for mobility. Pt needing hand over hand assistance for self care tasks. Pt coughing up phlegm but unable to manage in mouth. Pt is very far from baseline.  Patient will benefit from acute OT to increase overall independence in the areas of ADLs, functional mobility, and safety awareness in order to safely discharge to next venue of care.      Recommendations for follow up therapy are one component of a multi-disciplinary discharge planning process, led by the attending physician.  Recommendations may be updated based on patient status, additional functional criteria and insurance authorization.   Follow Up Recommendations  Skilled nursing-short term rehab (<3 hours/day)     Assistance Recommended at Discharge Frequent or constant Supervision/Assistance  Patient can return home with the following A lot of help with walking and/or transfers;Two people to help with bathing/dressing/bathroom;Help with stairs or ramp for entrance;Assist for transportation;Assistance with cooking/housework    Functional Status Assessment  Patient has had a recent decline in their functional status and demonstrates the ability to make significant improvements in function in a reasonable and predictable amount of time.  Equipment Recommendations  Other (comment) (defer to next venue of care)    Recommendations for Other Services       Precautions / Restrictions  Precautions Precautions: Fall      Mobility Bed Mobility Overal bed mobility: Needs Assistance Bed Mobility: Supine to Sit     Supine to sit: +2 for safety/equipment, Max assist     General bed mobility comments: Pt able to minimally contribute, most notably only able to move her legs but requires stability with upright posture.    Transfers                   General transfer comment: deferred due to lethargy.      Balance Overall balance assessment: Needs assistance Sitting-balance support: Bilateral upper extremity supported Sitting balance-Leahy Scale: Poor                                     ADL either performed or assessed with clinical judgement   ADL Overall ADL's : Needs assistance/impaired                                       General ADL Comments: hand over hand total A secondary to lethargy during session     Vision         Perception     Praxis      Pertinent Vitals/Pain Pain Assessment Pain Assessment: Faces Faces Pain Scale: No hurt     Hand Dominance Right   Extremity/Trunk Assessment Upper Extremity Assessment Upper Extremity Assessment: Generalized weakness   Lower Extremity Assessment Lower Extremity Assessment: Generalized weakness       Communication Communication Communication: Other (comment) (difficult secondary to lethargy)   Cognition Arousal/Alertness: Lethargic   Overall Cognitive Status:  Difficult to assess                                       General Comments       Exercises     Shoulder Instructions      Home Living Family/patient expects to be discharged to:: Private residence (information obtained from previous hospital admission. Attempted to call family with no answer.) Living Arrangements: Spouse/significant other;Other relatives Available Help at Discharge: Family Type of Home: House Home Access: Level entry;Ramped entrance     Home Layout:  Multi-level Alternate Level Stairs-Number of Steps: tri-level home 20 total Alternate Level Stairs-Rails: Left Bathroom Shower/Tub: Occupational psychologist: Handicapped height Bathroom Accessibility: Yes   Home Equipment: Conservation officer, nature (2 wheels);Cane - single point;Shower seat;Toilet riser;Grab bars - tub/shower;Hand held shower head;Adaptive equipment Adaptive Equipment: [place] Additional Comments: Pt has a ramp to enter her home. Pt lives in a trilevel home. 8 steps up to living room (there is a bedroom on this floor), another 8 steps up to her bedroom. L railing on both stair cases. There is a walk in shower on the main level. There is a tub shower unit on the top floor where her bedroom/bathroom is.      Prior Functioning/Environment Prior Level of Function : Independent/Modified Independent                        OT Problem List: Decreased strength;Decreased activity tolerance;Impaired balance (sitting and/or standing);Decreased safety awareness;Decreased knowledge of use of DME or AE      OT Treatment/Interventions: Self-care/ADL training;Therapeutic exercise;Therapeutic activities;Energy conservation;DME and/or AE instruction;Patient/family education;Balance training    OT Goals(Current goals can be found in the care plan section) Acute Rehab OT Goals Patient Stated Goal: "to go to Jesus" OT Goal Formulation: With patient Time For Goal Achievement: 03/31/22 Potential to Achieve Goals: Good ADL Goals Pt Will Perform Grooming: with min guard assist;sitting Pt Will Perform Lower Body Dressing: with min assist;sit to/from stand Pt Will Transfer to Toilet: with min assist Pt Will Perform Toileting - Clothing Manipulation and hygiene: with min assist;sit to/from stand  OT Frequency: Min 2X/week    Co-evaluation PT/OT/SLP Co-Evaluation/Treatment: Yes Reason for Co-Treatment: Complexity of the patient's impairments (multi-system involvement);Necessary to  address cognition/behavior during functional activity;To address functional/ADL transfers PT goals addressed during session: Mobility/safety with mobility;Balance OT goals addressed during session: ADL's and self-care      AM-PAC OT "6 Clicks" Daily Activity     Outcome Measure Help from another person eating meals?: A Lot Help from another person taking care of personal grooming?: A Lot Help from another person toileting, which includes using toliet, bedpan, or urinal?: Total Help from another person bathing (including washing, rinsing, drying)?: Total Help from another person to put on and taking off regular upper body clothing?: A Lot Help from another person to put on and taking off regular lower body clothing?: Total 6 Click Score: 9   End of Session Nurse Communication: Mobility status  Activity Tolerance: Patient limited by lethargy Patient left: in bed;with call bell/phone within reach;with bed alarm set  OT Visit Diagnosis: Unsteadiness on feet (R26.81);Muscle weakness (generalized) (M62.81)                Time: 0940-1003 OT Time Calculation (min): 23 min Charges:  OT General Charges $OT Visit: 1 Visit OT Evaluation $OT Eval Moderate  Complexity: [place], MS, OTR/L , CBIS ascom [phone]  03/17/22, 1:27 PM

## 2022-03-17 NOTE — Progress Notes (Signed)
Family at bedside - aware of pt transfer to room 204.

## 2022-03-17 NOTE — Progress Notes (Addendum)
Patient overall has not shown any improvement and has severe lung infection suspected ongoing aspiration with history of esophageal  motility syndrome. Patient is very lethargic and unable to participate. Poor PO intake. As per my discussion with patient's daughter Deanna Bird she discussed with her stepfather and together  in agreement with comfort care and request hospice. They do not wish to have patient suffering pain. Will place comfort care orders. Hospice has been consulted. Dr Lanney Gins informed and agrees with plan

## 2022-03-17 NOTE — Progress Notes (Signed)
PULMONOLOGY         Date: 03/17/2022,   MRN# [id number] Deanna Bird April 01, 1928     AdmissionWeight: 54.4 kg                 CurrentWeight: 54.4 kg  Referring provider: Dr Posey Pronto   CHIEF COMPLAINT:   Multifocal infiltrates with nodular lesions   HISTORY OF PRESENT ILLNESS   This is a 89yo F with hx of Achalasia, Barrets esophagus, DM, GERD, CKD, who came in with NVD and hypoxemia.  She was found to have acute COVID19 infection.  She had abnormal CT ABD and Pelvis with lung windows showing Interval development of multiple nodular lesions in the lung bases, most prominently in the left lower lobe with tree-in-bud configuration in the anterior left base. Small left and tiny right pleural effusions evident. Hiatal hernia noted. During interview the patient was unable to speak much due to congested cough. She is DNR. PCCM consultation placed for additional evaluation and management of nodular lesions of bilateral lungs.    03/17/22- Seen and evaluated patient this morning.  She was poorly responsive and continues to have congested cough. RN was in room during my evaluation and was concerned regarding patients poor mental status and condition.  I also spoke with Dr Posey Pronto after this and she had reviewed patients declining condition with family.  It appears after thorough discourse regarding goals of care patient's family expressed wishes to proceed with full comfort measures. I support this decision as patient is with advanced age and is with poor prognosis short term. PCCM will sign off.   PAST MEDICAL HISTORY   Past Medical History:  Diagnosis Date   Achalasia    Barrett's esophagus with dysplasia    Diabetes mellitus without complication (HCC)    GERD (gastroesophageal reflux disease)    Hyperlipidemia    Hypertension    Kidney cancer, primary, with metastasis from kidney to other site [facility])    Son   MRSA infection    lung   Renal insufficiency    Thyroid disease       SURGICAL HISTORY   Past Surgical History:  Procedure Laterality Date   ABDOMINAL HYSTERECTOMY     complete   achalasia     APPENDECTOMY     HIATAL HERNIA REPAIR     KNEE ARTHROPLASTY Right 04/01/2016   Procedure: COMPUTER ASSISTED TOTAL KNEE ARTHROPLASTY;  Surgeon: Dereck Leep, MD;  Location: [facility] ORS;  Service: Orthopedics;  Laterality: Right;   KNEE ARTHROPLASTY Left 11/30/2016   Procedure: COMPUTER ASSISTED TOTAL KNEE ARTHROPLASTY;  Surgeon: Dereck Leep, MD;  Location: [facility] ORS;  Service: Orthopedics;  Laterality: Left;   PARATHYROIDECTOMY     THYROID EXPLORATION     TONSILLECTOMY       FAMILY HISTORY   Family History  Problem Relation Age of Onset   Heart disease Mother    Diabetes Father    Lung cancer Sister    Diabetes Sister    Stomach cancer Brother    CAD Brother    Kidney cancer Son      SOCIAL HISTORY   Social History   Tobacco Use   Smoking status: Former    Packs/day: 0.25    Years: 1.00    Total pack years: 0.25    Types: Cigarettes    Quit date: 10/13/1962    Years since quitting: 59.4   Smokeless tobacco: Never   Tobacco comments:    smoked one  pack a week for 1 year - 55 years ago  Vaping Use   Vaping Use: Never used  Substance Use Topics   Alcohol use: No   Drug use: No     MEDICATIONS    Home Medication:    Current Medication:  Current Facility-Administered Medications:    acetaminophen (TYLENOL) tablet 650 mg, 650 mg, Oral, Q6H PRN, 650 mg at 03/17/22 0929 **OR** acetaminophen (TYLENOL) suppository 650 mg, 650 mg, Rectal, Q6H PRN, Cox, Amy N, DO   acetaminophen (TYLENOL) tablet 650 mg, 650 mg, Oral, Q6H PRN **OR** acetaminophen (TYLENOL) suppository 650 mg, 650 mg, Rectal, Q6H PRN, Fritzi Mandes, MD   glycopyrrolate (ROBINUL) tablet 1 mg, 1 mg, Oral, Q4H PRN **OR** glycopyrrolate (ROBINUL) injection 0.2 mg, 0.2 mg, Subcutaneous, Q4H PRN **OR** glycopyrrolate (ROBINUL) injection 0.2 mg, 0.2 mg, Intravenous, Q4H PRN,  Fritzi Mandes, MD   midazolam (VERSED) injection 2-4 mg, 2-4 mg, Intravenous, Q4H PRN, Fritzi Mandes, MD   morphine (PF) 2 MG/ML injection 2-4 mg, 2-4 mg, Intravenous, Q30 min PRN, Fritzi Mandes, MD, 2 mg at 03/17/22 1535   ondansetron (ZOFRAN) tablet 4 mg, 4 mg, Oral, Q6H PRN **OR** ondansetron (ZOFRAN) injection 4 mg, 4 mg, Intravenous, Q6H PRN, Cox, Amy N, DO   polyvinyl alcohol (LIQUIFILM TEARS) 1.4 % ophthalmic solution 1 drop, 1 drop, Both Eyes, QID PRN, Fritzi Mandes, MD    ALLERGIES   Procaine     REVIEW OF SYSTEMS    Review of Systems:  Gen:  Denies  fever, sweats, chills weigh loss  HEENT: Denies blurred vision, double vision, ear pain, eye pain, hearing loss, nose bleeds, sore throat Cardiac:  No dizziness, chest pain or heaviness, chest tightness,edema Resp:   reports dyspnea chronically  Gi: Denies swallowing difficulty, stomach pain, nausea or vomiting, diarrhea, constipation, bowel incontinence Gu:  Denies bladder incontinence, burning urine Ext:   Denies Joint pain, stiffness or swelling Skin: Denies  skin rash, easy bruising or bleeding or hives Endoc:  Denies polyuria, polydipsia , polyphagia or weight change Psych:   Denies depression, insomnia or hallucinations   Other:  All other systems negative   VS: BP (!) 120/58 (BP Location: Right Arm)   Pulse 66   Temp (!) 97.5 F (36.4 C) (Axillary)   Resp 19   Ht '5\' 4"'$  (1.626 m)   Wt 54.4 kg   SpO2 91%   BMI 20.60 kg/m      PHYSICAL EXAM    GENERAL:NAD, no fevers, chills, no weakness no fatigue HEAD: Normocephalic, atraumatic.  EYES: Pupils equal, round, reactive to light. Extraocular muscles intact. No scleral icterus.  MOUTH: Moist mucosal membrane. Dentition intact. No abscess noted.  EAR, NOSE, THROAT: Clear without exudates. No external lesions.  NECK: Supple. No thyromegaly. No nodules. No JVD.  PULMONARY: decreased breath sounds with mild rhonchi worse at bases bilaterally.  CARDIOVASCULAR: S1 and  S2. Regular rate and rhythm. No murmurs, rubs, or gallops. No edema. Pedal pulses 2+ bilaterally.  GASTROINTESTINAL: Soft, nontender, nondistended. No masses. Positive bowel sounds. No hepatosplenomegaly.  MUSCULOSKELETAL: No swelling, clubbing, or edema. Range of motion full in all extremities.  NEUROLOGIC: Cranial nerves II through XII are intact. No gross focal neurological deficits. Sensation intact. Reflexes intact.  SKIN: No ulceration, lesions, rashes, or cyanosis. Skin warm and dry. Turgor intact.  PSYCHIATRIC: Mood, affect within normal limits. The patient is awake, alert and oriented x 3. Insight, judgment intact.       IMAGING     ASSESSMENT/PLAN  Bilateral non-cystic fibrosis bronchiectasis with acute exacerbation       - Appears to be at least 89 years old with overlying acute exacerbation        - There is a hx of hiatal hernia with achalasia and query regarding chronic microaspiration with resultant bronchiectatic lungs, additional etiologies on differential include atypical mycobacterial infection as well as hypogammaglobulinemia.         -will upgrade Rocephin to Cefepime due to increased efficacy and continue with zithromax      - Mucinex to help patient thin out phlegm and expectorate inspissated mucus   COVID19 infection     - ferritin and CRP trend    - as per [facility]            Thank you for allowing me to participate in the care of this patient.   Patient/Family are satisfied with care plan and all questions have been answered.    Provider disclosure: Patient with at least one acute or chronic illness or injury that poses a threat to life or bodily function and is being managed actively during this encounter.  All of the below services have been performed independently by signing provider:  review of prior documentation from internal and or external health records.  Review of previous and current lab results.  Interview and comprehensive assessment  during patient visit today. Review of current and previous chest radiographs/CT scans. Discussion of management and test interpretation with health care team and patient/family.   This document was prepared using Dragon voice recognition software and may include unintentional dictation errors.     Ottie Glazier, M.D.  Division of Pulmonary & Critical Care Medicine

## 2022-03-18 DIAGNOSIS — J189 Pneumonia, unspecified organism: Secondary | ICD-10-CM | POA: Diagnosis not present

## 2022-03-18 DIAGNOSIS — R531 Weakness: Principal | ICD-10-CM

## 2022-03-18 DIAGNOSIS — Z515 Encounter for palliative care: Secondary | ICD-10-CM

## 2022-03-18 DIAGNOSIS — R918 Other nonspecific abnormal finding of lung field: Secondary | ICD-10-CM | POA: Diagnosis not present

## 2022-03-18 DIAGNOSIS — A419 Sepsis, unspecified organism: Secondary | ICD-10-CM | POA: Diagnosis not present

## 2022-03-18 MED ORDER — MORPHINE SULFATE (CONCENTRATE) 20 MG/ML PO SOLN: 5.0000 mg | ORAL | 0 refills | Status: AC | PRN

## 2022-03-18 MED ORDER — LORAZEPAM 0.5 MG PO TABS: 0.5000 mg | ORAL_TABLET | ORAL | 0 refills | Status: DC | PRN

## 2022-03-18 MED ORDER — MORPHINE SULFATE (CONCENTRATE) 20 MG/ML PO SOLN: 5.0000 mg | ORAL | 0 refills | Status: DC | PRN

## 2022-03-18 MED ORDER — LORAZEPAM 0.5 MG PO TABS: 0.5000 mg | ORAL_TABLET | ORAL | 0 refills | Status: AC | PRN

## 2022-03-18 MED ORDER — CHLORHEXIDINE GLUCONATE CLOTH 2 % EX PADS
6.0000 | MEDICATED_PAD | Freq: Every day | CUTANEOUS | Status: DC
  Administered 2022-03-18: 6 via TOPICAL

## 2022-03-18 NOTE — TOC Initial Note (Signed)
Transition of Care [facility]) - Initial/Assessment Note    Patient Details  Name: Deanna Bird MRN: [id number] Date of Birth: 09/17/1928  Transition of Care [facility]) CM/SW Contact:    Beverly Sessions, RN Phone Number: 03/18/2022, 10:17 AM  Clinical Narrative:                  Notified that patient will be discharing today home with hospice MD had made referral to [facility], and [place] with [employer] facilitated discharge and EMS transport  EMS packet and DNR on chart       Patient Goals and CMS Choice            Expected Discharge Plan and Services         Expected Discharge Date: 03/18/22                                    Prior Living Arrangements/Services                       Activities of Daily Living Home Assistive Devices/Equipment: Eyeglasses ADL Screening (condition at time of admission) Patient's cognitive ability adequate to safely complete daily activities?: Yes Is the patient deaf or have difficulty hearing?: Yes Does the patient have difficulty seeing, even when wearing glasses/contacts?: No Does the patient have difficulty concentrating, remembering, or making decisions?: No Patient able to express need for assistance with ADLs?: Yes Does the patient have difficulty dressing or bathing?: Yes Independently performs ADLs?: No Communication: Independent Dressing (OT): Needs assistance Is this a change from baseline?: Change from baseline, expected to last <3days Grooming: Needs assistance Is this a change from baseline?: Change from baseline, expected to last <3 days Feeding: Independent Bathing: Needs assistance Is this a change from baseline?: Change from baseline, expected to last <3 days Toileting: Independent In/Out Bed: Needs assistance Is this a change from baseline?: Change from baseline, expected to last <3 days Walks in Home: Independent with device (comment) (cane) Does the patient have difficulty  walking or climbing stairs?: Yes Weakness of Legs: Both Weakness of Arms/Hands: None  Permission Sought/Granted                  Emotional Assessment              Admission diagnosis:  Hypoxemia [R09.02] Generalized weakness [R53.1] Severe sepsis ([place]) [A41.9, R65.20] Pneumonia due to infectious organism, unspecified laterality, unspecified part of lung [J18.9] Diarrhea, unspecified type [R19.7] Patient Active Problem List   Diagnosis Date Noted   Generalized weakness 03/18/2022   Hospice care patient 03/18/2022   Severe sepsis ([place]) 03/15/2022   Acute respiratory failure with hypoxia ([place]) 03/15/2022   Diarrhea 03/15/2022   Hyponatremia 03/15/2022   COVID-19 virus infection 03/15/2022   Lung mass 01/06/2018   CRF (chronic renal failure), stage 3 (moderate) ([place]) 12/21/2017   Purpura senilis ([place]) 11/11/2017   Pneumonia 11/03/2017   Elevated WBC count 10/06/2017   Lumbar radiculopathy 06/24/2017   Waddling gait 03/19/2017   Gait abnormality 02/26/2017   Advanced care planning/counseling discussion 08/04/2016   S/P total knee replacement using cement, left 04/01/2016   Primary osteoarthritis of right knee 03/19/2016   Hyperlipidemia 11/19/2014   Diabetes mellitus without complication ([place]) [id number]   Nonspecific abnormal results of thyroid function study 07/18/2014   Hypertension 07/18/2014   Dysphagia 05/11/2013   PCP:  Baxter Hire, MD Pharmacy:  [place], [place] - [place] [address] [place] [address] Phone: [phone] Fax: [phone]     Social Determinants of Health (SDOH) Social History: SDOH Screenings   Food Insecurity: No Food Insecurity (03/15/2022)  Housing: Low Risk  (03/15/2022)  Transportation Needs: No Transportation Needs (03/15/2022)  Utilities: Not At Risk (03/15/2022)  Financial Resource Strain: Low Risk  (07/21/2017)  Physical Activity: Inactive (07/21/2017)  Social Connections: Moderately  Integrated (07/21/2017)  Stress: No Stress Concern Present (07/21/2017)  Tobacco Use: Medium Risk (03/16/2022)   SDOH Interventions:     Readmission Risk Interventions     No data to display

## 2022-03-18 NOTE — Discharge Summary (Signed)
Physician Discharge Summary   Patient: Deanna Bird MRN: [id number] DOB: 05-May-1928  Admit date:     03/15/2022  Discharge date: 03/18/22  Discharge Physician: Max Sane   PCP: Baxter Hire, MD   Recommendations at discharge:    Hospice at home  Discharge Diagnoses: Principal Problem:   Severe sepsis [facility]) Active Problems:   Acute respiratory failure with hypoxia ([place])   Hyponatremia   Hyperlipidemia   Hypertension   Pneumonia   Lung mass   Diarrhea   COVID-19 virus infection   Generalized weakness   Hospice care patient  Hospital Course: Deanna Bird 89 year old female with history of hypertension, who presents emergency department for chief concerns of generalized weakness and diarrhea.  Vitals in the ED showed temperature of 97.4, respiration rate of 13, heart rate 68, blood pressure 157/74, SpO2 of 95% on 2 L nasal cannula.  Serum sodium is 123, potassium 3.9, chloride 89, bicarb 23, BUN of 43, serum creatinine 1.24, EGFR 41, nonfasting blood glucose 101, WBC 21.3, platelets of 171, hemoglobin 12.7.  Lactic is elevated at 2.3.  COVID PCR was positive.  ED treatment: Azithromycin 500 mg IV one-time dose, ceftriaxone 1 g IV, sodium chloride 1 L bolus.  Assessment and Plan: * Severe sepsis ([place]) - POA Patient meets criteria for severe sepsis with elevated lactic acid, markedly elevated leukocytosis, increased respiration rate, organ involvement is pulmonary  Hyponatremia Acute respiratory failure with hypoxia (HCC) spO2 was 88% on RA, presumed secondary to multi focal pneumonia and COVID-19 infection  Diarrhea Pneumonia Hypertension  Goals of Care - Hospice at Home         Disposition: Hospice care Diet recommendation:  Discharge Diet Orders (From admission, onward)     Start     Ordered   03/18/22 0000  Diet - low sodium heart healthy        03/18/22 0951           Carb modified diet DISCHARGE MEDICATION: Allergies as of 03/18/2022        Reactions   Procaine Palpitations, Other (See Comments)   Rapid heartbeat        Medication List     STOP taking these medications    Cholecalciferol 250 MCG (10000 UT) Tabs   fenofibrate micronized 134 MG capsule Commonly known as: LOFIBRA   ferrous sulfate 325 (65 FE) MG tablet   hydrochlorothiazide 25 MG tablet Commonly known as: HYDRODIURIL   hydrocortisone 2.5 % rectal cream Commonly known as: Anusol-HC   ipratropium 0.03 % nasal spray Commonly known as: ATROVENT   losartan-hydrochlorothiazide 100-25 MG tablet Commonly known as: HYZAAR   magnesium oxide 400 (240 Mg) MG tablet Commonly known as: MAG-OX   metoprolol succinate 100 MG 24 hr tablet Commonly known as: TOPROL-XL   OMEGA-3 FATTY ACIDS PO       TAKE these medications    LORazepam 0.5 MG tablet Commonly known as: ATIVAN Take 1 tablet (0.5 mg total) by mouth every 4 (four) hours as needed for up to 3 days for anxiety. May crush, mix with water and give sublingually if needed.   morphine 20 MG/ML concentrated solution Commonly known as: ROXANOL Take 0.25 mLs (5 mg total) by mouth every 4 (four) hours as needed for up to 3 days for severe pain, breakthrough pain, anxiety, shortness of breath or moderate pain. May give sublingually if needed.        Discharge Exam: Filed Weights   03/15/22 1002  Weight: 54.4 kg  GENERAL:  89 y.o.-year-old patient with moderate acute distress. Seem to be actively dying LUNGS: decreased breath sounds bilaterally, no wheezing CARDIOVASCULAR: S1, S2 normal. No murmur   ABDOMEN: Soft, nontender, nondistended. Bowel sounds present.  EXTREMITIES: No  edema b/l.    NEUROLOGIC: nonfocal  patient is lethargic unable to have any meaningful conversation.  Condition at discharge: critical  The results of significant diagnostics from this hospitalization (including imaging, microbiology, ancillary and laboratory) are listed below for reference.   Imaging  Studies: CT CHEST W CONTRAST  Result Date: 03/16/2022 CLINICAL DATA:  Abnormal x-ray with lung opacities. Loss of cough. COVID positive EXAM: CT CHEST WITH CONTRAST TECHNIQUE: Multidetector CT imaging of the chest was performed during intravenous contrast administration. RADIATION DOSE REDUCTION: This exam was performed according to the departmental dose-optimization program which includes automated exposure control, adjustment of the mA and/or kV according to patient size and/or use of iterative reconstruction technique. CONTRAST:  20m OMNIPAQUE IOHEXOL 300 MG/ML  SOLN COMPARISON:  Radiographs 03/15/2022 and CT chest 06/16/2018 FINDINGS: Cardiovascular: Borderline cardiomegaly. Coronary artery and aortic atherosclerotic calcification. Trace pericardial effusion. Mediastinum/Nodes: Patulous esophagus containing fluid with air-fluid level near the thoracic inlet. Small hiatal hernia. Multiple enlarged mediastinal and right hilar nodes for example 1.5 cm pretracheal node (2/51) and 1.5 cm right hilar node (2/67). Lungs/Pleura: Diffuse bronchial wall thickening. Diffuse bilateral centrilobular micro nodules and tree-in-bud opacities. Mild diffuse interlobular septal thickening. Small left-greater-than-right pleural effusions. Mucous plugging with partial atelectasis of the right middle lobe. More nodular consolidative opacities in the right upper lobe. Focal subpleural consolidation in the right upper lobe medially (4/31) measures 3.4 x 2.7 cm in corresponds to the apical density on recent radiograph. Upper Abdomen: No acute abnormality. Musculoskeletal: No chest wall abnormality. No acute or significant osseous findings. IMPRESSION: 1. Pulmonary parenchymal findings compatible with small airway infection/inflammation possibly due to aspiration given large fluid column in the esophagus. 2. Focal subpleural consolidation in the right upper lobe medially measures 3.4 x 2.7 cm and corresponds to the apical density on  recent radiograph. While this could represent a focal area of infection, underlying malignancy is not excluded. Short interval follow-up in 4-6 weeks is recommended after treatment. 3. Small left-greater-than-right pleural effusions. 4. Patulous esophagus containing fluid with air-fluid level near the thoracic inlet. Correlate with signs/symptoms of gastroesophageal reflux. 5. Indeterminate mediastinal and right hilar adenopathy. Continued attention on follow-up. 6. Coronary artery calcification. Aortic Atherosclerosis (ICD10-I70.0). Electronically Signed   By: TPlacido SouM.D.   On: 03/16/2022 22:00   CT ABDOMEN PELVIS WO CONTRAST  Result Date: 03/15/2022 CLINICAL DATA:  Generalized abdominal pain and 3 day history of diarrhea. EXAM: CT ABDOMEN AND PELVIS WITHOUT CONTRAST TECHNIQUE: Multidetector CT imaging of the abdomen and pelvis was performed following the standard protocol without IV contrast. RADIATION DOSE REDUCTION: This exam was performed according to the departmental dose-optimization program which includes automated exposure control, adjustment of the mA and/or kV according to patient size and/or use of iterative reconstruction technique. COMPARISON:  Chest CT 06/16/2018 FINDINGS: Lower chest: Interval development of multiple nodular lesions in the lung bases, most prominently in the left lower lobe with tree-in-bud configuration in the anterior left base. Small left and tiny right pleural effusions evident. Hiatal hernia noted. Hepatobiliary: No suspicious focal abnormality in the liver on this study without intravenous contrast. There is no evidence for gallstones, gallbladder wall thickening, or pericholecystic fluid. No intrahepatic or extrahepatic biliary dilation. Pancreas: No focal mass lesion. No dilatation of the main  duct. No intraparenchymal cyst. No peripancreatic edema. Spleen: No splenomegaly. No focal mass lesion. Adrenals/Urinary Tract: No adrenal nodule or mass. No stones are  seen in the right kidney. 5 mm nonobstructing stone noted upper pole left kidney. 1.8 x 1.2 x 1.5 cm staghorn calculus noted lower pole left kidney. No left hydronephrosis. No evidence for hydroureter. The urinary bladder appears normal for the degree of distention. Stomach/Bowel: Moderate hiatal hernia. Duodenum is normally positioned as is the ligament of Treitz. No small bowel wall thickening. No small bowel dilatation. The terminal ileum is normal. The appendix is not well visualized, but there is no edema or inflammation in the region of the cecum. No gross colonic mass. No colonic wall thickening. Vascular/Lymphatic: There is moderate atherosclerotic calcification of the abdominal aorta without aneurysm. There is no gastrohepatic or hepatoduodenal ligament lymphadenopathy. No retroperitoneal or mesenteric lymphadenopathy. No pelvic sidewall lymphadenopathy. Reproductive: Hysterectomy.  There is no adnexal mass. Other: Trace free fluid noted in the pelvis Musculoskeletal: No worrisome lytic or sclerotic osseous abnormality. Diffuse body wall edema evident. IMPRESSION: 1. Interval development of multiple nodular lesions in the lung bases, most prominently in the left lower lobe with tree-in-bud configuration in the anterior left base. Imaging features are most suggestive of an infectious/inflammatory etiology. Dedicated CT chest without contrast recommended for definitive characterization and to establish follow-up recommendations. 2. Small left and tiny right pleural effusions. 3. Moderate hiatal hernia. 4. Nonobstructing left renal stones. 5. Diffuse body wall edema with trace free fluid in the pelvis. 6.  Aortic Atherosclerosis (ICD10-I70.0). Electronically Signed   By: Misty Stanley M.D.   On: 03/15/2022 11:47   DG Chest Port 1 View  Result Date: 03/15/2022 CLINICAL DATA:  Sepsis EXAM: PORTABLE CHEST 1 VIEW COMPARISON:  11/03/2017 FINDINGS: Normal heart size. Aortic atherosclerosis. Extensive patchy  airspace opacities bilaterally, worse within the right lung. New focal density at the medial right lung apex measuring approximately 3.7 x 3.1 cm. No pneumothorax. IMPRESSION: 1. Extensive patchy airspace opacities bilaterally, worse within the right lung, most suspicious for multifocal pneumonia. 2. New focal density at the medial right lung apex measuring approximately 3.7 x 3.1 cm. Differential includes loculated pleural fluid or mass lesion. Follow-up CT of the chest, preferably with contrast, is suggested to further evaluate. Electronically Signed   By: Davina Poke D.O.   On: 03/15/2022 10:49    Microbiology: Results for orders placed or performed during the hospital encounter of 03/15/22  Resp panel by RT-PCR (RSV, Flu A&B, Covid) Anterior Nasal Swab     Status: Abnormal   Collection Time: 03/15/22 10:08 AM   Specimen: Anterior Nasal Swab  Result Value Ref Range Status   SARS Coronavirus 2 by RT PCR POSITIVE (A) NEGATIVE Final    Comment: (NOTE) SARS-CoV-2 target nucleic acids are DETECTED.  The SARS-CoV-2 RNA is generally detectable in upper respiratory specimens during the acute phase of infection. Positive results are indicative of the presence of the identified virus, but do not rule out bacterial infection or co-infection with other pathogens not detected by the test. Clinical correlation with patient history and other diagnostic information is necessary to determine patient infection status. The expected result is Negative.  Fact Sheet for Patients: EntrepreneurPulse.com.au  Fact Sheet for Healthcare Providers: IncredibleEmployment.be  This test is not yet approved or cleared by the [place] FDA and  has been authorized for detection and/or diagnosis of SARS-CoV-2 by FDA under an Emergency Use Authorization (EUA).  This EUA will remain in effect (  meaning this test can be used) for the duration of  the COVID-19 declaration under  Section 564(b)(1) of the A ct, 21 U.S.C. section 360bbb-3(b)(1), unless the authorization is terminated or revoked sooner.     Influenza A by PCR NEGATIVE NEGATIVE Final   Influenza B by PCR NEGATIVE NEGATIVE Final    Comment: (NOTE) The Xpert Xpress SARS-CoV-2/FLU/RSV plus assay is intended as an aid in the diagnosis of influenza from Nasopharyngeal swab specimens and should not be used as a sole basis for treatment. Nasal washings and aspirates are unacceptable for Xpert Xpress SARS-CoV-2/FLU/RSV testing.  Fact Sheet for Patients: EntrepreneurPulse.com.au  Fact Sheet for Healthcare Providers: IncredibleEmployment.be  This test is not yet approved or cleared by the [place] FDA and has been authorized for detection and/or diagnosis of SARS-CoV-2 by FDA under an Emergency Use Authorization (EUA). This EUA will remain in effect (meaning this test can be used) for the duration of the COVID-19 declaration under Section 564(b)(1) of the Act, 21 U.S.C. section 360bbb-3(b)(1), unless the authorization is terminated or revoked.     Resp Syncytial Virus by PCR NEGATIVE NEGATIVE Final    Comment: (NOTE) Fact Sheet for Patients: EntrepreneurPulse.com.au  Fact Sheet for Healthcare Providers: IncredibleEmployment.be  This test is not yet approved or cleared by the [place] FDA and has been authorized for detection and/or diagnosis of SARS-CoV-2 by FDA under an Emergency Use Authorization (EUA). This EUA will remain in effect (meaning this test can be used) for the duration of the COVID-19 declaration under Section 564(b)(1) of the Act, 21 U.S.C. section 360bbb-3(b)(1), unless the authorization is terminated or revoked.  Performed at [facility], [address] [place], [place] [address]   Blood Culture (routine x 2)     Status: None (Preliminary result)   Collection Time: 03/15/22 10:32 AM    Specimen: BLOOD  Result Value Ref Range Status   Specimen Description BLOOD LEFT FOREARM  Final   Special Requests   Final    BOTTLES DRAWN AEROBIC AND ANAEROBIC Blood Culture results may not be optimal due to an inadequate volume of blood received in culture bottles   Culture   Final    NO GROWTH 3 DAYS Performed at [facility], [address], [place], [place] [address]    Report Status PENDING  Incomplete  Blood Culture (routine x 2)     Status: None (Preliminary result)   Collection Time: 03/15/22 10:32 AM   Specimen: BLOOD  Result Value Ref Range Status   Specimen Description BLOOD RIGHT FOREARM  Final   Special Requests   Final    BOTTLES DRAWN AEROBIC AND ANAEROBIC Blood Culture results may not be optimal due to an inadequate volume of blood received in culture bottles   Culture   Final    NO GROWTH 3 DAYS Performed at [facility], [address] [place], [place] [address]    Report Status PENDING  Incomplete  MRSA Next Gen by PCR, Nasal     Status: None   Collection Time: 03/15/22  1:08 PM   Specimen: Nasal Mucosa; Nasal Swab  Result Value Ref Range Status   MRSA by PCR Next Gen NOT DETECTED NOT DETECTED Final    Comment: (NOTE) The GeneXpert MRSA Assay (FDA approved for NASAL specimens only), is one component of a comprehensive MRSA colonization surveillance program. It is not intended to diagnose MRSA infection nor to guide or monitor treatment for MRSA infections. Test performance is not FDA approved  in patients less than 87 years old. Performed at [facility], [address] [place], [place] [address]     Labs: CBC: Recent Labs  Lab 03/15/22 1022 03/16/22 0315  WBC 21.3* 21.5*  NEUTROABS 19.6*  --   HGB 12.7 12.4  HCT 39.0 38.1  MCV 93.3 91.8  PLT 171 [id number]   Basic Metabolic Panel: Recent Labs  Lab 03/15/22 1022 03/15/22 1308 03/15/22 1923 03/15/22 2126 03/15/22 2305 03/16/22 0123 03/16/22 0315  NA 123*   < >  122* 122* 122* 125* 123*  K 3.9  --   --   --   --   --  4.3  CL 89*  --   --   --   --   --  94*  CO2 23  --   --   --   --   --  21*  GLUCOSE 101*  --   --   --   --   --  132*  BUN 43*  --   --   --   --   --  41*  CREATININE 1.24*  --   --   --   --   --  1.25*  CALCIUM 7.5*  --   --   --   --   --  7.0*  MG 1.8  --   --   --   --   --   --    < > = values in this interval not displayed.   Liver Function Tests: Recent Labs  Lab 03/15/22 1022  AST 41  ALT 18  ALKPHOS 41  BILITOT 1.0  PROT 6.0*  ALBUMIN 2.2*   CBG: No results for input(s): "GLUCAP" in the last 168 hours.  Discharge time spent: greater than 30 minutes.  Signed: Max Sane, MD [facility] 03/18/2022

## 2022-03-18 NOTE — Progress Notes (Signed)
IV REMOVED AND PATIENT DRESSED IN DISCHARGE GOWN. PATIENT DISCHARGED TO THE CARE OF EMS WITH SHALLOW SLOW BREATHING, COLD HANDS AND FEET AND NON RESPONSIVE TO TOUCH OR VERBAL STIMULI.  Deanna Bird [name]

## 2022-03-18 NOTE — Progress Notes (Signed)
Manufacturing engineer [facility]) Hospital Liaison Note   MSW confirmed with family that they are prepared for patient to return home with [facility] services today via EMS.Address verified and is correct in the chart. [facility] notified by MSW.   Please send signed and completed DNR home with patient/family. Please provide prescriptions at discharge as needed to ensure ongoing symptom management.    [facility] information and contact numbers given to family & above information shared with TOC.   Please call with any questions/concerns.    Thank you for the opportunity to participate in this patient's care.   Phillis Haggis, MSW [facility] Liaison  [phone]

## 2022-03-18 NOTE — Plan of Care (Signed)
DISCHARGING [place].  Deanna Bird

## 2022-03-20 LAB — CULTURE, BLOOD (ROUTINE X 2)
Culture: NO GROWTH
Culture: NO GROWTH

## 2022-04-06 DEATH — deceased
